# Patient Record
Sex: Female | Born: 1975
Health system: Southern US, Community
[De-identification: ages and names within clinical notes are randomized; demographics above are authoritative.]

## PROBLEM LIST (undated history)

## (undated) ENCOUNTER — Emergency Department (HOSPITAL_COMMUNITY): Admission: EM | Payer: BC Managed Care – PPO

## (undated) DIAGNOSIS — I2699 Other pulmonary embolism without acute cor pulmonale: Secondary | ICD-10-CM

## (undated) DIAGNOSIS — J45909 Unspecified asthma, uncomplicated: Secondary | ICD-10-CM

## (undated) DIAGNOSIS — R011 Cardiac murmur, unspecified: Secondary | ICD-10-CM

## (undated) DIAGNOSIS — I499 Cardiac arrhythmia, unspecified: Secondary | ICD-10-CM

## (undated) DIAGNOSIS — N051 Unspecified nephritic syndrome with focal and segmental glomerular lesions: Secondary | ICD-10-CM

## (undated) DIAGNOSIS — I10 Essential (primary) hypertension: Secondary | ICD-10-CM

## (undated) DIAGNOSIS — D649 Anemia, unspecified: Secondary | ICD-10-CM

## (undated) DIAGNOSIS — J40 Bronchitis, not specified as acute or chronic: Secondary | ICD-10-CM

## (undated) DIAGNOSIS — K219 Gastro-esophageal reflux disease without esophagitis: Secondary | ICD-10-CM

## (undated) HISTORY — DX: Cardiac murmur, unspecified: R01.1

## (undated) HISTORY — DX: Bronchitis, not specified as acute or chronic: J40

## (undated) HISTORY — PX: TUBAL LIGATION: SHX77

## (undated) HISTORY — PX: ECTOPIC PREGNANCY SURGERY: SHX613

## (undated) HISTORY — DX: Anemia, unspecified: D64.9

## (undated) HISTORY — DX: Unspecified asthma, uncomplicated: J45.909

## (undated) HISTORY — DX: Essential (primary) hypertension: I10

## (undated) HISTORY — DX: Gastro-esophageal reflux disease without esophagitis: K21.9

## (undated) HISTORY — DX: Cardiac arrhythmia, unspecified: I49.9

## (undated) HISTORY — DX: Unspecified nephritic syndrome with focal and segmental glomerular lesions: N05.1

---

## 2004-03-08 ENCOUNTER — Other Ambulatory Visit: Admission: RE | Admit: 2004-03-08 | Discharge: 2004-03-08 | Payer: Self-pay | Admitting: Obstetrics and Gynecology

## 2005-04-10 ENCOUNTER — Other Ambulatory Visit: Admission: RE | Admit: 2005-04-10 | Discharge: 2005-04-10 | Payer: Self-pay | Admitting: Obstetrics and Gynecology

## 2006-02-08 ENCOUNTER — Emergency Department (HOSPITAL_COMMUNITY): Admission: EM | Admit: 2006-02-08 | Discharge: 2006-02-08 | Payer: Self-pay | Admitting: Emergency Medicine

## 2006-02-08 IMAGING — CR DG CERVICAL SPINE COMPLETE 4+V
6 series · 6 of 6 positions shown · non-contrast
Comparison: none

CLINICAL DATA: Right arm and neck pain.  No trauma. 
 DIAGNOSTIC CERVICAL SPINE ? 6 VIEW:
 There is no evidence of cervical spine fracture or prevertebral soft tissue swelling.  Alignment is normal.  No other significant bone abnormalities are identified.

[w c-spine lat *]
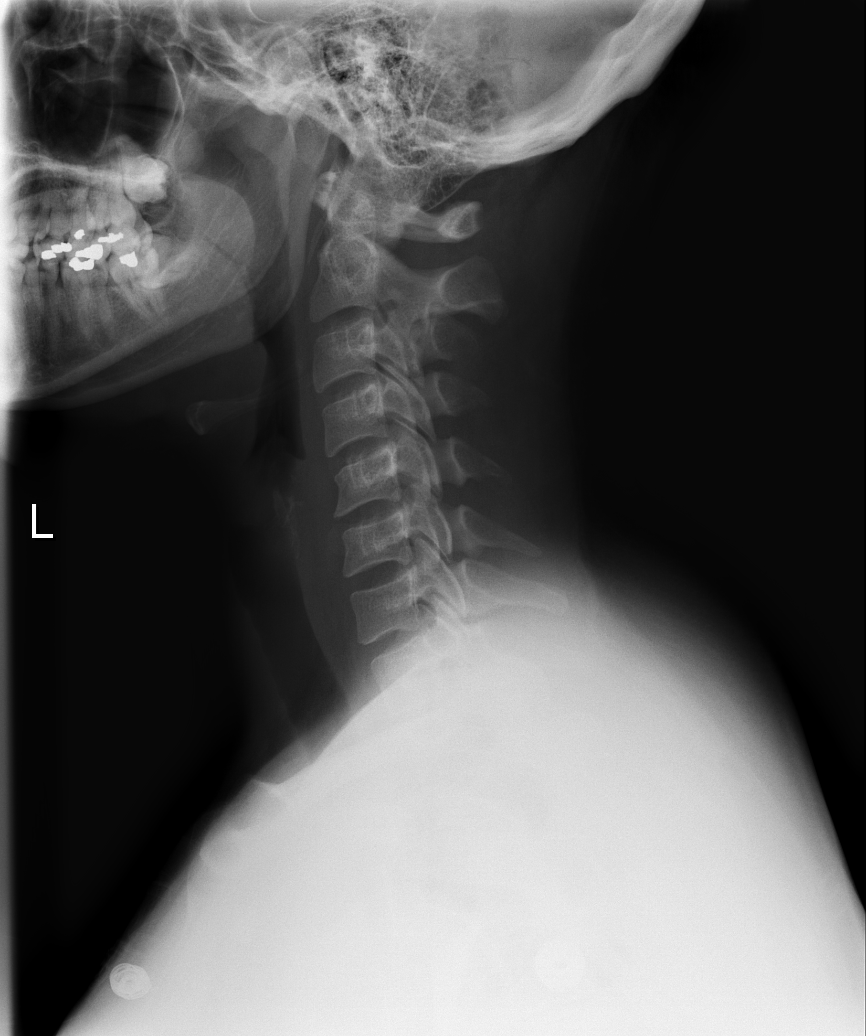

[w c-spine oblique (1 of 2)]
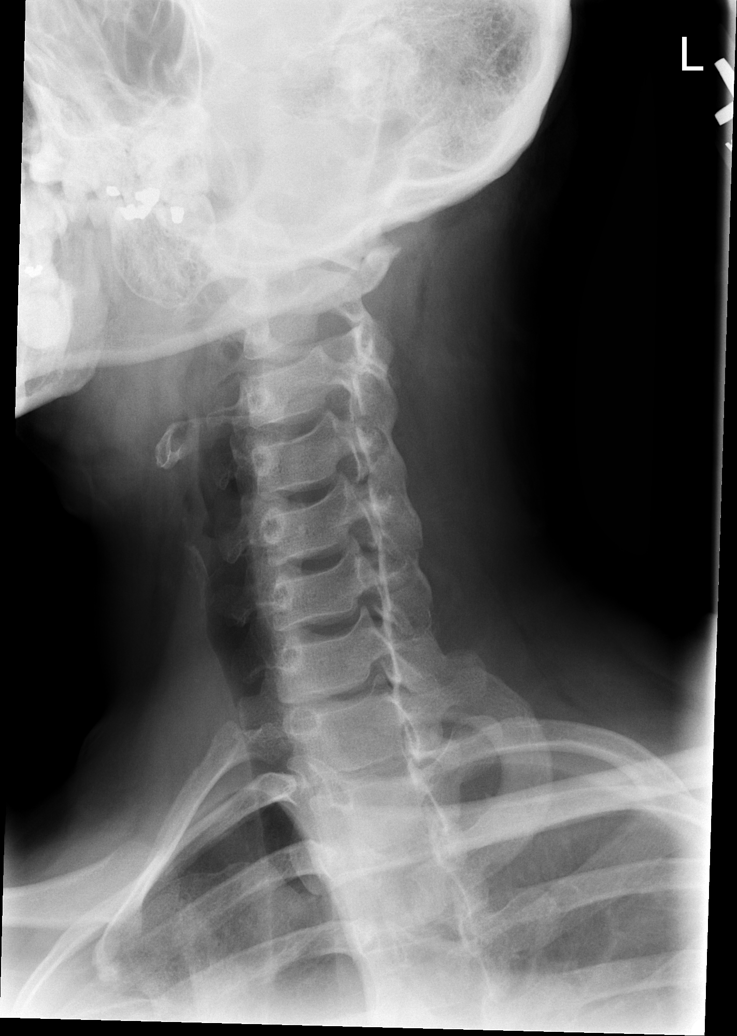

[w c-spine a.p.]
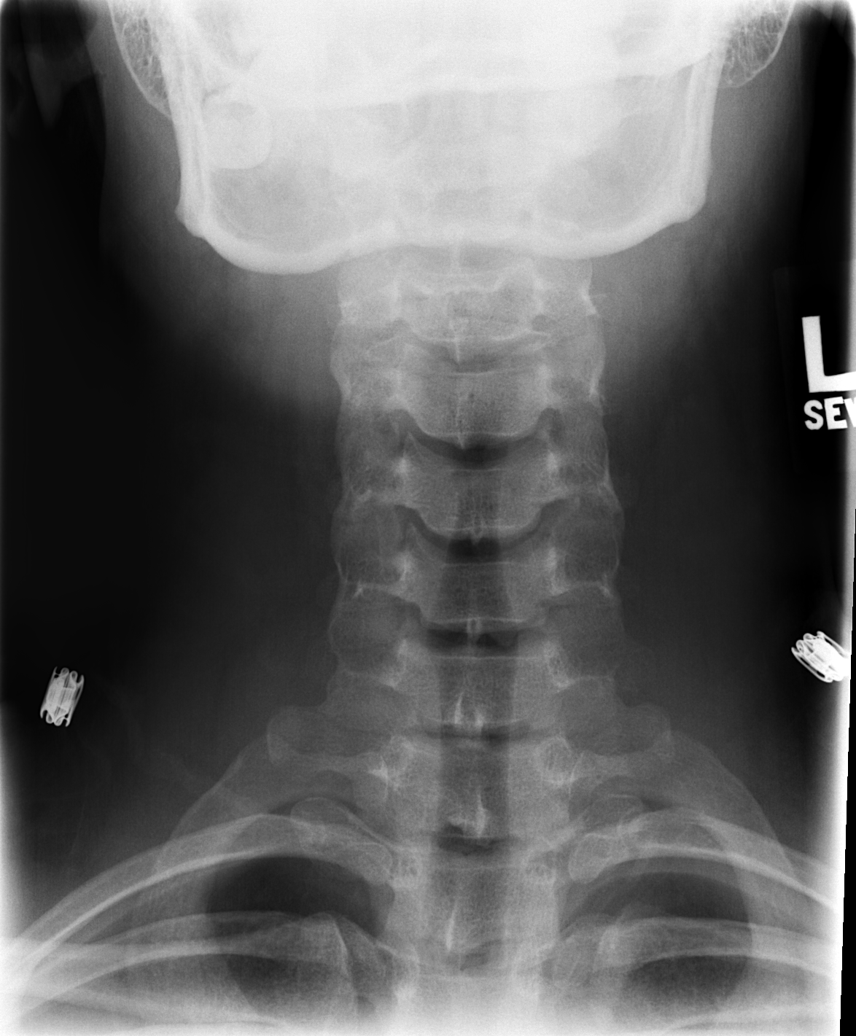

[w c-spine odontoid]
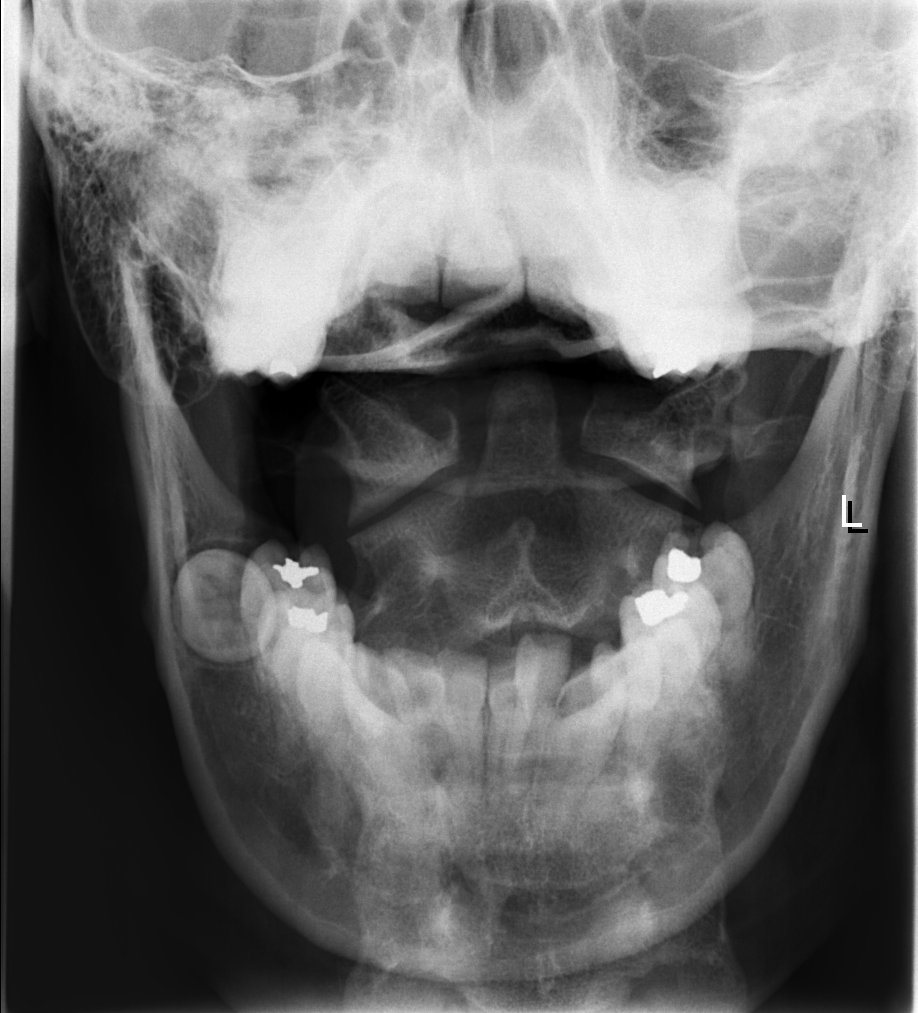

[w swimmers view]
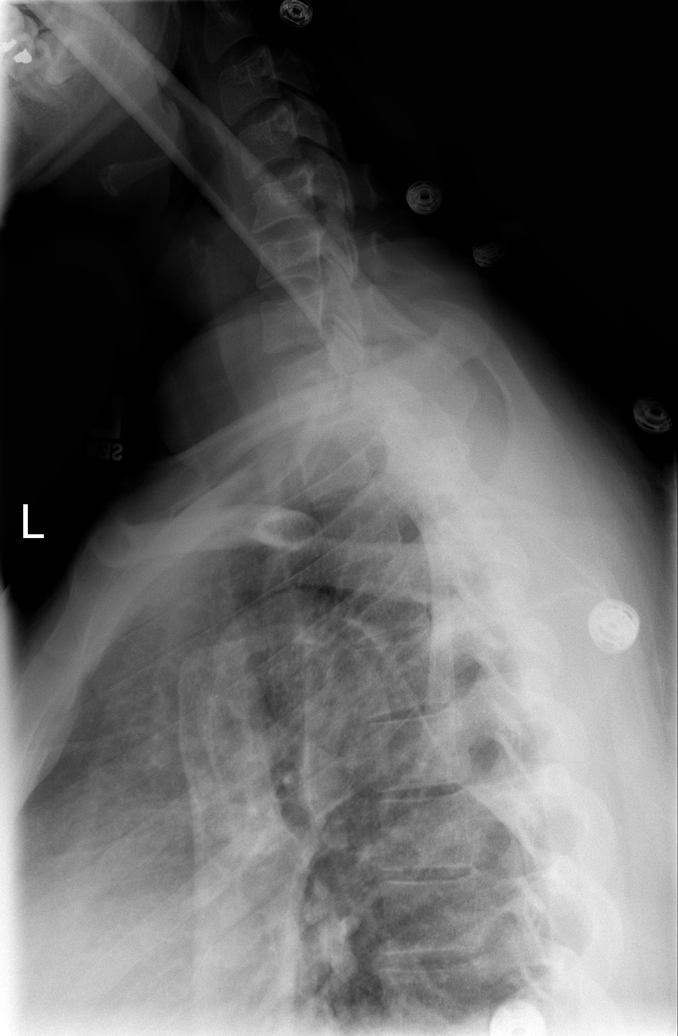

[w c-spine oblique (2 of 2)]
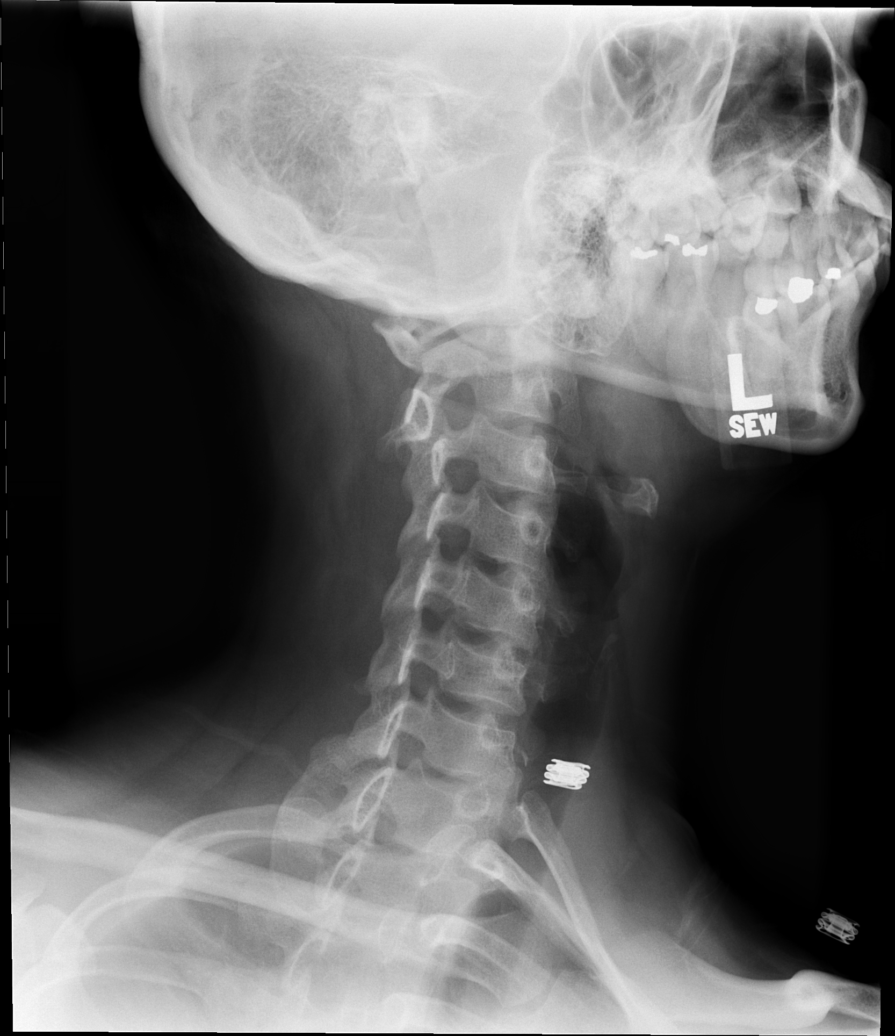

[6 of 6 positions shown; findings below may reference images not displayed]

IMPRESSION: Negative cervical spine radiographs.

## 2007-01-05 ENCOUNTER — Ambulatory Visit (HOSPITAL_COMMUNITY): Admission: RE | Admit: 2007-01-05 | Discharge: 2007-01-05 | Payer: Self-pay | Admitting: Specialist

## 2007-01-05 IMAGING — RF DG HYSTEROGRAM
4 series · 4 of 4 positions shown · IV contrast (omnipaque)
Comparison: none

CLINICAL DATA: Infertility, history of 2 ectopic pregnancies in the past. 
 HYSTEROGRAM:
 Following cleansing of the cervix and vagina with Betadine solution, a hysterosalpingogram was performed using a 5-French hysterosalpingogram catheter and Omnipaque 300 contrast.  The patient tolerated the examination without difficulty.

[Series 1: run · 1 of 1 slices shown (1 of 4)]
[im 1/1]
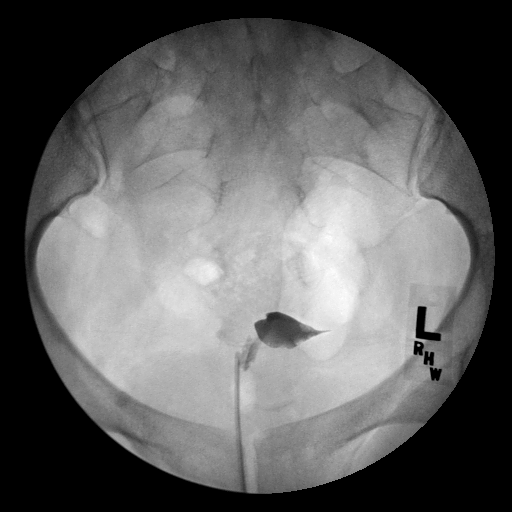

[Series 2: run · 1 of 1 slices shown (2 of 4)]
[im 1/1]
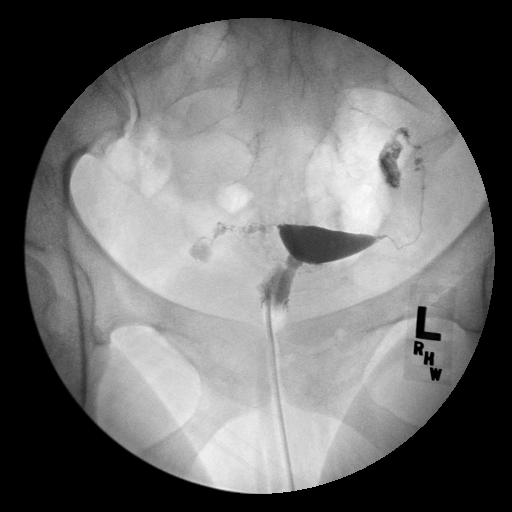

[Series 3: run · 1 of 1 slices shown (3 of 4)]
[im 1/1]
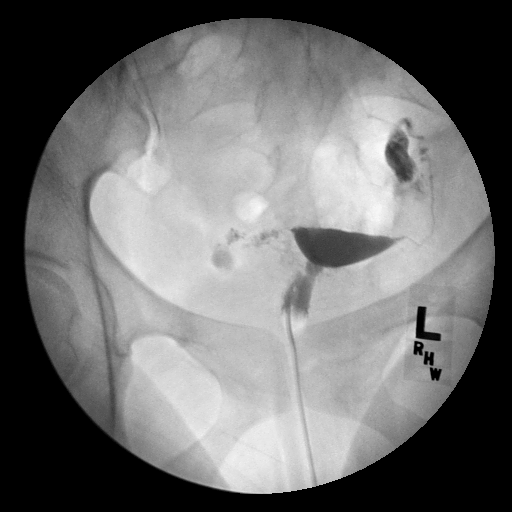

[Series 4: run · 1 of 1 slices shown (4 of 4)]
[im 1/1]
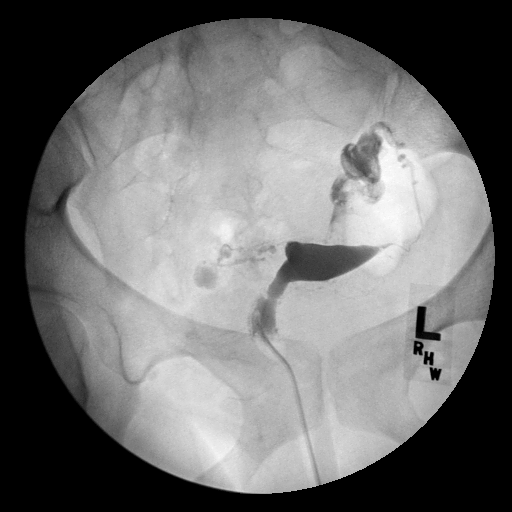

[4 of 4 positions shown; findings below may reference images not displayed]

FINDINGS: The uterus has a normal appearance with no fixed filling defects.  Both fallopian tubes exhibit small diverticula, consistent with salpingitis isthmica nodosa.  There is some spill of contrast from the left fallopian tube, but this does not flow freely and appears to be loculated around the fimbrial portion of the tube.  There is no free spill of contrast from the right tube.
IMPRESSION: 1.  Normal uterus.  
 2.  Salpingitis isthmica nodosa involving both fallopian tubes.  
 3.  The right tube appears to be blocked in its mid segment.  
 4.  There is spill of contrast from the left tube but it is loculated around the fimbrial portion, possibly from scarring vs endometriosis.

## 2007-01-08 ENCOUNTER — Other Ambulatory Visit: Admission: RE | Admit: 2007-01-08 | Discharge: 2007-01-08 | Payer: Self-pay | Admitting: Family Medicine

## 2008-04-04 ENCOUNTER — Other Ambulatory Visit: Admission: RE | Admit: 2008-04-04 | Discharge: 2008-04-04 | Payer: Self-pay | Admitting: Gynecology

## 2008-10-10 ENCOUNTER — Inpatient Hospital Stay (HOSPITAL_COMMUNITY): Admission: AD | Admit: 2008-10-10 | Discharge: 2008-10-10 | Payer: Self-pay | Admitting: Gynecology

## 2008-10-25 ENCOUNTER — Inpatient Hospital Stay (HOSPITAL_COMMUNITY): Admission: AD | Admit: 2008-10-25 | Discharge: 2008-10-25 | Payer: Self-pay | Admitting: Gynecology

## 2008-11-01 ENCOUNTER — Encounter (HOSPITAL_COMMUNITY): Payer: Self-pay | Admitting: Obstetrics and Gynecology

## 2008-11-01 ENCOUNTER — Ambulatory Visit (HOSPITAL_COMMUNITY): Admission: AD | Admit: 2008-11-01 | Discharge: 2008-11-01 | Payer: Self-pay | Admitting: Gynecology

## 2011-01-20 LAB — TYPE AND SCREEN: ABO/RH(D): O POS

## 2011-01-20 LAB — DIFFERENTIAL
Basophils Relative: 1 % (ref 0–1)
Eosinophils Relative: 1 % (ref 0–5)
Lymphocytes Relative: 31 % (ref 12–46)
Lymphs Abs: 2 10*3/uL (ref 0.7–4.0)
Monocytes Relative: 8 % (ref 3–12)
Neutrophils Relative %: 59 % (ref 43–77)

## 2011-01-20 LAB — CBC
HCT: 39.5 % (ref 36.0–46.0)
Hemoglobin: 12.7 g/dL (ref 12.0–15.0)
Hemoglobin: 12.9 g/dL (ref 12.0–15.0)
MCHC: 32.8 g/dL (ref 30.0–36.0)
MCV: 87.8 fL (ref 78.0–100.0)
MCV: 89.2 fL (ref 78.0–100.0)
Platelets: 392 10*3/uL (ref 150–400)
Platelets: 417 10*3/uL — ABNORMAL HIGH (ref 150–400)
WBC: 5.6 10*3/uL (ref 4.0–10.5)
WBC: 6.3 10*3/uL (ref 4.0–10.5)

## 2011-01-20 LAB — PROTIME-INR
INR: 1 (ref 0.00–1.49)
Prothrombin Time: 13.1 seconds (ref 11.6–15.2)

## 2011-01-20 LAB — CREATININE, SERUM
Creatinine, Ser: 0.71 mg/dL (ref 0.4–1.2)
GFR calc non Af Amer: >60 mL/min (ref >60)

## 2011-01-20 LAB — BUN: BUN: 8 mg/dL (ref 6–23)

## 2011-02-18 NOTE — Op Note (Signed)
NAME:  Tiffany Hubbard, Tiffany Hubbard NO.:  [id number]   MEDICAL RECORD NO.:  [id number]          PATIENT TYPE:  AMB   LOCATION:  SDC                           FACILITY:  [facility]   PHYSICIAN:  Zelphia Cairo, MD    DATE OF BIRTH:  10-07-1975   DATE OF PROCEDURE:  DATE OF DISCHARGE:                               OPERATIVE REPORT   DATE OF PROCEDURE:  November 01, 2008   PREOPERATIVE DIAGNOSIS:  Ectopic pregnancy.   POSTOPERATIVE DIAGNOSIS:  1. Hemoperitoneum.  2. Left ectopic pregnancy.  3. Fibroid uterus.   PROCEDURE:  Diagnostic laparoscopy with partial left salpingectomy.  Bilateral tubal ligation with Filshie clips.   SURGEON:  Zelphia Cairo, MD   ANESTHESIA:  General.   URINE OUTPUT:  200 mL.   BLOOD LOSS:  Minimal.   SPECIMEN:  Segment of left fallopian tube to pathology.   COMPLICATIONS:  None.   CONDITION:  To recovery room.   FINDINGS:  1. Left-sided ectopic pregnancy in the mid portion of the fallopian      tube.  2. Pedunculated fibroids, thin filmy adhesions noted surrounding the      liver and the right upper quadrant.   PROCEDURE:  Tiffany Hubbard was taken to the operating room where general  anesthesia was found to be adequate.  She was prepped and draped in  sterile fashion.  She was placed in dorsal lithotomy position using  Allen stirrups and prepped and draped in sterile fashion.  A Foley  catheter was inserted sterilely.  A bivalve speculum was then placed in  the vagina and a single-tooth tenaculum was used to grasp the anterior  lip of the cervix.  A Hulka clamp was placed on the anterior lip of the  cervix for uterine manipulation.  Single-tooth tenaculum and speculum  were removed and our attention was turned to the abdomen.  A small  infraumbilical skin incision was made with a scalpel and this was  extended bluntly to the level of the fascia using a Kelly clamp.  An  optical trocar was then inserted under direct visualization.  Once  intraperitoneal placement was confirmed, CO2 was turned on and the  abdomen and pelvis were insufflated.  A survey of the abdomen and pelvis  were performed, approximately 500 mL of blood was noted in the pelvis.  She was noted to have 2 pedunculated fibroids, one in the posterior  fundal region, the other located on the left anterior uterus.  Both  approximately 2 cm in size.  The patient was then placed in  Trendelenburg position.  A suprapubic incision was made with a scalpel  and a 5-mm trocar was inserted under direct visualization.  A blunt  probe was inserted.  The right ovary and fallopian tube were visualized  and appeared normal.  She had a small amount of adhesions of the omentum  to the right pelvic sidewall.  Left fallopian tube was covered with  blood and clot, approximately 2 cm area of dilation was noted in the mid  portion of the fallopian tube.  The fimbriated end of the left fallopian  tube was encasing the left ovary and a small piece of omentum was  adhered to the left ovarian fossa and pelvic sidewall.   The EnSeal device was placed through the operative scope and a blunt  grasper was placed in the 5 mm port.  The left fallopian tube was  grasped and tented towards the midline.  Using the EnSeal, a partial  left salpingectomy was performed excising the dilated portion of  fallopian tube containing the ectopic pregnancy.  Hemostasis was  assured.  Specimen was brought through the trocar and passed off to be  sent to pathology.  The distal end of the fallopian tube was cauterized  using the EnSeal device.  The proximal portion of the fallopian tube was  occluded using a Filshie clip.  Hemostasis was again assured and the  pelvis was copiously irrigated with saline.  The right fallopian tube  was then grasped and tented outwards.  A Filshie clip was applied to the  mid isthmic area with good blanching noted at the site of application.  All instruments and trocars were  then removed from the patient's abdomen  and pelvis.  The fascia of the infraumbilical incision was closed with a  figure-of-eight suture.  The skin of both incisions were closed using 3-  0 Vicryl.  Hemostasis was assured.  Hulka clamp was removed and the  patient was taken to the recovery room in stable condition.      Zelphia Cairo, MD  Electronically Signed     GA/MEDQ  D:  11/01/2008  T:  11/02/2008  Job:  [id number]

## 2011-11-28 ENCOUNTER — Encounter (HOSPITAL_COMMUNITY): Payer: Self-pay | Admitting: *Deleted

## 2011-11-28 ENCOUNTER — Emergency Department (HOSPITAL_COMMUNITY)
Admission: EM | Admit: 2011-11-28 | Discharge: 2011-11-28 | Disposition: A | Discharge status: Home / Self Care | Payer: BC Managed Care – PPO | Attending: Emergency Medicine | Admitting: Emergency Medicine

## 2011-11-28 DIAGNOSIS — R51 Headache: Secondary | ICD-10-CM | POA: Insufficient documentation

## 2011-11-28 DIAGNOSIS — H538 Other visual disturbances: Secondary | ICD-10-CM | POA: Insufficient documentation

## 2011-11-28 DIAGNOSIS — R110 Nausea: Secondary | ICD-10-CM | POA: Insufficient documentation

## 2011-11-28 MED ORDER — KETOROLAC TROMETHAMINE 60 MG/2ML IM SOLN
60.0000 mg | Freq: Once | INTRAMUSCULAR | Status: AC
  Administered 2011-11-28: 60 mg via INTRAMUSCULAR
  Filled 2011-11-28: qty 2

## 2011-11-28 MED ORDER — METOCLOPRAMIDE HCL 5 MG/ML IJ SOLN
10.0000 mg | Freq: Once | INTRAMUSCULAR | Status: AC
  Administered 2011-11-28: 10 mg via INTRAMUSCULAR
  Filled 2011-11-28: qty 2

## 2011-11-28 NOTE — Progress Notes (Signed)
22 F [employer] [place] no pcp listed Pt states pcp is Tiffany Hubbard Entered in [facility]

## 2011-11-28 NOTE — ED Notes (Signed)
Pt. Discharge to home with written and verbal instructions. Pt. Verbalized understanding. Pt.  Ambulatory with steady gait. No acute distress noted.

## 2011-11-28 NOTE — Discharge Instructions (Signed)
Continue to take ibuprofen or tylenol as needed for pain.  Follow up with your primary care doctor.  You may also wish to f/u with the neurologist if your headaches are persistent.  Please return to the ER if you have worsening pain or develop an associated fever.  Headache, General, Unknown Cause The specific cause of your headache may not have been found today. There are many causes and types of headache. A few common ones are:  Tension headache.   Migraine.   Infections (examples: dental and sinus infections).   Bone and/or joint problems in the neck or jaw.   Depression.   Eye problems.  These headaches are not life threatening.  Headaches can sometimes be diagnosed by a patient history and a physical exam. Sometimes, lab and imaging studies (such as x-ray and/or CT scan) are used to rule out more serious problems. In some cases, a spinal tap (lumbar puncture) may be requested. There are many times when your exam and tests may be normal on the first visit even when there is a serious problem causing your headaches. Because of that, it is very important to follow up with your doctor or local clinic for further evaluation. FINDING OUT THE RESULTS OF TESTS  If a radiology test was performed, a radiologist will review your results.   You will be contacted by the emergency department or your physician if any test results require a change in your treatment plan.   Not all test results may be available during your visit. If your test results are not back during the visit, make an appointment with your caregiver to find out the results. Do not assume everything is normal if you have not heard from your caregiver or the medical facility. It is important for you to follow up on all of your test results.  HOME CARE INSTRUCTIONS   Keep follow-up appointments with your caregiver, or any specialist referral.   Only take over-the-counter or prescription medicines for pain, discomfort, or fever as  directed by your caregiver.   Biofeedback, massage, or other relaxation techniques may be helpful.   Ice packs or heat applied to the head and neck can be used. Do this three to four times per day, or as needed.   Call your doctor if you have any questions or concerns.   If you smoke, you should quit.  SEEK MEDICAL CARE IF:   You develop problems with medications prescribed.   You do not respond to or obtain relief from medications.   You have a change from the usual headache.   You develop nausea or vomiting.  SEEK IMMEDIATE MEDICAL CARE IF:   If your headache becomes severe.   You have an unexplained oral temperature above 102 F (38.9 C), or as your caregiver suggests.   You have a stiff neck.   You have loss of vision.   You have muscular weakness.   You have loss of muscular control.   You develop severe symptoms different from your first symptoms.   You start losing your balance or have trouble walking.   You feel faint or pass out.  MAKE SURE YOU:   Understand these instructions.   Will watch your condition.   Will get help right away if you are not doing well or get worse.  Document Released: 09/22/2005 Document Revised: 06/04/2011 Document Reviewed: 05/11/2008 [facility] Patient Information 2012 [place], [place].

## 2011-11-28 NOTE — ED Provider Notes (Signed)
History     CSN: [id number]  Arrival date & time 11/28/11  1352   First MD Initiated Contact with Patient 11/28/11 1441      Chief Complaint  Patient presents with  . Headache  . Blurred Vision  . Nausea    (Consider location/radiation/quality/duration/timing/severity/associated sxs/prior treatment) HPI History provided by pt.   Pt has had a constant, throbbing, frontal headache since 9:30am today.  Associated w/ blurred vision and nausea; both of which have improved.  No pain relief w/ tylenol.  Denies fever, nasal congestion, rhinorrhea, ear pressure.  No recent head trauma.  Has had a headache almost every day this week.  Has never been diagnosed w/ migraines.   History reviewed. No pertinent past medical history.  Past Surgical History  Procedure Date  . Ectopic pregnancy surgery     No family history on file.  History  Substance Use Topics  . Smoking status: Never Smoker   . Smokeless tobacco: Not on file  . Alcohol Use: No    OB History    Grav Para Term Preterm Abortions TAB SAB Ect Mult Living                  Review of Systems  All other systems reviewed and are negative.    Allergies  Review of patient's allergies indicates no known allergies.  Home Medications   Current Outpatient Rx  Name Route Sig Dispense Refill  . ACETAMINOPHEN ER 650 MG PO TBCR Oral Take 650 mg by mouth every 8 (eight) hours as needed. pain      BP 148/100  Pulse 84  Temp(Src) 98.2 F (36.8 C) (Oral)  Resp 16  Ht 5\' 5"  (1.651 m)  Wt 225 lb (102.059 kg)  BMI 37.44 kg/m2  SpO2 100%  LMP 11/09/2011  Physical Exam  Nursing note and vitals reviewed. Constitutional: She is oriented to person, place, and time. She appears well-developed and well-nourished.       Well-appearing.  NAD.   HENT:  Head: Normocephalic and atraumatic.  Eyes:       Normal appearance  Neck: Normal range of motion. Neck supple. No rigidity. No Brudzinski's sign and no Kernig's sign noted.    Cardiovascular: Normal rate and regular rhythm.   Pulmonary/Chest: Effort normal and breath sounds normal.  Neurological: She is alert and oriented to person, place, and time. No cranial nerve deficit or sensory deficit. Coordination normal.       5/5 and equal upper and lower extremity strength.  No past pointing.    Skin: Skin is warm and dry. No rash noted.  Psychiatric: She has a normal mood and affect. Her behavior is normal.    ED Course  Procedures (including critical care time)  Labs Reviewed - No data to display No results found.   1. Headache       MDM  Healthy 36yo F presents w/ non-traumatic headache w/ associated blurred vision and nausea; both of which have improved.  Afebrile, well-appearing, no focal neuro deficits or meningeal signs on exam.  Pt receiving IM reglan for pain.  Will reassess shortly.  Pt reports that headache has improved but still a 5-6/10 on pain scale.  She does not appear uncomfortable.  Will give her 60mg  IM toradol and d/c home w/ referral to neuro for persistent sx.  She has a PCP to f/u with as well.  Return precautions including worsening pain and associated fever discussed.        Santina Evans  E Eli Adami, PA 11/28/11 [phone]

## 2011-11-28 NOTE — ED Notes (Signed)
Pt reports onset of headache since 0900 this am. Reports nausea, blurred vision. Pain located across forehead. Pain 8/10. +Photosensitivity.

## 2011-11-29 NOTE — ED Provider Notes (Signed)
Medical screening examination/treatment/procedure(s) were performed by non-physician practitioner and as supervising physician I was immediately available for consultation/collaboration.  Flint Melter, MD 11/29/11 [phone]

## 2012-06-10 ENCOUNTER — Ambulatory Visit (INDEPENDENT_AMBULATORY_CARE_PROVIDER_SITE_OTHER): Payer: BC Managed Care – PPO | Admitting: Family Medicine

## 2012-06-10 ENCOUNTER — Encounter: Payer: Self-pay | Admitting: *Deleted

## 2012-06-10 ENCOUNTER — Encounter: Payer: Self-pay | Admitting: Family Medicine

## 2012-06-10 VITALS — BP 122/76 | HR 82 | Temp 98.0°F | Ht 66.25 in | Wt 224.4 lb

## 2012-06-10 DIAGNOSIS — E010 Iodine-deficiency related diffuse (endemic) goiter: Secondary | ICD-10-CM

## 2012-06-10 DIAGNOSIS — E049 Nontoxic goiter, unspecified: Secondary | ICD-10-CM

## 2012-06-10 DIAGNOSIS — R590 Localized enlarged lymph nodes: Secondary | ICD-10-CM

## 2012-06-10 DIAGNOSIS — R599 Enlarged lymph nodes, unspecified: Secondary | ICD-10-CM

## 2012-06-10 DIAGNOSIS — Z0000 Encounter for general adult medical examination without abnormal findings: Secondary | ICD-10-CM | POA: Insufficient documentation

## 2012-06-10 LAB — TSH: TSH: 0.93 u[IU]/mL (ref 0.35–5.50)

## 2012-06-10 LAB — CBC WITH DIFFERENTIAL/PLATELET
Basophils Absolute: 0.1 10*3/uL (ref 0.0–0.1)
Eosinophils Absolute: 0.1 10*3/uL (ref 0.0–0.7)
HCT: 38.3 % (ref 36.0–46.0)
Hemoglobin: 12.1 g/dL (ref 12.0–15.0)
Lymphs Abs: 1.5 10*3/uL (ref 0.7–4.0)
MCHC: 31.6 g/dL (ref 30.0–36.0)
Monocytes Relative: 8.1 % (ref 3.0–12.0)
Platelets: 451 10*3/uL — ABNORMAL HIGH (ref 150.0–400.0)

## 2012-06-10 LAB — HEPATIC FUNCTION PANEL
AST: 27 U/L (ref 0–37)
Total Bilirubin: 0.3 mg/dL (ref 0.3–1.2)
Total Protein: 7.8 g/dL (ref 6.0–8.3)

## 2012-06-10 LAB — BASIC METABOLIC PANEL
CO2: 24 mEq/L (ref 19–32)
Chloride: 109 mEq/L (ref 96–112)
Creatinine, Ser: 0.7 mg/dL (ref 0.4–1.2)
GFR: 124.93 mL/min (ref >60.00)

## 2012-06-10 NOTE — Progress Notes (Signed)
  Subjective:    Patient ID: Tiffany Hubbard, female    DOB: 1975-10-27, 36 y.o.   MRN: [id number]  HPI New to establish.  Previous MD- [facility])  No current medical problems, no medications.  No concerns today.  Desires CPE.   Review of Systems Patient reports no vision/ hearing changes, adenopathy,fever, weight change,  persistant/recurrent hoarseness , swallowing issues, chest pain, palpitations, edema, persistant/recurrent cough, hemoptysis, dyspnea (rest/exertional/paroxysmal nocturnal), gastrointestinal bleeding (melena, rectal bleeding), abdominal pain, significant heartburn, bowel changes, GU symptoms (dysuria, hematuria, incontinence), Gyn symptoms (abnormal  bleeding, pain),  syncope, focal weakness, memory loss, numbness & tingling, skin/hair/nail changes, abnormal bruising or bleeding, anxiety, or depression.     Objective:   Physical Exam General Appearance:    Alert, cooperative, no distress, appears stated age  Head:    Normocephalic, without obvious abnormality, atraumatic  Eyes:    PERRL, conjunctiva/corneas clear, EOM's intact, fundi    benign, both eyes  Ears:    Normal TM's and external ear canals, both ears  Nose:   Nares normal, septum midline, mucosa normal, no drainage    or sinus tenderness  Throat:   Lips, mucosa, and tongue normal; teeth and gums normal  Neck:   Supple, symmetrical, trachea midline, R submandibular LN, nontender at this time;    Thyroid: generalized fullness w/out nodularity  Back:     Symmetric, no curvature, ROM normal, no CVA tenderness  Lungs:     Clear to auscultation bilaterally, respirations unlabored  Chest Wall:    No tenderness or deformity   Heart:    Regular rate and rhythm, S1 and S2 normal, no murmur, rub   or gallop  Breast Exam:    Deferred until GYN exam  Abdomen:     Soft, non-tender, bowel sounds active all four quadrants,    no masses, no organomegaly  Genitalia:    Deferred due to menses  Rectal:    Extremities:    Extremities normal, atraumatic, no cyanosis or edema  Pulses:   2+ and symmetric all extremities  Skin:   Skin color, texture, turgor normal, no rashes or lesions  Lymph nodes:   R submandibular nodal enlargement, supraclavicular, and axillary nodes normal  Neurologic:   CNII-XII intact, normal strength, sensation and reflexes    throughout          Assessment & Plan:

## 2012-06-10 NOTE — Patient Instructions (Addendum)
Schedule your pap and breast exam at your convenience We'll notify you of your lab results and make any changes if needed Someone will call you with your ultrasound appt Call with any questions or concerns Think of [place] as your home base Welcome!  We're glad to have you!!!

## 2012-06-13 LAB — VITAMIN D 1,25 DIHYDROXY
Vitamin D 1, 25 (OH)2 Total: 80 pg/mL — ABNORMAL HIGH (ref 18–72)
Vitamin D2 1, 25 (OH)2: <8 pg/mL
Vitamin D3 1, 25 (OH)2: 80 pg/mL

## 2012-06-14 ENCOUNTER — Ambulatory Visit (HOSPITAL_BASED_OUTPATIENT_CLINIC_OR_DEPARTMENT_OTHER)
Admission: RE | Admit: 2012-06-14 | Discharge: 2012-06-14 | Disposition: A | Discharge status: Home / Self Care | Payer: BC Managed Care – PPO | Source: Ambulatory Visit | Attending: Family Medicine | Admitting: Family Medicine

## 2012-06-14 DIAGNOSIS — R599 Enlarged lymph nodes, unspecified: Secondary | ICD-10-CM | POA: Insufficient documentation

## 2012-06-14 DIAGNOSIS — E049 Nontoxic goiter, unspecified: Secondary | ICD-10-CM | POA: Insufficient documentation

## 2012-06-14 DIAGNOSIS — E010 Iodine-deficiency related diffuse (endemic) goiter: Secondary | ICD-10-CM

## 2012-06-14 IMAGING — US US SOFT TISSUE HEAD/NECK
1 series · 14 of 25 positions shown · non-contrast
Comparison: None.

CLINICAL DATA: Thyromegaly

THYROID ULTRASOUND
TECHNIQUE: Ultrasound examination of the thyroid gland and adjacent
soft tissues was performed.

[Series 1: us soft tissue head/neck · 0.08mm/px · 14 of 46 slices shown]
[im 1/46]
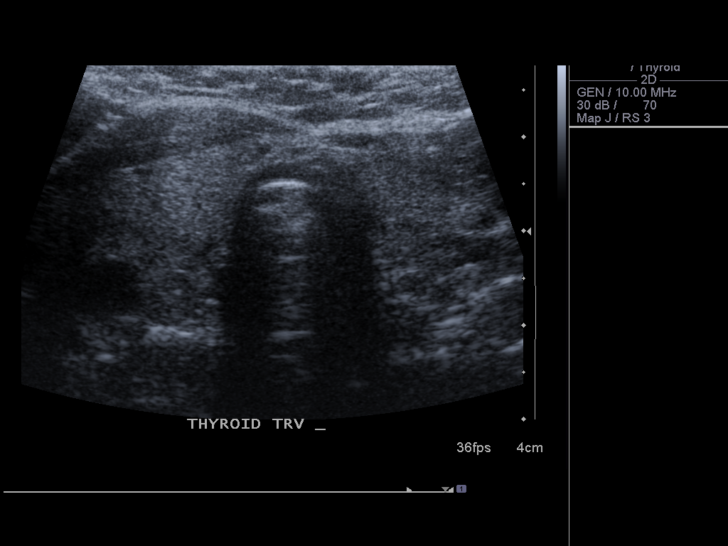
[im 4/46]
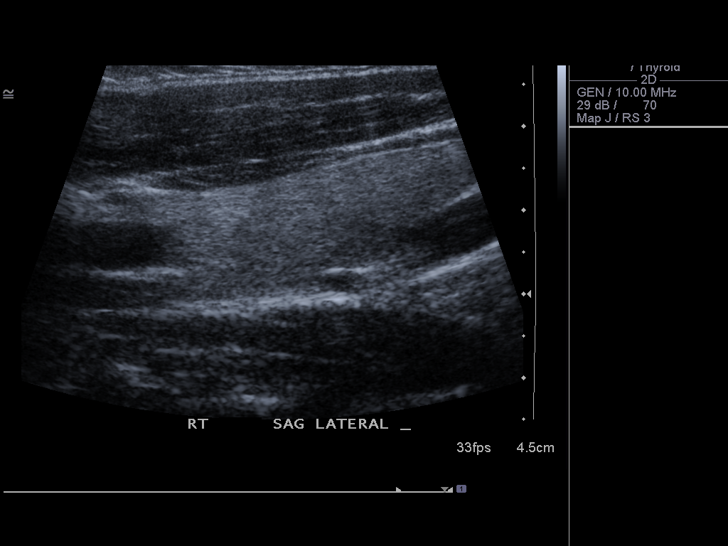
[im 8/46]
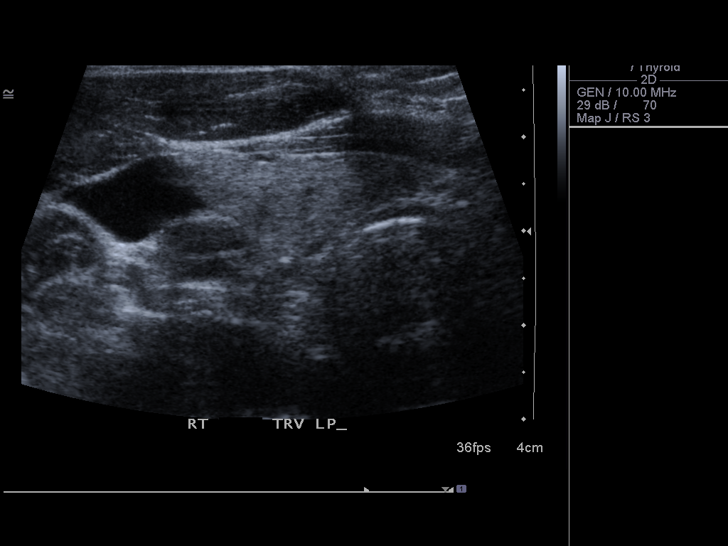
[im 12/46]
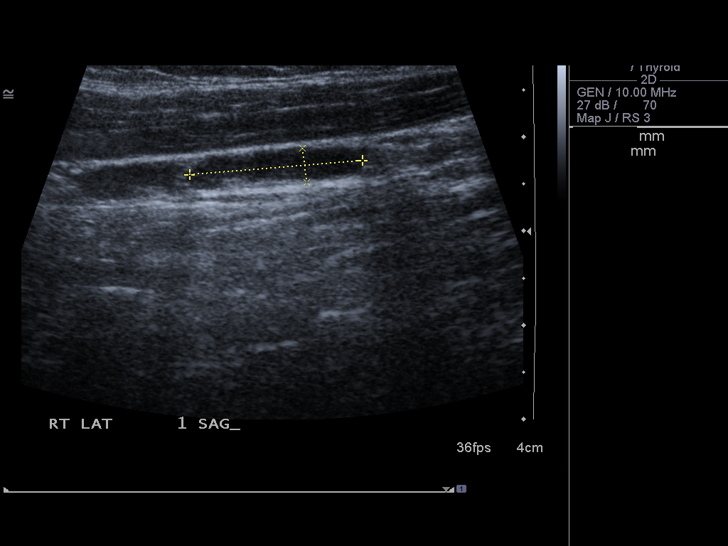
[im 16/46]
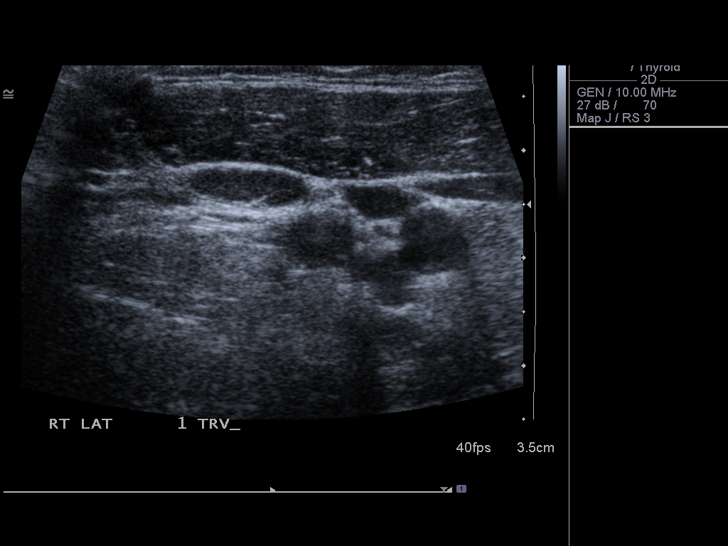
[im 17/46]
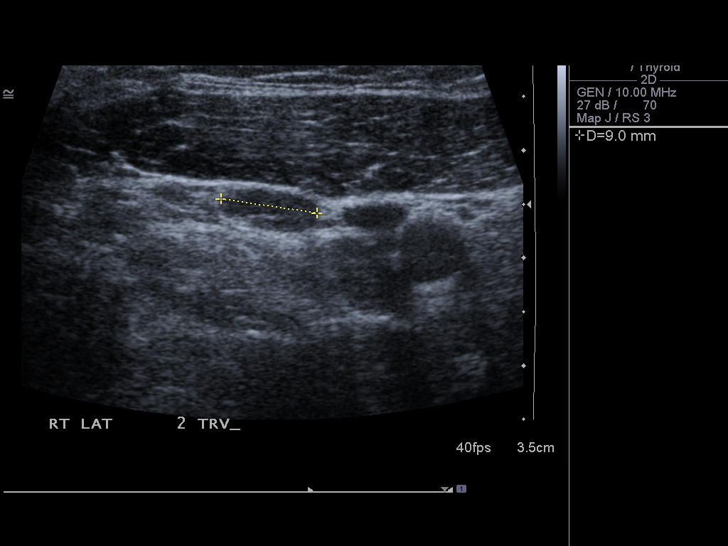
[im 21/46]
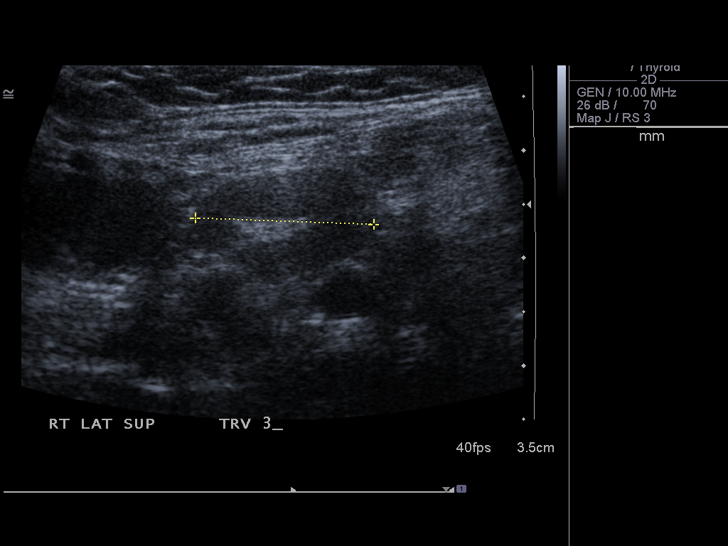
[im 25/46]
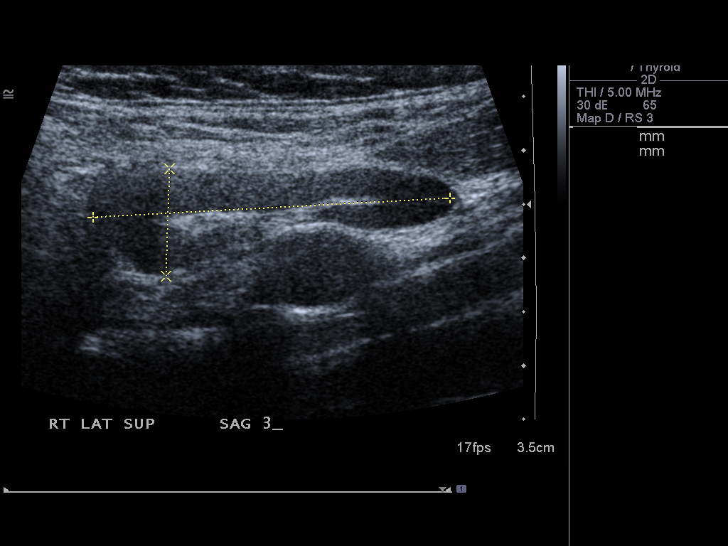
[im 29/46]
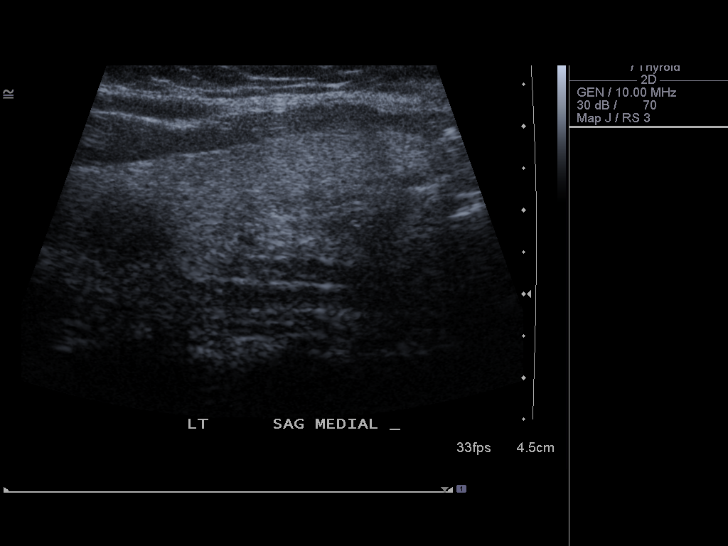
[im 31/46]
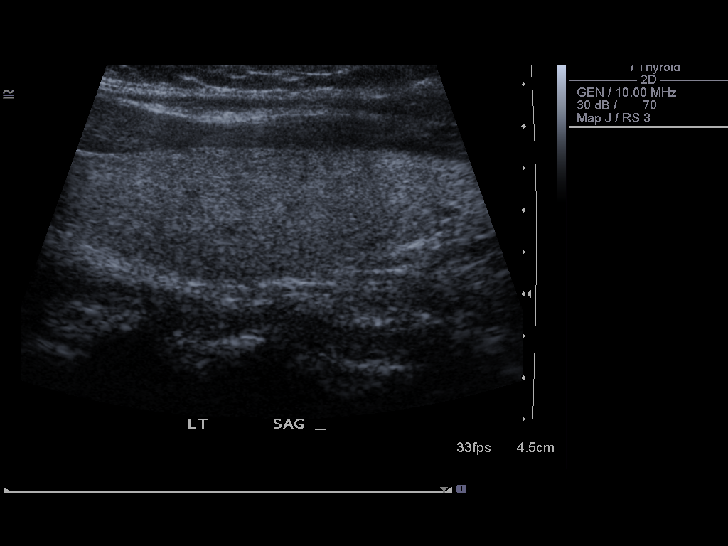
[im 34/46]
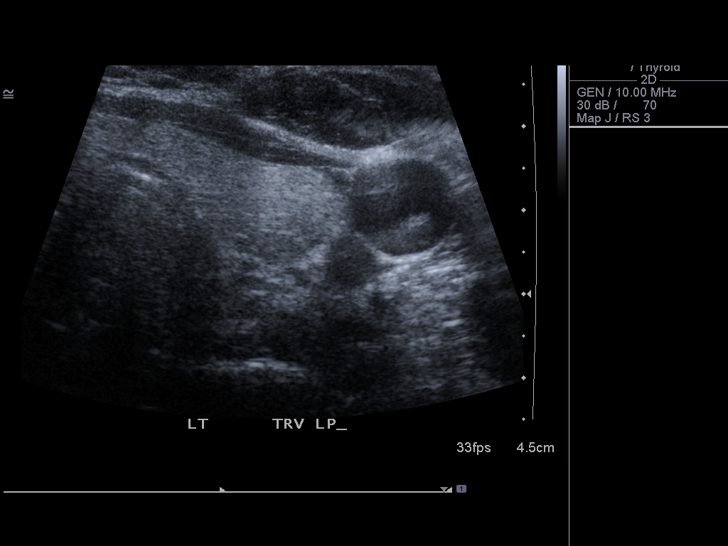
[im 38/46]
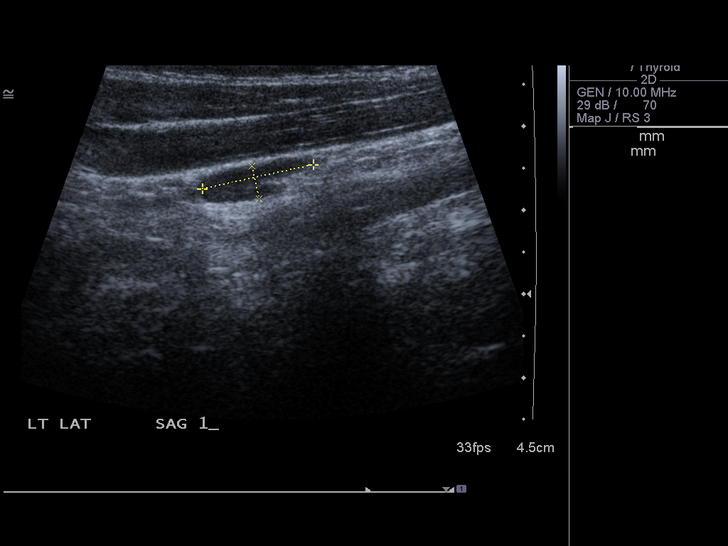
[im 42/46]
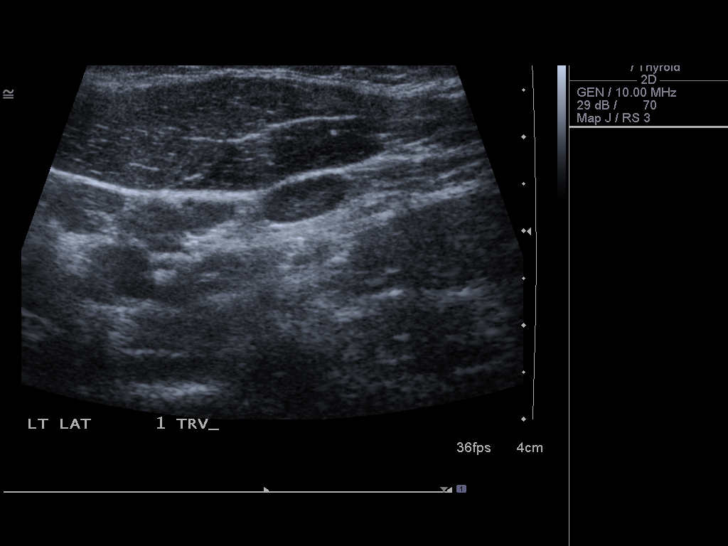
[im 46/46]
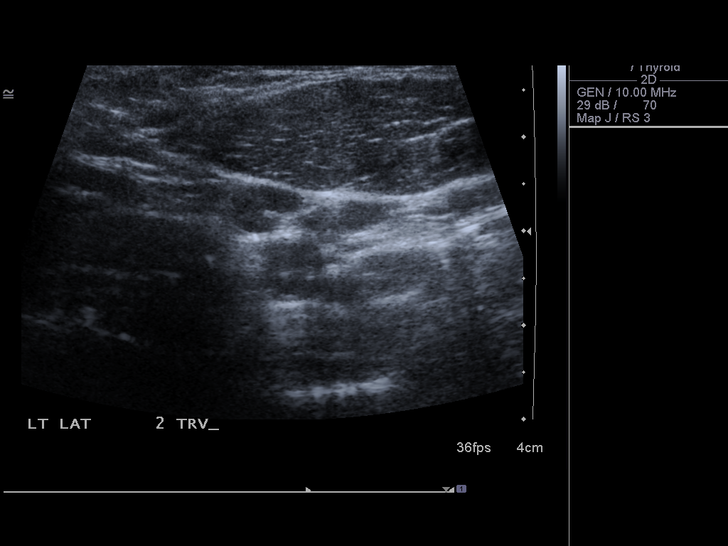

[14 of 25 positions shown; findings below may reference images not displayed]

FINDINGS: Right thyroid lobe:  16 x 17 x 51 mm, homogeneous echotexture
Left thyroid lobe:  17 x 18 x 15 mm, unremarkable
Isthmus:  5 mm thickness

Focal nodules:  None

Lymphadenopathy:  There is a single enlarged right cervical node
measuring 1 cm short axis diameter.  Sub centimeter additional
nodes are noted bilaterally.
IMPRESSION: 1.  Unremarkable thyroid.
2.  Single borderline enlarged right cervical lymph node,
nonspecific.

## 2012-06-22 NOTE — Assessment & Plan Note (Signed)
New to provider.  Pt reports chronic enlargement.  Has never been imaged.  Will get [place] and CBC to assess.  Pt expressed understanding and is in agreement w/ plan.

## 2012-06-22 NOTE — Assessment & Plan Note (Signed)
New.  Get [place] to assess and determine next steps.

## 2012-06-22 NOTE — Assessment & Plan Note (Signed)
New.  Pt's PE WNL w/ exception of thyromegaly and R submandibular LAD.  Will defer GYN exam due to menses.  Check labs.  Anticipatory guidance provided.

## 2012-06-24 ENCOUNTER — Telehealth: Payer: Self-pay | Admitting: Family Medicine

## 2012-06-24 NOTE — Telephone Encounter (Signed)
.  left message to have patient return my call per MD Beverely Low advised that if pt has noted an abnormal pap in her hx we will need the records, however if no abnormal pap was noted there will be no need for the records

## 2012-06-24 NOTE — Telephone Encounter (Signed)
Aggie Cosier from [facility] called to let us to know that this patient has not been seen since 2006. They received a records release and  would like to know if we still need her records. Per Tresa Moore, I let Aggie Cosier know to hold the release until we speak with the patient to find out if she has had any abnormal pap smear results. Aggie Cosier can be reached at [phone]

## 2012-06-29 NOTE — Telephone Encounter (Signed)
.  left message to have patient return my call.  

## 2012-06-30 NOTE — Telephone Encounter (Signed)
Pt noted that she made a mistake and should have signed the release form for [facility], not [place], advised pt she will need to come back in and sign another record release to have the records released from [place], pt understood and will come back in tomorrow to sign another release to be sent in, MD Tabori made aware verbally

## 2012-08-04 ENCOUNTER — Ambulatory Visit: Payer: BC Managed Care – PPO | Admitting: Family Medicine

## 2012-08-12 ENCOUNTER — Ambulatory Visit: Payer: BC Managed Care – PPO | Admitting: Family Medicine

## 2012-10-20 ENCOUNTER — Ambulatory Visit: Payer: BC Managed Care – PPO | Admitting: Family Medicine

## 2012-10-22 ENCOUNTER — Other Ambulatory Visit (HOSPITAL_COMMUNITY)
Admission: RE | Admit: 2012-10-22 | Discharge: 2012-10-22 | Disposition: A | Discharge status: Home / Self Care | Payer: BC Managed Care – PPO | Source: Ambulatory Visit | Attending: Family Medicine | Admitting: Family Medicine

## 2012-10-22 ENCOUNTER — Ambulatory Visit (INDEPENDENT_AMBULATORY_CARE_PROVIDER_SITE_OTHER): Payer: BC Managed Care – PPO | Admitting: Family Medicine

## 2012-10-22 ENCOUNTER — Encounter: Payer: Self-pay | Admitting: Family Medicine

## 2012-10-22 VITALS — BP 110/70 | HR 76 | Temp 98.3°F | Ht 65.75 in | Wt 241.4 lb

## 2012-10-22 DIAGNOSIS — Z01419 Encounter for gynecological examination (general) (routine) without abnormal findings: Secondary | ICD-10-CM | POA: Insufficient documentation

## 2012-10-22 DIAGNOSIS — Z124 Encounter for screening for malignant neoplasm of cervix: Secondary | ICD-10-CM | POA: Insufficient documentation

## 2012-10-22 DIAGNOSIS — Z800 Family history of malignant neoplasm of digestive organs: Secondary | ICD-10-CM | POA: Insufficient documentation

## 2012-10-22 DIAGNOSIS — Z803 Family history of malignant neoplasm of breast: Secondary | ICD-10-CM | POA: Insufficient documentation

## 2012-10-22 DIAGNOSIS — Z1231 Encounter for screening mammogram for malignant neoplasm of breast: Secondary | ICD-10-CM

## 2012-10-22 NOTE — Assessment & Plan Note (Signed)
New.  Refer for mammo as mom was dx'd in her mid 43s.

## 2012-10-22 NOTE — Assessment & Plan Note (Signed)
New.  Refer to GI for complete evaluation due to family hx.

## 2012-10-22 NOTE — Progress Notes (Signed)
  Subjective:    Patient ID: Tiffany Hubbard, female    DOB: 01-08-1976, 37 y.o.   MRN: [id number]  HPI Pap- pt had CPE done in September but was menstruating at the time so pap was deferred.  No concerns today.  No vaginal d/c, pain, concern for STDs, abnormal bleeding.  Family hx colon cancer- 2 maternal aunts  Family hx of breast cancer- mom dx'd at age 71.  Pt has never had mammo.   Review of Systems For ROS see HPI     Objective:   Physical Exam  Vitals reviewed. Constitutional: She appears well-developed and well-nourished. No distress.  Pulmonary/Chest: Right breast exhibits no inverted nipple, no mass, no nipple discharge, no skin change and no tenderness. Left breast exhibits no inverted nipple, no mass, no nipple discharge, no skin change and no tenderness. Breasts are symmetrical.  Genitourinary: Uterus normal. There is no rash, tenderness, lesion or injury on the right labia. There is no rash, tenderness, lesion or injury on the left labia. Uterus is not deviated, not enlarged, not fixed and not tender. Cervix exhibits no motion tenderness, no discharge and no friability. Right adnexum displays no mass, no tenderness and no fullness. Left adnexum displays no mass, no tenderness and no fullness. No erythema, tenderness or bleeding around the vagina. No foreign body around the vagina. No signs of injury around the vagina. No vaginal discharge found.  Lymphadenopathy:    She has no axillary adenopathy.       Right: No inguinal adenopathy present.       Left: No inguinal adenopathy present.          Assessment & Plan:

## 2012-10-22 NOTE — Assessment & Plan Note (Signed)
GYN and breast exam WNL.  Pap collected.

## 2012-10-22 NOTE — Patient Instructions (Addendum)
Follow up in 1 year or as needed We'll notify you of your pap results We'll call you with your mammo and GI appts Keep up the good work on healthy diet and regular exercise Call with any questions or concerns Happy New Year!

## 2012-10-25 ENCOUNTER — Encounter: Payer: Self-pay | Admitting: Gastroenterology

## 2012-10-27 ENCOUNTER — Encounter: Payer: Self-pay | Admitting: *Deleted

## 2012-11-15 ENCOUNTER — Ambulatory Visit: Payer: BC Managed Care – PPO | Admitting: Gastroenterology

## 2012-11-22 ENCOUNTER — Ambulatory Visit
Admission: RE | Admit: 2012-11-22 | Discharge: 2012-11-22 | Disposition: A | Discharge status: Home / Self Care | Payer: BC Managed Care – PPO | Source: Ambulatory Visit | Attending: Family Medicine | Admitting: Family Medicine

## 2012-11-22 DIAGNOSIS — Z1231 Encounter for screening mammogram for malignant neoplasm of breast: Secondary | ICD-10-CM

## 2012-11-29 ENCOUNTER — Ambulatory Visit (INDEPENDENT_AMBULATORY_CARE_PROVIDER_SITE_OTHER): Payer: BC Managed Care – PPO | Admitting: Gastroenterology

## 2012-11-29 ENCOUNTER — Encounter: Payer: Self-pay | Admitting: Gastroenterology

## 2012-11-29 VITALS — BP 110/72 | HR 80 | Ht 66.75 in | Wt 235.0 lb

## 2012-11-29 DIAGNOSIS — Z800 Family history of malignant neoplasm of digestive organs: Secondary | ICD-10-CM

## 2012-11-29 MED ORDER — PEG-KCL-NACL-NASULF-NA ASC-C 100 G PO SOLR: 1.0000 | Freq: Once | ORAL | Status: DC

## 2012-11-29 NOTE — Progress Notes (Signed)
History of Present Illness: This is a 37 year old female referred for evaluation due to family history of colon cancer. She states maternal aunt in her late 103s and a maternal uncle in his early 82s developed colon cancer. She states she has occasional diarrhea related to MSG containing foods. She has no other gastrointestinal complaints. Denies weight loss, abdominal pain, constipation, change in stool caliber, melena, hematochezia, nausea, vomiting, dysphagia, reflux symptoms, chest pain.  Review of Systems: Pertinent positive and negative review of systems were noted in the above HPI section. All other review of systems were otherwise negative.  Current Medications, Allergies, Past Medical History, Past Surgical History, Family History and Social History were reviewed in [employer] record.  Physical Exam: General: Well developed , well nourished, no acute distress Head: Normocephalic and atraumatic Eyes:  sclerae anicteric, EOMI Ears: Normal auditory acuity Mouth: No deformity or lesions Neck: Supple, no masses or thyromegaly Lungs: Clear throughout to auscultation Heart: Regular rate and rhythm; no murmurs, rubs or bruits Abdomen: Soft, non tender and non distended. No masses, hepatosplenomegaly or hernias noted. Normal Bowel sounds Rectal: Deferred to colonoscopy Musculoskeletal: Symmetrical with no gross deformities  Skin: No lesions on visible extremities Pulses:  Normal pulses noted Extremities: No clubbing, cyanosis, edema or deformities noted Neurological: Alert oriented x 4, grossly nonfocal Cervical Nodes:  No significant cervical adenopathy Inguinal Nodes: No significant inguinal adenopathy Psychological:  Alert and cooperative. Normal mood and affect  Assessment and Recommendations:  1. Family history of colon cancer, 2 distant relatives in their 58s. The risks, benefits, and alternatives to colonoscopy with possible biopsy and possible polypectomy  were discussed with the patient and they consent to proceed.

## 2012-11-29 NOTE — Patient Instructions (Addendum)
You have been scheduled for a colonoscopy with propofol. Please follow written instructions given to you at your visit today.  Please pick up your prep kit at the pharmacy within the next 1-3 days. If you use inhalers (even only as needed) or a CPAP machine, please bring them with you on the day of your procedure.  Thank you for choosing me and [facility].  Malcolm T. Stark, Jr., MD., FACG   

## 2012-12-28 ENCOUNTER — Encounter: Payer: BC Managed Care – PPO | Admitting: Gastroenterology

## 2013-01-24 ENCOUNTER — Encounter: Payer: Self-pay | Admitting: Gastroenterology

## 2013-01-24 ENCOUNTER — Telehealth: Payer: Self-pay | Admitting: Gastroenterology

## 2013-01-24 NOTE — Telephone Encounter (Signed)
Please charge for no show in [facility]

## 2013-02-01 ENCOUNTER — Ambulatory Visit: Payer: Self-pay | Admitting: Family Medicine

## 2013-10-25 ENCOUNTER — Telehealth: Payer: Self-pay

## 2013-10-25 NOTE — Telephone Encounter (Signed)
Left message for call back Non identifiable  Pap--10/2012--nml MMG--11/2012--neg

## 2013-10-27 ENCOUNTER — Encounter: Payer: BC Managed Care – PPO | Admitting: Family Medicine

## 2013-10-31 ENCOUNTER — Other Ambulatory Visit (HOSPITAL_COMMUNITY)
Admission: RE | Admit: 2013-10-31 | Discharge: 2013-10-31 | Disposition: A | Discharge status: Home / Self Care | Payer: BC Managed Care – PPO | Source: Ambulatory Visit | Attending: Family Medicine | Admitting: Family Medicine

## 2013-10-31 ENCOUNTER — Ambulatory Visit (INDEPENDENT_AMBULATORY_CARE_PROVIDER_SITE_OTHER): Payer: BC Managed Care – PPO | Admitting: Family Medicine

## 2013-10-31 ENCOUNTER — Encounter: Payer: Self-pay | Admitting: Family Medicine

## 2013-10-31 VITALS — BP 124/72 | HR 73 | Temp 98.4°F | Resp 16 | Ht 67.0 in | Wt 219.5 lb

## 2013-10-31 DIAGNOSIS — Z01419 Encounter for gynecological examination (general) (routine) without abnormal findings: Secondary | ICD-10-CM | POA: Insufficient documentation

## 2013-10-31 DIAGNOSIS — L309 Dermatitis, unspecified: Secondary | ICD-10-CM

## 2013-10-31 DIAGNOSIS — Z1151 Encounter for screening for human papillomavirus (HPV): Secondary | ICD-10-CM | POA: Insufficient documentation

## 2013-10-31 DIAGNOSIS — Z124 Encounter for screening for malignant neoplasm of cervix: Secondary | ICD-10-CM

## 2013-10-31 DIAGNOSIS — L259 Unspecified contact dermatitis, unspecified cause: Secondary | ICD-10-CM

## 2013-10-31 DIAGNOSIS — Z0000 Encounter for general adult medical examination without abnormal findings: Secondary | ICD-10-CM

## 2013-10-31 MED ORDER — TRIAMCINOLONE ACETONIDE 0.1 % EX OINT: 1.0000 "application " | TOPICAL_OINTMENT | Freq: Two times a day (BID) | CUTANEOUS | Status: DC

## 2013-10-31 NOTE — Progress Notes (Signed)
   Subjective:    Patient ID: Tiffany Hubbard, female    DOB: 11/22/1975, 38 y.o.   MRN: [id number]  HPI CPE- no concerns today.   Review of Systems Patient reports no vision/hearing changes, adenopathy,fever, weight change,  persistant/recurrent hoarseness , swallowing issues, chest pain, palpitations, edema, persistant/recurrent cough, hemoptysis, dyspnea (rest/exertional/paroxysmal nocturnal), gastrointestinal bleeding (melena, rectal bleeding), abdominal pain, significant heartburn, bowel changes, GU symptoms (dysuria, hematuria, incontinence), Gyn symptoms (abnormal  bleeding, pain),  syncope, focal weakness, memory loss, numbness & tingling, hair/nail changes, abnormal bruising or bleeding, anxiety, or depression.  Worsening eczema     Objective:   Physical Exam  General Appearance:    Alert, cooperative, no distress, appears stated age  Head:    Normocephalic, without obvious abnormality, atraumatic  Eyes:    PERRL, conjunctiva/corneas clear, EOM's intact, fundi    benign, both eyes  Ears:    Normal TM's and external ear canals, both ears  Nose:   Nares normal, septum midline, mucosa normal, no drainage    or sinus tenderness  Throat:   Lips, mucosa, and tongue normal; teeth and gums normal  Neck:   Supple, symmetrical, trachea midline, no adenopathy;    Thyroid: mild enlargement, no tenderness/nodules  Back:     Symmetric, no curvature, ROM normal, no CVA tenderness  Lungs:     Clear to auscultation bilaterally, respirations unlabored  Chest Wall:    No tenderness or deformity   Heart:    Regular rate and rhythm, S1 and S2 normal, no murmur, rub   or gallop  Breast Exam:    No tenderness, masses, or nipple abnormality  Abdomen:     Soft, non-tender, bowel sounds active all four quadrants,    no masses, no organomegaly  Genitalia:    External genitalia normal, cervix normal in appearance, no CMT, uterus in normal size and position, adnexa w/out mass or tenderness, mucosa pink  and moist, no lesions or discharge present  Rectal:    Normal external appearance  Extremities:   Extremities normal, atraumatic, no cyanosis or edema  Pulses:   2+ and symmetric all extremities  Skin:   Dry w/ diffuse hyperpigmented eczema patches  Lymph nodes:   Cervical, supraclavicular, and axillary nodes normal  Neurologic:   CNII-XII intact, normal strength, sensation and reflexes    throughout          Assessment & Plan:

## 2013-10-31 NOTE — Telephone Encounter (Signed)
Unable to reach prior to visit  

## 2013-10-31 NOTE — Progress Notes (Signed)
Pre visit review using our clinic review tool, if applicable. No additional management support is needed unless otherwise documented below in the visit note. 

## 2013-10-31 NOTE — Assessment & Plan Note (Signed)
Pt's PE WNL w/ exception of eczema and known thyromegaly.  Check labs.  Anticipatory guidance provided.

## 2013-10-31 NOTE — Assessment & Plan Note (Signed)
New- moderate to severe.  Pt has recently switched detergents and will restart dye and fragrance free option.  Start Triamcinolone prn.  Reviewed supportive care and red flags that should prompt return.  Pt expressed understanding and is in agreement w/ plan.

## 2013-10-31 NOTE — Assessment & Plan Note (Signed)
Pap collected. 

## 2013-10-31 NOTE — Patient Instructions (Signed)
Follow up in 1 year or as needed We'll notify you of your lab results and make any changes if needed Keep up the good work!  You look great! Vasoline at night Triamcinolone ointment twice daily except on face SWITCH YOUR DETERGENT!!! Call with any questions or concerns Hang in there!!!

## 2013-11-01 LAB — LIPID PANEL
Cholesterol: 172 mg/dL (ref 0–200)
HDL: 37.3 mg/dL — AB (ref >39.00)
LDL Cholesterol: 113 mg/dL — ABNORMAL HIGH (ref 0–99)
TRIGLYCERIDES: 111 mg/dL (ref 0.0–149.0)
Total CHOL/HDL Ratio: 5
VLDL: 22.2 mg/dL (ref 0.0–40.0)

## 2013-11-01 LAB — CBC WITH DIFFERENTIAL/PLATELET
Basophils Absolute: 0.1 10*3/uL (ref 0.0–0.1)
Basophils Relative: 2.2 % (ref 0.0–3.0)
EOS ABS: 0.2 10*3/uL (ref 0.0–0.7)
Eosinophils Relative: 3.2 % (ref 0.0–5.0)
HEMATOCRIT: 33.5 % — AB (ref 36.0–46.0)
Hemoglobin: 10.6 g/dL — ABNORMAL LOW (ref 12.0–15.0)
LYMPHS ABS: 2 10*3/uL (ref 0.7–4.0)
Lymphocytes Relative: 40.2 % (ref 12.0–46.0)
MCHC: 31.7 g/dL (ref 30.0–36.0)
MCV: 75.5 fl — AB (ref 78.0–100.0)
MONO ABS: 0.3 10*3/uL (ref 0.1–1.0)
Monocytes Relative: 6.3 % (ref 3.0–12.0)
Neutro Abs: 2.4 10*3/uL (ref 1.4–7.7)
Neutrophils Relative %: 48.1 % (ref 43.0–77.0)
PLATELETS: 521 10*3/uL — AB (ref 150.0–400.0)
RBC: 4.43 Mil/uL (ref 3.87–5.11)
RDW: 20.6 % — ABNORMAL HIGH (ref 11.5–14.6)
WBC: 4.9 10*3/uL (ref 4.5–10.5)

## 2013-11-01 LAB — BASIC METABOLIC PANEL
BUN: 11 mg/dL (ref 6–23)
CALCIUM: 8.9 mg/dL (ref 8.4–10.5)
CO2: 24 mEq/L (ref 19–32)
Chloride: 108 mEq/L (ref 96–112)
Creatinine, Ser: 0.7 mg/dL (ref 0.4–1.2)
GFR: 114.26 mL/min (ref >60.00)
Glucose, Bld: 75 mg/dL (ref 70–99)
POTASSIUM: 3.6 meq/L (ref 3.5–5.1)
Sodium: 138 mEq/L (ref 135–145)

## 2013-11-01 LAB — TSH: TSH: 1.73 u[IU]/mL (ref 0.35–5.50)

## 2013-11-01 LAB — HEPATIC FUNCTION PANEL
ALBUMIN: 3.8 g/dL (ref 3.5–5.2)
ALT: 22 U/L (ref 0–35)
AST: 20 U/L (ref 0–37)
Alkaline Phosphatase: 74 U/L (ref 39–117)
BILIRUBIN TOTAL: 0.4 mg/dL (ref 0.3–1.2)
Bilirubin, Direct: 0 mg/dL (ref 0.0–0.3)
Total Protein: 7.8 g/dL (ref 6.0–8.3)

## 2013-11-02 ENCOUNTER — Encounter: Payer: Self-pay | Admitting: General Practice

## 2013-11-04 ENCOUNTER — Encounter: Payer: Self-pay | Admitting: General Practice

## 2013-11-04 ENCOUNTER — Other Ambulatory Visit: Payer: Self-pay | Admitting: Family Medicine

## 2013-11-04 DIAGNOSIS — D691 Qualitative platelet defects: Secondary | ICD-10-CM

## 2013-11-04 LAB — VITAMIN D 1,25 DIHYDROXY
VITAMIN D 1, 25 (OH) TOTAL: 79 pg/mL — AB (ref 18–72)
Vitamin D2 1, 25 (OH)2: <8 pg/mL
Vitamin D3 1, 25 (OH)2: 79 pg/mL

## 2013-11-07 ENCOUNTER — Encounter: Payer: Self-pay | Admitting: General Practice

## 2013-12-05 ENCOUNTER — Other Ambulatory Visit: Payer: BC Managed Care – PPO | Admitting: Family Medicine

## 2013-12-05 ENCOUNTER — Other Ambulatory Visit: Payer: BC Managed Care – PPO

## 2014-05-06 ENCOUNTER — Encounter (HOSPITAL_COMMUNITY): Payer: Self-pay | Admitting: Emergency Medicine

## 2014-05-06 ENCOUNTER — Emergency Department (HOSPITAL_COMMUNITY)
Admission: EM | Admit: 2014-05-06 | Discharge: 2014-05-06 | Disposition: A | Discharge status: Home / Self Care | Payer: BC Managed Care – PPO | Attending: Emergency Medicine | Admitting: Emergency Medicine

## 2014-05-06 DIAGNOSIS — Z79899 Other long term (current) drug therapy: Secondary | ICD-10-CM | POA: Insufficient documentation

## 2014-05-06 DIAGNOSIS — J45909 Unspecified asthma, uncomplicated: Secondary | ICD-10-CM | POA: Insufficient documentation

## 2014-05-06 DIAGNOSIS — IMO0002 Reserved for concepts with insufficient information to code with codable children: Secondary | ICD-10-CM | POA: Insufficient documentation

## 2014-05-06 DIAGNOSIS — M542 Cervicalgia: Secondary | ICD-10-CM | POA: Insufficient documentation

## 2014-05-06 MED ORDER — IBUPROFEN 600 MG PO TABS: 600.0000 mg | ORAL_TABLET | Freq: Three times a day (TID) | ORAL | Status: DC | PRN

## 2014-05-06 MED ORDER — METHOCARBAMOL 500 MG PO TABS: 1000.0000 mg | ORAL_TABLET | Freq: Three times a day (TID) | ORAL | Status: DC | PRN

## 2014-05-06 NOTE — Discharge Instructions (Signed)
Take motrin as need for pain.  You may also try robaxin (muscle relaxer) as need for symptom relief. Try heat to sore area.  Follow up with primary care doctor in 1 week if symptoms fail to improve/resolve. Return to ER if worse, new symptoms, fevers, arm numbness/weakness, other concern.    Cervical Sprain A cervical sprain is when the tissues (ligaments) that hold the neck bones in place stretch or tear. HOME CARE   Put ice on the injured area.  Put ice in a plastic bag.  Place a towel between your skin and the bag.  Leave the ice on for 15-20 minutes, 3-4 times a day.  You may have been given a collar to wear. This collar keeps your neck from moving while you heal.  Do not take the collar off unless told by your doctor.  If you have long hair, keep it outside of the collar.  Ask your doctor before changing the position of your collar. You may need to change its position over time to make it more comfortable.  If you are allowed to take off the collar for cleaning or bathing, follow your doctor's instructions on how to do it safely.  Keep your collar clean by wiping it with mild soap and water. Dry it completely. If the collar has removable pads, remove them every 1-2 days to hand wash them with soap and water. Allow them to air dry. They should be dry before you wear them in the collar.  Do not drive while wearing the collar.  Only take medicine as told by your doctor.  Keep all doctor visits as told.  Keep all physical therapy visits as told.  Adjust your work station so that you have good posture while you work.  Avoid positions and activities that make your problems worse.  Warm up and stretch before being active. GET HELP IF:  Your pain is not controlled with medicine.  You cannot take less pain medicine over time as planned.  Your activity level does not improve as expected. GET HELP RIGHT AWAY IF:   You are bleeding.  Your stomach is upset.  You have  an allergic reaction to your medicine.  You develop new problems that you cannot explain.  You lose feeling (become numb) or you cannot move any part of your body (paralysis).  You have tingling or weakness in any part of your body.  Your symptoms get worse. Symptoms include:  Pain, soreness, stiffness, puffiness (swelling), or a burning feeling in your neck.  Pain when your neck is touched.  Shoulder or upper back pain.  Limited ability to move your neck.  Headache.  Dizziness.  Your hands or arms feel week, lose feeling, or tingle.  Muscle spasms.  Difficulty swallowing or chewing. MAKE SURE YOU:   Understand these instructions.  Will watch your condition.  Will get help right away if you are not doing well or get worse. Document Released: 03/10/2008 Document Revised: 05/25/2013 Document Reviewed: 03/30/2013 [facility] Patient Information 2015 [place], [place]. This information is not intended to replace advice given to you by your health care provider. Make sure you discuss any questions you have with your health care provider.    Heat Therapy Heat therapy can help ease sore, stiff, injured, and tight muscles and joints. Heat relaxes your muscles, which may help ease your pain.  RISKS AND COMPLICATIONS If you have any of the following conditions, do not use heat therapy unless your health care provider has  approved:  Poor circulation.  Healing wounds or scarred skin in the area being treated.  Diabetes, heart disease, or high blood pressure.  Not being able to feel (numbness) the area being treated.  Unusual swelling of the area being treated.  Active infections.  Blood clots.  Cancer.  Inability to communicate pain. This may include young children and people who have problems with their brain function (dementia).  Pregnancy. Heat therapy should only be used on old, pre-existing, or long-lasting (chronic) injuries. Do not use heat therapy on new injuries  unless directed by your health care provider. HOW TO USE HEAT THERAPY There are several different kinds of heat therapy, including:  Moist heat pack.  Warm water bath.  Hot water bottle.  Electric heating pad.  Heated gel pack.  Heated wrap.  Electric heating pad. Use the heat therapy method suggested by your health care provider. Follow your health care provider's instructions on when and how to use heat therapy. GENERAL HEAT THERAPY RECOMMENDATIONS  Do not sleep while using heat therapy. Only use heat therapy while you are awake.  Your skin may turn pink while using heat therapy. Do not use heat therapy if your skin turns red.  Do not use heat therapy if you have new pain.  High heat or long exposure to heat can cause burns. Be careful when using heat therapy to avoid burning your skin.  Do not use heat therapy on areas of your skin that are already irritated, such as with a rash or sunburn. SEEK MEDICAL CARE IF:  You have blisters, redness, swelling, or numbness.  You have new pain.  Your pain is worse. MAKE SURE YOU:  Understand these instructions.  Will watch your condition.  Will get help right away if you are not doing well or get worse. Document Released: 12/15/2011 Document Revised: 02/06/2014 Document Reviewed: 11/15/2013 [facility] Patient Information 2015 [place], [place]. This information is not intended to replace advice given to you by your health care provider. Make sure you discuss any questions you have with your health care provider.

## 2014-05-06 NOTE — ED Notes (Signed)
Pt c/o right ear and right posterior neck pain x 4 days, sts it's intermittent, denies any discharge from ear, sts thinks it's ear infection

## 2014-05-06 NOTE — ED Provider Notes (Signed)
CSN: [id number]     Arrival date & time 05/06/14  1027 History   First MD Initiated Contact with Patient 05/06/14 0840     Chief Complaint  Patient presents with  . Otalgia  . Neck Pain     (Consider location/radiation/quality/duration/timing/severity/associated sxs/prior Treatment) Patient is a 38 y.o. female presenting with ear pain and neck pain. The history is provided by the patient.  Otalgia Associated symptoms: neck pain   Associated symptoms: no congestion, no ear discharge, no fever, no headaches, no rash, no sore throat and no tinnitus   Neck Pain Associated symptoms: no chest pain, no fever, no headaches, no numbness and no weakness   pt c/o right sided neck pain posterior, from base of right scalp towards right trapezius/shoulder, for the past 3-4 days. Pt states may have slept wrong on it one night. Denies specific injury. Pain constant. Dull. Moderate, non radiating. Worse w certain movements and palpation. Pt questions whether pain could be coming from ear, but denies ear pain. No hearing loss.  Denies sore throat, nasal congestion, or other uri c/o. No fever or chills. No headaches. No radicular pain or arm numbness/weakness.     Past Medical History  Diagnosis Date  . Asthma   . Bronchitis    Past Surgical History  Procedure Laterality Date  . Ectopic pregnancy surgery     Family History  Problem Relation Age of Onset  . Lung cancer Mother   . Breast cancer Mother   . Diabetes Mother   . Heart disease Father   . Hypertension Father   . Diabetes Maternal Grandmother   . Arthritis Maternal Grandfather   . Colon cancer Maternal Aunt     2 aunts  . Colon cancer Maternal Uncle    History  Substance Use Topics  . Smoking status: Never Smoker   . Smokeless tobacco: Never Used  . Alcohol Use: Yes     Comment: occasionally   OB History   Grav Para Term Preterm Abortions TAB SAB Ect Mult Living                 Review of Systems  Constitutional: Negative  for fever and chills.  HENT: Negative for congestion, ear discharge, ear pain, sore throat and tinnitus.   Eyes: Negative for pain.  Cardiovascular: Negative for chest pain.  Musculoskeletal: Positive for neck pain.  Skin: Negative for rash.  Neurological: Negative for weakness, numbness and headaches.      Allergies  Review of patient's allergies indicates no known allergies.  Home Medications   Prior to Admission medications   Medication Sig Start Date End Date Taking? Authorizing Provider  acetaminophen (TYLENOL) 650 MG CR tablet Take 650 mg by mouth every 8 (eight) hours as needed. pain    Historical Provider, MD  Aspirin-Acetaminophen-Caffeine (GOODYS EXTRA STRENGTH PO) Take by mouth as directed.    Historical Provider, MD  fexofenadine-pseudoephedrine (ALLEGRA-D 24) 180-240 MG per 24 hr tablet Take 1 tablet by mouth daily.    Historical Provider, MD  triamcinolone ointment (KENALOG) 0.1 % Apply 1 application topically 2 (two) times daily. 10/31/13   Midge Minium, MD   BP 138/79  Pulse 70  Temp(Src) 97.9 F (36.6 C)  Resp 16  SpO2 100% Physical Exam  Nursing note and vitals reviewed. Constitutional: She is oriented to person, place, and time. She appears well-developed and well-nourished. No distress.  HENT:  Right Ear: External ear normal.  Mouth/Throat: Oropharynx is clear and moist.  Right  ear w small amount cerumen, eac non tender. No pain w movement ear, no mastoid tenderness, tm normal.   Eyes: Conjunctivae are normal. Pupils are equal, round, and reactive to light. No scleral icterus.  Neck: Normal range of motion. Neck supple. No tracheal deviation present.  Right posterior neck and trapezius muscular tenderness. No sts. No skin changes, rash or erythema.  No stiffness/rigidity.   Cardiovascular: Normal rate and intact distal pulses.   Pulmonary/Chest: Effort normal and breath sounds normal. No respiratory distress.  Abdominal: Normal appearance.   Musculoskeletal: She exhibits no edema and no tenderness.  Right post neck and trap m tenderness. C/T spine non tender, aligned, no step off. Good rom right shoulder without pain. Radial pulse 2+.  Neurological: She is alert and oriented to person, place, and time.  RUE motor intact, stre 5/5, sens intact.   Skin: Skin is warm and dry. No rash noted. She is not diaphoretic.  Psychiatric: She has a normal mood and affect.    ED Course  Procedures (including critical care time) Labs Review   MDM  Motrin po.  Exam appears most c/w musculoskeletal pain/strain. No midline bony tenderness. No sts or erythema. No fevers.   Pt appears stable for d/c.    Mirna Mires, MD 05/06/14 [phone]

## 2014-12-08 ENCOUNTER — Encounter: Payer: Self-pay | Admitting: Family Medicine

## 2014-12-13 ENCOUNTER — Encounter: Payer: Self-pay | Admitting: Family Medicine

## 2014-12-13 ENCOUNTER — Telehealth: Payer: Self-pay | Admitting: Family Medicine

## 2014-12-13 NOTE — Telephone Encounter (Signed)
PT was no show for CPE on 3/4- letter sent. Charge no show?

## 2014-12-13 NOTE — Telephone Encounter (Signed)
Yes- needs no show fee 

## 2014-12-13 NOTE — Telephone Encounter (Signed)
Charge request forwarded

## 2015-02-14 ENCOUNTER — Other Ambulatory Visit: Payer: Self-pay | Admitting: Pain Medicine

## 2015-04-30 ENCOUNTER — Other Ambulatory Visit: Payer: Self-pay | Admitting: Pain Medicine

## 2015-11-19 ENCOUNTER — Other Ambulatory Visit (HOSPITAL_COMMUNITY)
Admission: RE | Admit: 2015-11-19 | Discharge: 2015-11-19 | Disposition: A | Discharge status: Home / Self Care | Payer: BLUE CROSS/BLUE SHIELD | Source: Ambulatory Visit | Attending: Family Medicine | Admitting: Family Medicine

## 2015-11-19 ENCOUNTER — Ambulatory Visit (INDEPENDENT_AMBULATORY_CARE_PROVIDER_SITE_OTHER): Payer: BLUE CROSS/BLUE SHIELD | Admitting: Family Medicine

## 2015-11-19 ENCOUNTER — Encounter: Payer: Self-pay | Admitting: Family Medicine

## 2015-11-19 VITALS — BP 115/82 | HR 77 | Temp 97.9°F | Ht 67.0 in | Wt 233.6 lb

## 2015-11-19 DIAGNOSIS — Z0000 Encounter for general adult medical examination without abnormal findings: Secondary | ICD-10-CM

## 2015-11-19 DIAGNOSIS — E049 Nontoxic goiter, unspecified: Secondary | ICD-10-CM

## 2015-11-19 DIAGNOSIS — Z1231 Encounter for screening mammogram for malignant neoplasm of breast: Secondary | ICD-10-CM | POA: Diagnosis not present

## 2015-11-19 DIAGNOSIS — Z1151 Encounter for screening for human papillomavirus (HPV): Secondary | ICD-10-CM | POA: Diagnosis present

## 2015-11-19 DIAGNOSIS — E010 Iodine-deficiency related diffuse (endemic) goiter: Secondary | ICD-10-CM

## 2015-11-19 DIAGNOSIS — Z01419 Encounter for gynecological examination (general) (routine) without abnormal findings: Secondary | ICD-10-CM | POA: Diagnosis present

## 2015-11-19 DIAGNOSIS — Z124 Encounter for screening for malignant neoplasm of cervix: Secondary | ICD-10-CM

## 2015-11-19 MED ORDER — TRIAMCINOLONE ACETONIDE 0.1 % EX OINT: 1.0000 "application " | TOPICAL_OINTMENT | Freq: Two times a day (BID) | CUTANEOUS | Status: DC

## 2015-11-19 NOTE — Progress Notes (Signed)
   Subjective:    Patient ID: Tiffany Hubbard, female    DOB: 03/09/1976, 40 y.o.   MRN: [id number]  HPI CPE- UTD on pap (due next year) but pt prefers to do today.  Due for mammo (Breast Cancer).   Review of Systems Patient reports no vision/ hearing changes, adenopathy,fever, weight change,  persistant/recurrent hoarseness , swallowing issues, chest pain, palpitations, edema, persistant/recurrent cough, hemoptysis, dyspnea (rest/exertional/paroxysmal nocturnal), gastrointestinal bleeding (melena, rectal bleeding), abdominal pain, significant heartburn, bowel changes, GU symptoms (dysuria, hematuria, incontinence), Gyn symptoms (abnormal  bleeding, pain),  syncope, focal weakness, memory loss, numbness & tingling, skin/hair/nail changes, abnormal bruising or bleeding, anxiety, or depression.     Objective:   Physical Exam  General Appearance:    Alert, cooperative, no distress, appears stated age  Head:    Normocephalic, without obvious abnormality, atraumatic  Eyes:    PERRL, conjunctiva/corneas clear, EOM's intact, fundi    benign, both eyes  Ears:    Normal TM's and external ear canals, both ears  Nose:   Nares normal, septum midline, mucosa normal, no drainage    or sinus tenderness  Throat:   Lips, mucosa, and tongue normal; teeth and gums normal  Neck:   Supple, symmetrical, trachea midline, no adenopathy;    Thyroid: diffuse thyromegaly w/o nodularity  Back:     Symmetric, no curvature, ROM normal, no CVA tenderness  Lungs:     Clear to auscultation bilaterally, respirations unlabored  Chest Wall:    No tenderness or deformity   Heart:    Regular rate and rhythm, S1 and S2 normal, no murmur, rub   or gallop  Breast Exam:    No tenderness, masses, or nipple abnormality  Abdomen:     Soft, non-tender, bowel sounds active all four quadrants,    no masses, no organomegaly  Genitalia:    External genitalia normal, cervix normal in appearance, no CMT, uterus in normal size and  position, adnexa w/out mass or tenderness, mucosa pink and moist, no lesions or discharge present  Rectal:    Normal external appearance  Extremities:   Extremities normal, atraumatic, no cyanosis or edema  Pulses:   2+ and symmetric all extremities  Skin:   Skin color, texture, turgor normal, no rashes or lesions  Lymph nodes:   Cervical, supraclavicular, and axillary nodes normal  Neurologic:   CNII-XII intact, normal strength, sensation and reflexes    throughout          Assessment & Plan:

## 2015-11-19 NOTE — Assessment & Plan Note (Signed)
Pt's PE unchanged from previous.  Known thyromegaly.  Due for mammo- order entered.  Pap done today.  Check labs.  Stressed need for healthy diet and regular exercise.

## 2015-11-19 NOTE — Patient Instructions (Signed)
Follow up in 1 year or as needed We'll notify you of your lab results and make any changes if needed Continue to work on healthy diet and regular exercise- you can do it! We'll contact you with your mammogram and thyroid ultrasound appts Use the Triamcinolone twice daily for eczema Call with any questions or concerns If you want to join [place] at the new [place] office, any scheduled appointments will automatically transfer and we will see you at [address] [place] [address] [place], [place] [address] [facility]) Happy Valentine's Day!!!

## 2015-11-19 NOTE — Assessment & Plan Note (Signed)
Unchanged.  Pt has not had recent imaging done.  Will repeat to assess for changes.  Pt expressed understanding and is in agreement w/ plan.

## 2015-11-19 NOTE — Assessment & Plan Note (Signed)
Pap collected. 

## 2015-11-19 NOTE — Progress Notes (Signed)
Pre visit review using our clinic review tool, if applicable. No additional management support is needed unless otherwise documented below in the visit note. 

## 2015-11-20 LAB — CYTOLOGY - PAP

## 2015-11-21 ENCOUNTER — Encounter: Payer: Self-pay | Admitting: General Practice

## 2015-11-23 ENCOUNTER — Other Ambulatory Visit: Payer: Self-pay

## 2015-12-05 ENCOUNTER — Encounter: Payer: Self-pay | Admitting: Family

## 2015-12-05 ENCOUNTER — Other Ambulatory Visit (INDEPENDENT_AMBULATORY_CARE_PROVIDER_SITE_OTHER): Payer: BLUE CROSS/BLUE SHIELD

## 2015-12-05 DIAGNOSIS — D649 Anemia, unspecified: Secondary | ICD-10-CM | POA: Diagnosis not present

## 2015-12-05 LAB — CBC WITH DIFFERENTIAL/PLATELET
BASOS PCT: 1 % (ref 0.0–3.0)
Basophils Absolute: 0.1 10*3/uL (ref 0.0–0.1)
EOS ABS: 0.1 10*3/uL (ref 0.0–0.7)
EOS PCT: 1.2 % (ref 0.0–5.0)
HEMATOCRIT: 38.8 % (ref 36.0–46.0)
HEMOGLOBIN: 12.6 g/dL (ref 12.0–15.0)
Lymphocytes Relative: 33.4 % (ref 12.0–46.0)
Lymphs Abs: 1.7 10*3/uL (ref 0.7–4.0)
MCHC: 32.5 g/dL (ref 30.0–36.0)
MCV: 81.6 fl (ref 78.0–100.0)
MONO ABS: 0.4 10*3/uL (ref 0.1–1.0)
Monocytes Relative: 7.9 % (ref 3.0–12.0)
NEUTROS ABS: 2.9 10*3/uL (ref 1.4–7.7)
Neutrophils Relative %: 56.5 % (ref 43.0–77.0)
PLATELETS: 413 10*3/uL — AB (ref 150.0–400.0)
RBC: 4.76 Mil/uL (ref 3.87–5.11)
RDW: 18.2 % — AB (ref 11.5–15.5)
WBC: 5.1 10*3/uL (ref 4.0–10.5)

## 2015-12-12 ENCOUNTER — Other Ambulatory Visit: Payer: Self-pay | Admitting: General Practice

## 2015-12-12 ENCOUNTER — Telehealth: Payer: Self-pay | Admitting: Family Medicine

## 2015-12-12 MED ORDER — FEXOFENADINE-PSEUDOEPHED ER 180-240 MG PO TB24: 1.0000 | ORAL_TABLET | Freq: Every day | ORAL | Status: DC

## 2015-12-12 MED ORDER — FEXOFENADINE-PSEUDOEPHED ER 60-120 MG PO TB12: 1.0000 | ORAL_TABLET | Freq: Two times a day (BID) | ORAL | Status: DC

## 2015-12-12 NOTE — Telephone Encounter (Signed)
Relation to [id number] Call back number:[phone] Pharmacy: [facility] [place], [place]. [phone] (Phone) [phone] (Fax)         Reason for call:  Patient requesting a refill allegra D

## 2015-12-12 NOTE — Telephone Encounter (Signed)
Medication filled to pharmacy as requested.  Pt notified. 

## 2015-12-13 ENCOUNTER — Ambulatory Visit (HOSPITAL_BASED_OUTPATIENT_CLINIC_OR_DEPARTMENT_OTHER)
Admission: RE | Admit: 2015-12-13 | Discharge: 2015-12-13 | Disposition: A | Discharge status: Home / Self Care | Payer: BLUE CROSS/BLUE SHIELD | Source: Ambulatory Visit | Attending: Family Medicine | Admitting: Family Medicine

## 2015-12-13 ENCOUNTER — Ambulatory Visit (HOSPITAL_BASED_OUTPATIENT_CLINIC_OR_DEPARTMENT_OTHER): Payer: BLUE CROSS/BLUE SHIELD

## 2015-12-13 DIAGNOSIS — Z1231 Encounter for screening mammogram for malignant neoplasm of breast: Secondary | ICD-10-CM | POA: Insufficient documentation

## 2015-12-13 IMAGING — MG MM DIGITAL SCREENING BILAT W/ CAD
4 series · 4 of 4 positions shown · non-contrast
Comparison: Previous exam(s).

CLINICAL DATA: Screening.

EXAM:
DIGITAL SCREENING BILATERAL MAMMOGRAM WITH CAD

[R MLO]
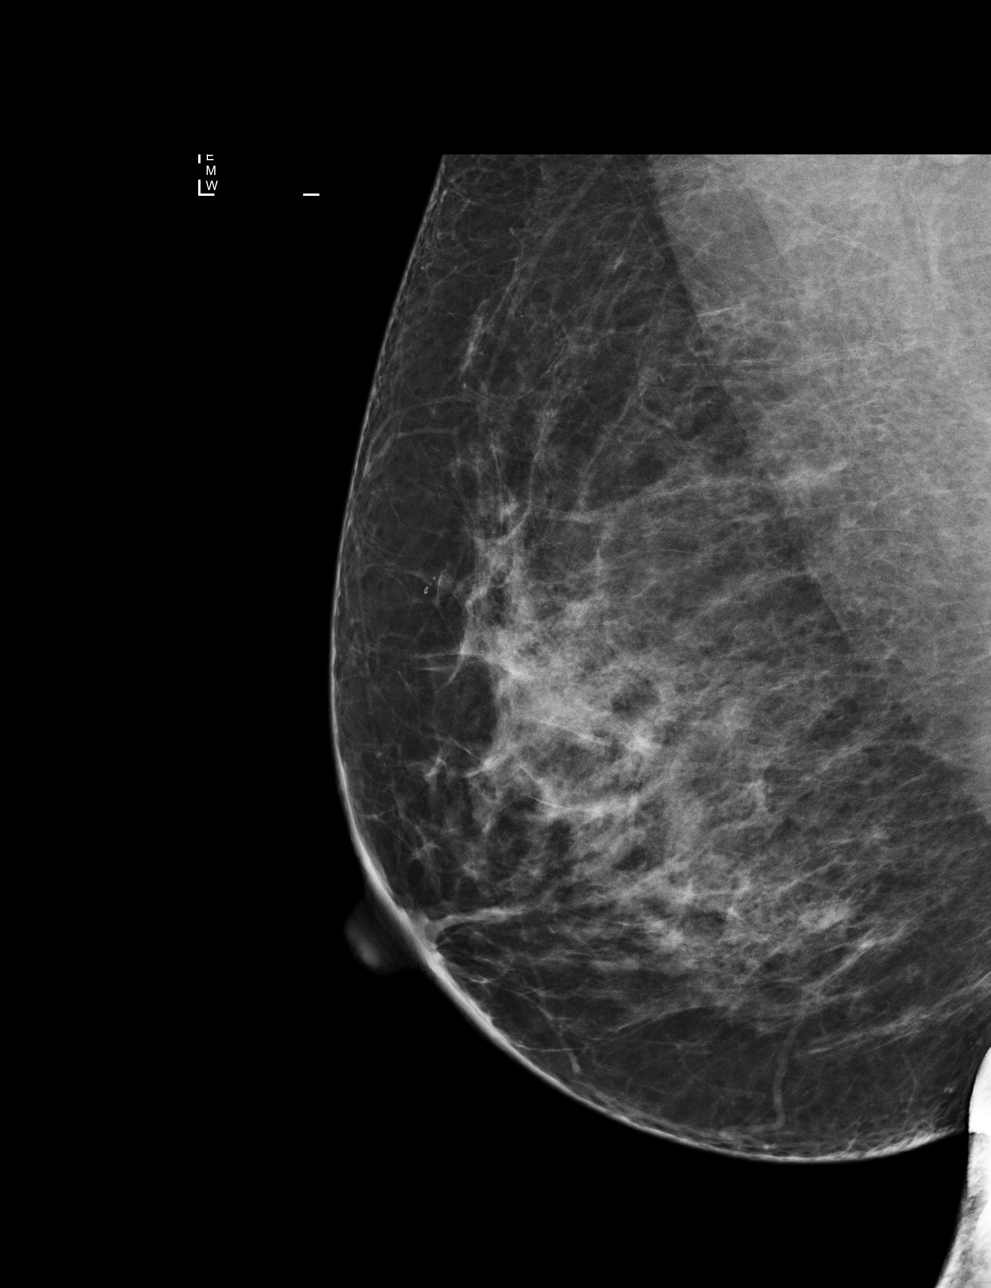

[L MLO]
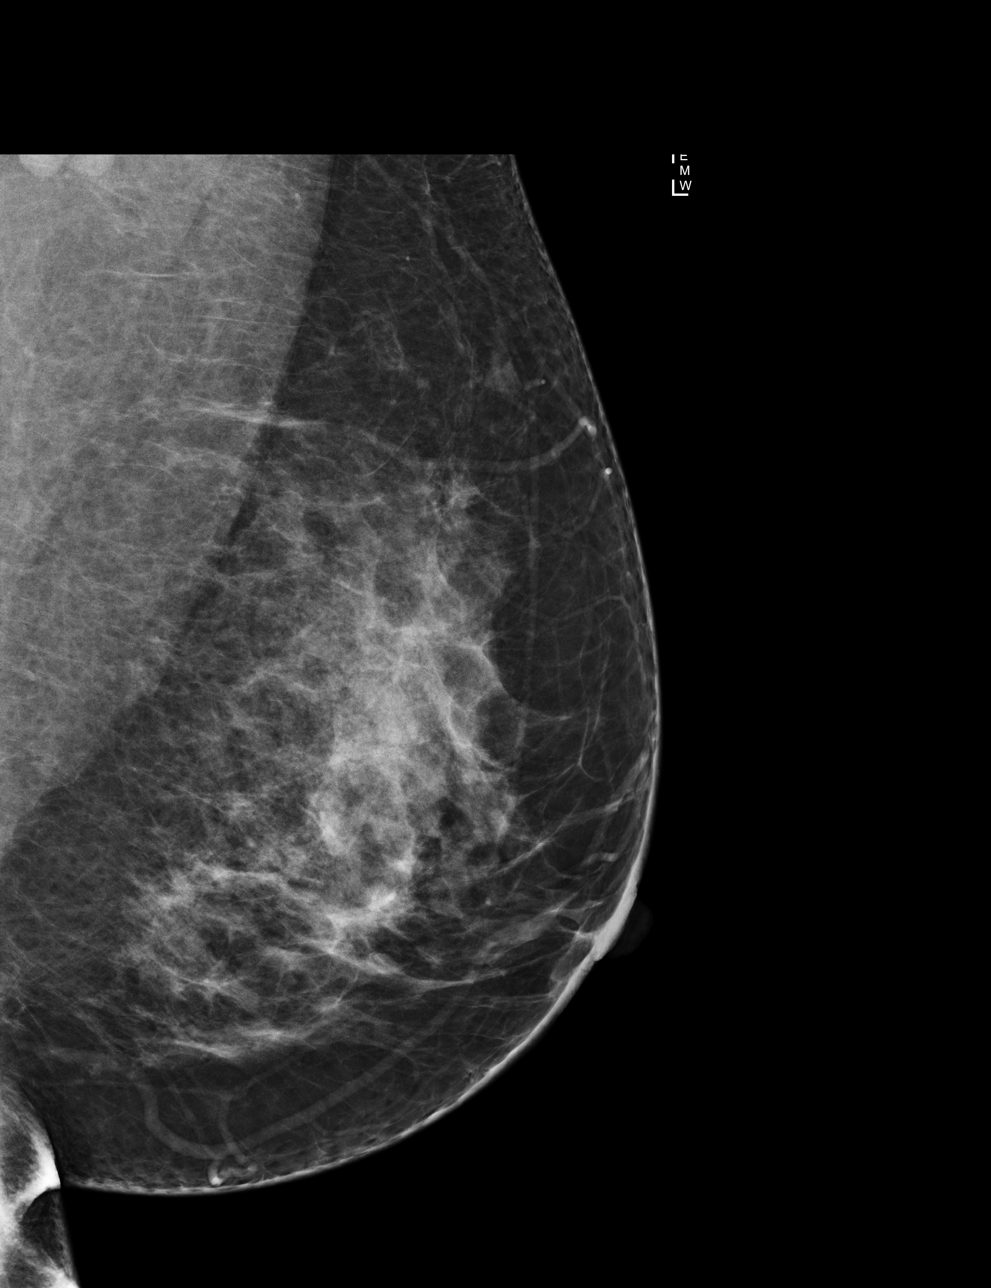

[R CC]
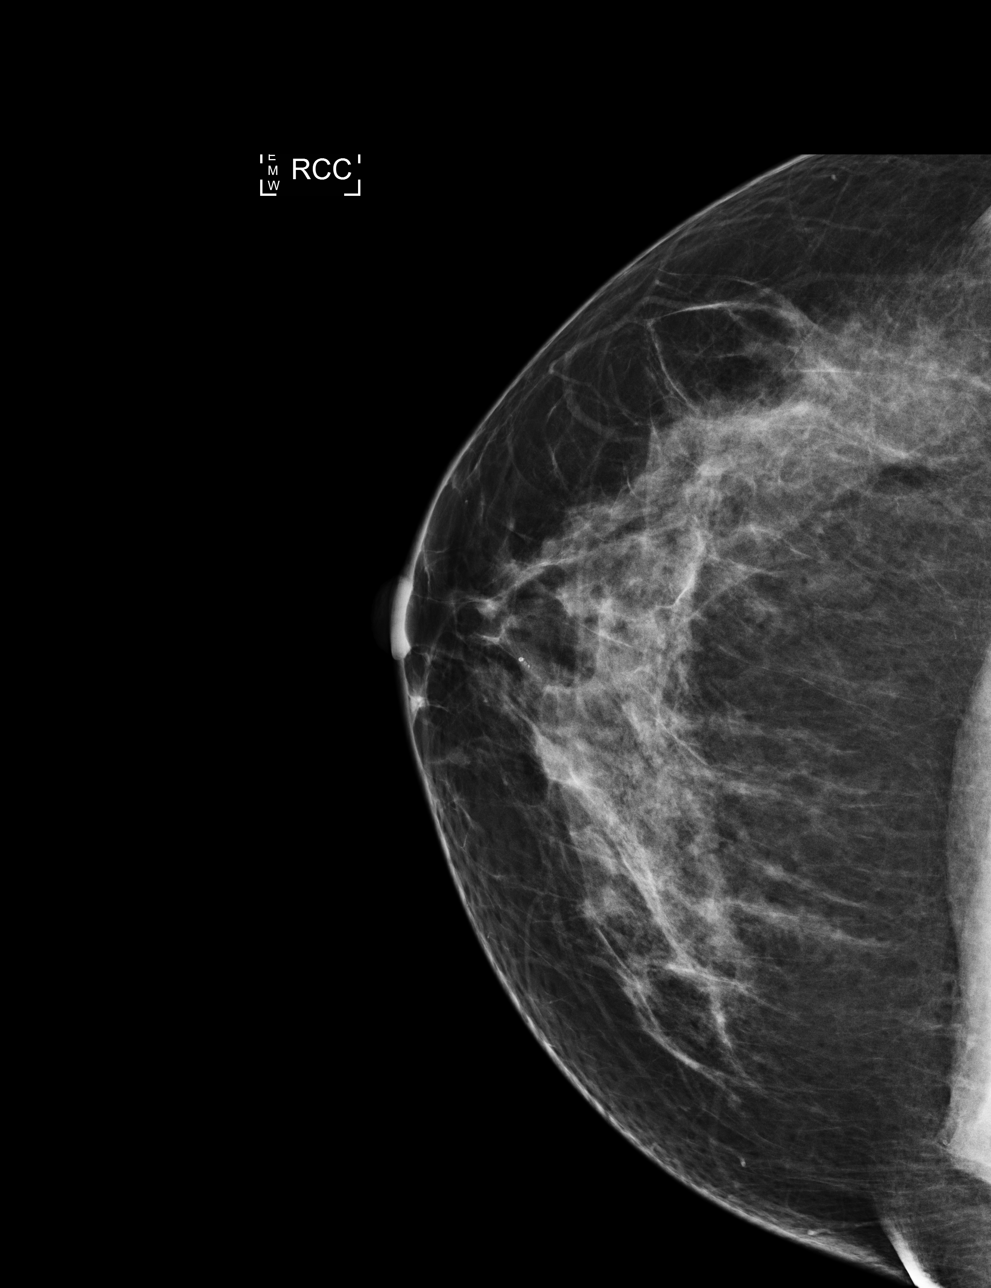

[L CC]
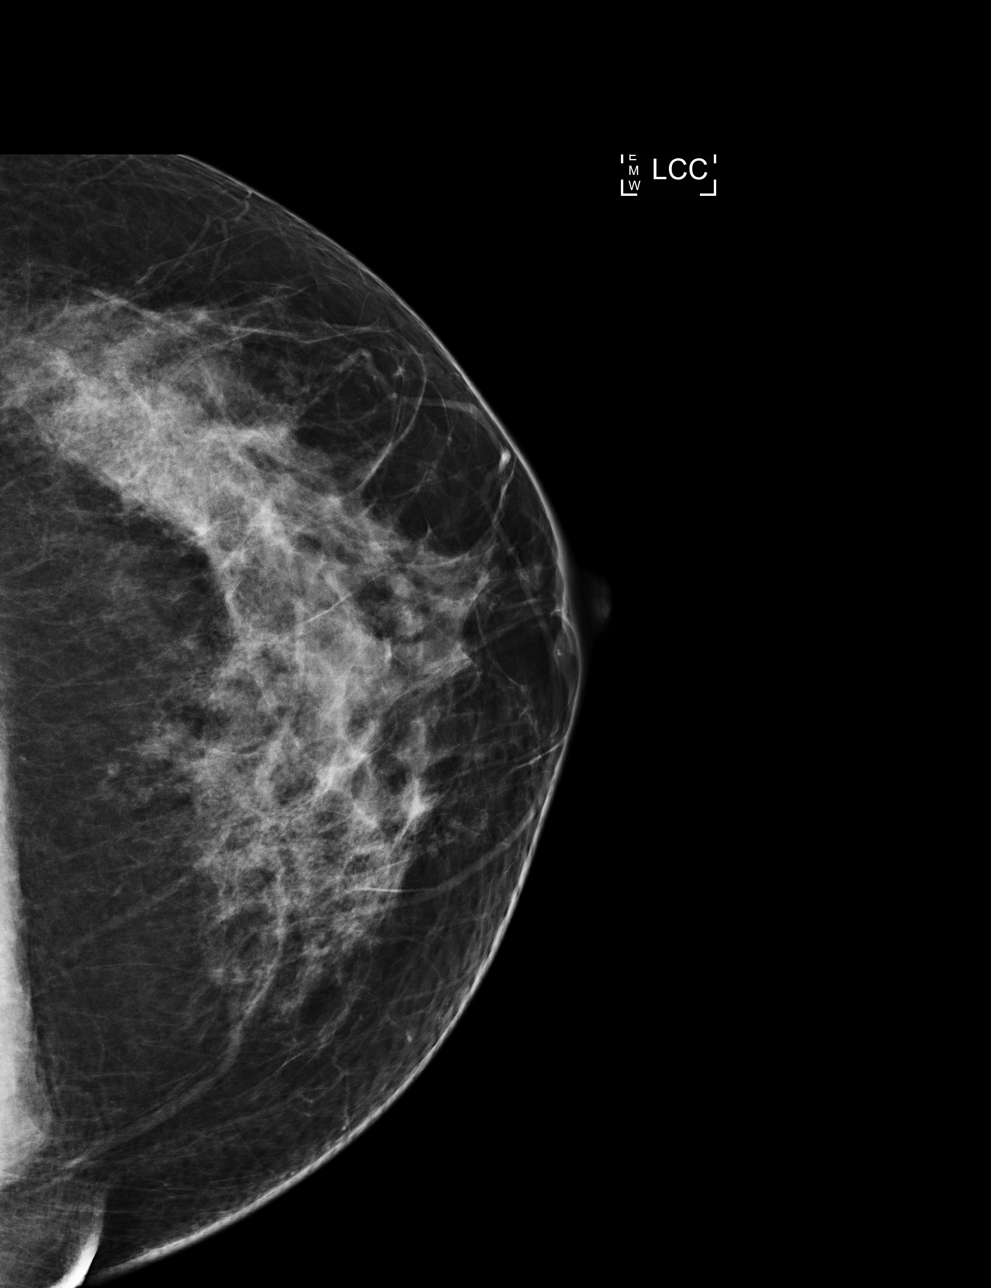

[4 of 4 positions shown; findings below may reference images not displayed]

ACR Breast Density Category b: There are scattered areas of
fibroglandular density.
FINDINGS: There are no findings suspicious for malignancy. Images were
processed with CAD.
IMPRESSION: No mammographic evidence of malignancy. A result letter of this
screening mammogram will be mailed directly to the patient.

RECOMMENDATION:
Screening mammogram in one year. (Code:[US])

BI-RADS CATEGORY  1: Negative.

## 2015-12-24 ENCOUNTER — Encounter: Payer: Self-pay | Admitting: Family Medicine

## 2015-12-24 ENCOUNTER — Ambulatory Visit (INDEPENDENT_AMBULATORY_CARE_PROVIDER_SITE_OTHER): Payer: BLUE CROSS/BLUE SHIELD | Admitting: Family Medicine

## 2015-12-24 VITALS — BP 124/70 | HR 66 | Temp 98.2°F | Ht 67.0 in

## 2015-12-24 DIAGNOSIS — R21 Rash and other nonspecific skin eruption: Secondary | ICD-10-CM | POA: Diagnosis not present

## 2015-12-24 LAB — COMPREHENSIVE METABOLIC PANEL
ALBUMIN: 4.2 g/dL (ref 3.5–5.2)
ALK PHOS: 69 U/L (ref 39–117)
ALT: 25 U/L (ref 0–35)
AST: 20 U/L (ref 0–37)
BILIRUBIN TOTAL: 0.2 mg/dL (ref 0.2–1.2)
BUN: 8 mg/dL (ref 6–23)
CO2: 26 mEq/L (ref 19–32)
CREATININE: 0.7 mg/dL (ref 0.40–1.20)
Calcium: 9.7 mg/dL (ref 8.4–10.5)
Chloride: 103 mEq/L (ref 96–112)
GFR: 118.62 mL/min (ref >60.00)
GLUCOSE: 99 mg/dL (ref 70–99)
Potassium: 3.9 mEq/L (ref 3.5–5.1)
SODIUM: 137 meq/L (ref 135–145)
TOTAL PROTEIN: 8.3 g/dL (ref 6.0–8.3)

## 2015-12-24 LAB — SEDIMENTATION RATE: Sed Rate: 22 mm/hr (ref 0–22)

## 2015-12-24 LAB — CBC
HCT: 40 % (ref 36.0–46.0)
Hemoglobin: 12.8 g/dL (ref 12.0–15.0)
MCHC: 32 g/dL (ref 30.0–36.0)
MCV: 82.8 fl (ref 78.0–100.0)
Platelets: 477 10*3/uL — ABNORMAL HIGH (ref 150.0–400.0)
RBC: 4.83 Mil/uL (ref 3.87–5.11)
RDW: 17.9 % — AB (ref 11.5–15.5)
WBC: 6.5 10*3/uL (ref 4.0–10.5)

## 2015-12-24 LAB — HIV ANTIBODY (ROUTINE TESTING W REFLEX): HIV: NONREACTIVE

## 2015-12-24 NOTE — Patient Instructions (Signed)
I think that you have pityriasis rosea- this is a benign rash that will go away on it's own Use your eczema ointment as needed, and you can certainly use benadryl at night for itching (this will make you sleepy!)  Let me know if you are not improving over the next few weeks and I will be in touch with your labs asap- we will make sure there is no sign of any other cause of your rash

## 2015-12-24 NOTE — Progress Notes (Signed)
Pre visit review using our clinic review tool, if applicable. No additional management support is needed unless otherwise documented below in the visit note. 

## 2015-12-24 NOTE — Progress Notes (Addendum)
[facility] [address], [place], [place] [address] [phone]  Date:  12/24/2015   Name:  Tiffany Hubbard   DOB:  1976-05-23   MRN:  [id number]  PCP:  Annye Asa, MD    Chief Complaint: Rash   History of Present Illness:  Tiffany Hubbard is a 40 y.o. very pleasant female patient who presents with the following:  Generally healthy young lady here today with a rash that she noted last week.  It started with a small spot on her left lower belly, and then a couple of days later she noted it spreading over her abdomen . It has continued to spread and is now all over her belly, chest, upper arms and is starting on her upper thighs She is not aware of any potential cause of this rash- her husband does not have any similar sx.  She is otherwise feeling well, no recent illness or ST.  No fevers.   The rash can be a bit itchy and irritated especially if she is active/ exercises or after exposure to hot water. She does have eczema and has triamcinolone at home  LMP 2 weeks ago  Patient Active Problem List   Diagnosis Date Noted  . Eczema 10/31/2013  . Family history of breast cancer in mother 10/22/2012  . Family history of colon cancer 10/22/2012  . Screening for malignant neoplasm of cervix 10/22/2012  . Thyromegaly 06/10/2012  . Lymphadenopathy, submandibular 06/10/2012  . Physical exam, annual 06/10/2012    Past Medical History  Diagnosis Date  . Asthma   . Bronchitis     Past Surgical History  Procedure Laterality Date  . Ectopic pregnancy surgery      Social History  Substance Use Topics  . Smoking status: Never Smoker   . Smokeless tobacco: Never Used  . Alcohol Use: Yes     Comment: occasionally    Family History  Problem Relation Age of Onset  . Lung cancer Mother   . Breast cancer Mother   . Diabetes Mother   . Heart disease Father   . Hypertension Father   . Diabetes Maternal Grandmother    . Arthritis Maternal Grandfather   . Colon cancer Maternal Aunt     2 aunts  . Colon cancer Maternal Uncle     No Known Allergies  Medication list has been reviewed and updated.  Current Outpatient Prescriptions on File Prior to Visit  Medication Sig Dispense Refill  . Aspirin-Acetaminophen-Caffeine (GOODYS EXTRA STRENGTH PO) Take 2 packets by mouth every 4 (four) hours as needed (for pain).     . fexofenadine-pseudoephedrine (ALLEGRA-D) 60-120 MG 12 hr tablet Take 1 tablet by mouth 2 (two) times daily. 30 tablet 6  . ibuprofen (ADVIL,MOTRIN) 600 MG tablet Take 1 tablet (600 mg total) by mouth every 8 (eight) hours as needed. Take with food. 20 tablet 0  . methocarbamol (ROBAXIN) 500 MG tablet Take 2 tablets (1,000 mg total) by mouth 3 (three) times daily as needed for muscle spasms. 20 tablet 0  . triamcinolone ointment (KENALOG) 0.1 % Apply 1 application topically 2 (two) times daily. 90 g 1   No current facility-administered medications on file prior to visit.    Review of Systems:  As per HPI- otherwise negative.    Physical Examination: Filed Vitals:   12/24/15 1036  BP: 124/70  Pulse: 66  Temp: 98.2 F (36.8 C)   Filed Vitals:  12/24/15 1036  Height: 5\' 7"  (1.702 m)   There is no weight on file to calculate BMI. Ideal Body Weight: Weight in (lb) to have BMI = 25: 159.3  GEN: WDWN, NAD, Non-toxic, A & O x 3, obese, looks well HEENT: Atraumatic, Normocephalic. Neck supple. No masses, No LAD.  Bilateral TM wnl, oropharynx normal.  PEERL,EOMI.   Ears and Nose: No external deformity. CV: RRR, No M/G/R. No JVD. No thrill. No extra heart sounds. PULM: CTA B, no wheezes, crackles, rhonchi. No retractions. No resp. distress. No accessory muscle use. ABD: S, NT, ND, +BS. No rebound. No HSM. EXTR: No c/c/e NEURO Normal gait.  PSYCH: Normally interactive. Conversant. Not depressed or anxious appearing.  Calm demeanor.  Densely scattered, discrete palpable papules and  macules on her trunk, shoulders, and upper arms/ upper thighs typical of pityriasis rosea  Assessment and Plan: Rash and nonspecific skin eruption - Plan: CBC, Comprehensive metabolic panel, HIV antibody, RPR, Antistreptolysin O titer, Sedimentation rate  Suspect pityriasis rosea. Discussed with pt- avoid hot water or overheating.  Will check labs to ensure no other apparent cause of her sx.  Hand- out given from [facility] clinic.  I will be in touch with her labs asap- supportive treatment in the meantime   Signed Lamar Blinks, MD  [facility] to [facility] with lab results   Called on 3/23- rash is better, allegra is helping   Results for orders placed or performed in visit on 12/24/15  CBC  Result Value Ref Range   WBC 6.5 4.0 - 10.5 K/uL   RBC 4.83 3.87 - 5.11 Mil/uL   Platelets 477.0 (H) 150.0 - 400.0 K/uL   Hemoglobin 12.8 12.0 - 15.0 g/dL   HCT 40.0 36.0 - 46.0 %   MCV 82.8 78.0 - 100.0 fl   MCHC 32.0 30.0 - 36.0 g/dL   RDW 17.9 (H) 11.5 - 15.5 %  Comprehensive metabolic panel  Result Value Ref Range   Sodium 137 135 - 145 mEq/L   Potassium 3.9 3.5 - 5.1 mEq/L   Chloride 103 96 - 112 mEq/L   CO2 26 19 - 32 mEq/L   Glucose, Bld 99 70 - 99 mg/dL   BUN 8 6 - 23 mg/dL   Creatinine, Ser 0.70 0.40 - 1.20 mg/dL   Total Bilirubin 0.2 0.2 - 1.2 mg/dL   Alkaline Phosphatase 69 39 - 117 U/L   AST 20 0 - 37 U/L   ALT 25 0 - 35 U/L   Total Protein 8.3 6.0 - 8.3 g/dL   Albumin 4.2 3.5 - 5.2 g/dL   Calcium 9.7 8.4 - 10.5 mg/dL   GFR 118.62 >60.00 mL/min  HIV antibody  Result Value Ref Range   HIV 1&2 Ab, 4th Generation NONREACTIVE NONREACTIVE  RPR  Result Value Ref Range   RPR Ser Ql NON REAC NON REAC  Antistreptolysin O titer  Result Value Ref Range   ASO 118 <=200 IU/mL  Sedimentation rate  Result Value Ref Range   Sed Rate 22 0 - 22 mm/hr

## 2015-12-25 LAB — RPR

## 2015-12-27 ENCOUNTER — Encounter: Payer: Self-pay | Admitting: Family Medicine

## 2015-12-27 LAB — ANTISTREPTOLYSIN O TITER: ASO: 118 IU/mL (ref <=200)

## 2016-08-06 ENCOUNTER — Other Ambulatory Visit: Payer: Self-pay | Admitting: Pain Medicine

## 2016-08-18 ENCOUNTER — Ambulatory Visit (INDEPENDENT_AMBULATORY_CARE_PROVIDER_SITE_OTHER): Payer: BLUE CROSS/BLUE SHIELD | Admitting: Family

## 2016-08-18 VITALS — BP 140/89 | HR 89 | Temp 98.3°F | Resp 16 | Ht 67.0 in | Wt 241.0 lb

## 2016-08-18 DIAGNOSIS — M791 Myalgia: Secondary | ICD-10-CM | POA: Diagnosis not present

## 2016-08-18 DIAGNOSIS — M7918 Myalgia, other site: Secondary | ICD-10-CM

## 2016-08-18 MED ORDER — MELOXICAM 7.5 MG PO TABS: 7.5000 mg | ORAL_TABLET | Freq: Every day | ORAL | 0 refills | Status: DC

## 2016-08-18 NOTE — Patient Instructions (Signed)
Please begin meloxicam (anti-inflammatory) once daily for the muscle pain in your right ribs. Call if new/worsening symptoms or if not improved in 1 week.

## 2016-08-18 NOTE — Progress Notes (Signed)
Subjective:    Patient ID: Tiffany Hubbard, female    DOB: 06-14-1976, 40 y.o.   MRN: [id number]  HPI  Tiffany Hubbard is a 40 yr old female who presents today with chief complaint of pain located beneath the right breast overlying the right rib cage. Has been present x 4-5 days.  Denies associated rash. When she presses on the area it feels sore.  No known injury. Denies N/V/abdominal pain or fever.    Review of Systems    see HPI  Past Medical History:  Diagnosis Date  . Asthma   . Bronchitis      Social History   Social History  . Marital status: Married    Spouse name: N/A  . Number of children: N/A  . 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 Smoking status: Never Smoker  . Smokeless tobacco: Never Used  . Alcohol use Yes     Comment: occasionally  . Drug use: No  . Sexual activity: Not on file   Other Topics Concern  . Not on file   Social History Narrative  . No narrative on file    Past Surgical History:  Procedure Laterality Date  . ECTOPIC PREGNANCY SURGERY      Family History  Problem Relation Age of Onset  . Lung cancer Mother   . Breast cancer Mother   . Diabetes Mother   . Heart disease Father   . Hypertension Father   . Diabetes Maternal Grandmother   . Arthritis Maternal Grandfather   . Colon cancer Maternal Aunt     2 aunts  . Colon cancer Maternal Uncle     No Known Allergies  Current Outpatient Prescriptions on File Prior to Visit  Medication Sig Dispense Refill  . Aspirin-Acetaminophen-Caffeine (GOODYS EXTRA STRENGTH PO) Take 2 packets by mouth every 4 (four) hours as needed (for pain).     . fexofenadine-pseudoephedrine (ALLEGRA-D) 60-120 MG 12 hr tablet Take 1 tablet by mouth 2 (two) times daily. 30 tablet 6  . ibuprofen (ADVIL,MOTRIN) 600 MG tablet Take 1 tablet (600 mg total) by mouth every 8 (eight) hours as needed. Take with food. 20 tablet 0  .  methocarbamol (ROBAXIN) 500 MG tablet Take 2 tablets (1,000 mg total) by mouth 3 (three) times daily as needed for muscle spasms. 20 tablet 0  . triamcinolone ointment (KENALOG) 0.1 % Apply 1 application topically 2 (two) times daily. 90 g 1   No current facility-administered medications on file prior to visit.     BP 140/89 (BP Location: Right Arm, Patient Position: Sitting, Cuff Size: Large)   Pulse 89   Temp 98.3 F (36.8 C) (Oral)   Resp 16   Ht 5\' 7"  (1.702 m)   Wt 241 lb (109.3 kg)   LMP 08/11/2016 (Exact Date)   SpO2 100% Comment: RA  BMI 37.75 kg/m    Objective:   Physical Exam  Constitutional: She appears well-developed and well-nourished.  Cardiovascular: Normal rate, regular rhythm and normal heart sounds.   No murmur heard. Pulmonary/Chest: Effort normal and breath sounds normal. No respiratory distress. She has no wheezes.  Abdominal: Soft. There is no tenderness.  Obese abdomen  Musculoskeletal:  Some mild tenderness to palpation overlying the right lower anterior ribs.  Skin:  No rashes.   Psychiatric: She has a normal mood and affect. Her behavior is normal. Judgment and thought content  normal.          Assessment & Plan:  Musculoskeletal pain- pain most consistent with MS pain. Advised pt as follows:  Please begin meloxicam (anti-inflammatory) once daily for the muscle pain in your right ribs. Call if new/worsening symptoms or if not improved in 1 week.

## 2016-08-18 NOTE — Progress Notes (Signed)
Pre visit review using our clinic review tool, if applicable. No additional management support is needed unless otherwise documented below in the visit note. 

## 2016-12-02 ENCOUNTER — Telehealth: Payer: Self-pay | Admitting: Family Medicine

## 2016-12-02 NOTE — Telephone Encounter (Signed)
OK 

## 2016-12-02 NOTE — Telephone Encounter (Signed)
Patient request to transfer her care from Dr. Birdie Riddle to Debbrah Alar because of distance.

## 2016-12-03 ENCOUNTER — Encounter: Payer: Self-pay | Admitting: Medical

## 2016-12-03 ENCOUNTER — Ambulatory Visit (INDEPENDENT_AMBULATORY_CARE_PROVIDER_SITE_OTHER): Payer: BLUE CROSS/BLUE SHIELD | Admitting: Medical

## 2016-12-03 VITALS — BP 145/78 | HR 77 | Temp 98.2°F | Resp 16 | Ht 67.0 in | Wt 247.5 lb

## 2016-12-03 DIAGNOSIS — R399 Unspecified symptoms and signs involving the genitourinary system: Secondary | ICD-10-CM

## 2016-12-03 DIAGNOSIS — N926 Irregular menstruation, unspecified: Secondary | ICD-10-CM | POA: Diagnosis not present

## 2016-12-03 LAB — POC URINALSYSI DIPSTICK (AUTOMATED)
BILIRUBIN UA: NEGATIVE
Glucose, UA: NEGATIVE
KETONES UA: NEGATIVE
Nitrite, UA: NEGATIVE
PH UA: 6
Protein, UA: NEGATIVE
Spec Grav, UA: >=1.03
Urobilinogen, UA: 0.2

## 2016-12-03 LAB — POCT URINE PREGNANCY: Preg Test, Ur: NEGATIVE

## 2016-12-03 MED ORDER — NITROFURANTOIN MONOHYD MACRO 100 MG PO CAPS: 100.0000 mg | ORAL_CAPSULE | Freq: Two times a day (BID) | ORAL | 0 refills | Status: DC

## 2016-12-03 NOTE — Patient Instructions (Addendum)
Your appear to have a urinary tract infection. I am prescribing macrobid antibiotic for the probable infection. Hydrate well. I am sending out a urine culture. During the interim if your signs and symptoms worsen(particularly back pain) rather than improving please notify us. We will notify your when the culture results are back.  Follow up in 7 days or as needed.

## 2016-12-03 NOTE — Telephone Encounter (Signed)
Patient informed. 

## 2016-12-03 NOTE — Progress Notes (Signed)
Subjective:    Patient ID: Tiffany Hubbard, female    DOB: 01/09/1976, 41 y.o.   MRN: [id number]  HPI   Pt in today reporting urinary symptoms x 2 days.  Dysuria- no Frequent urination- yes(urinated 7 times last night) Hesitancy- in am slight hesitant flow Suprapubic pressure- yes Fever-no chills-no Nausea-one time when vomited Vomiting-one time CVA pain-no History of UTI- pt states one uti in her life. Gross hematuria-none.  LMP- one week, came when expected.normal duration. Tubes are tied.   Review of Systems  Constitutional: Negative for chills, fatigue and fever.  Respiratory: Negative for cough, chest tightness, shortness of breath and wheezing.   Cardiovascular: Negative for chest pain and palpitations.  Gastrointestinal: Negative for abdominal pain.  Genitourinary: Positive for frequency and urgency. Negative for decreased urine volume, difficulty urinating, dysuria, flank pain, hematuria, pelvic pain and vaginal pain.  Musculoskeletal: Negative for back pain.    Past Medical History:  Diagnosis Date  . Asthma   . Bronchitis      Social History   Social History  . Marital status: Married    Spouse name: N/A  . Number of children: N/A  . 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 Smoking status: Never Smoker  . Smokeless tobacco: Never Used  . Alcohol use Yes     Comment: occasionally  . Drug use: No  . Sexual activity: Not on file   Other Topics Concern  . Not on file   Social History Narrative  . No narrative on file    Past Surgical History:  Procedure Laterality Date  . ECTOPIC PREGNANCY SURGERY      Family History  Problem Relation Age of Onset  . Lung cancer Mother   . Breast cancer Mother   . Diabetes Mother   . Heart disease Father   . Hypertension Father   . Diabetes Maternal Grandmother   . Arthritis Maternal Grandfather   . Colon cancer Maternal Aunt       2 aunts  . Colon cancer Maternal Uncle     No Known Allergies  Current Outpatient Prescriptions on File Prior to Visit  Medication Sig Dispense Refill  . Aspirin-Acetaminophen-Caffeine (GOODYS EXTRA STRENGTH PO) Take 2 packets by mouth every 4 (four) hours as needed (for pain).     . fexofenadine-pseudoephedrine (ALLEGRA-D) 60-120 MG 12 hr tablet Take 1 tablet by mouth 2 (two) times daily. 30 tablet 6  . ibuprofen (ADVIL,MOTRIN) 600 MG tablet Take 1 tablet (600 mg total) by mouth every 8 (eight) hours as needed. Take with food. 20 tablet 0  . meloxicam (MOBIC) 7.5 MG tablet Take 1 tablet (7.5 mg total) by mouth daily. 14 tablet 0  . methocarbamol (ROBAXIN) 500 MG tablet Take 2 tablets (1,000 mg total) by mouth 3 (three) times daily as needed for muscle spasms. 20 tablet 0  . triamcinolone ointment (KENALOG) 0.1 % Apply 1 application topically 2 (two) times daily. 90 g 1   No current facility-administered medications on file prior to visit.     BP (!) 145/78 (BP Location: Left Arm, Patient Position: Sitting, Cuff Size: Large)   Pulse 77   Temp 98.2 F (36.8 C) (Oral)   Resp 16   Ht 5\' 7"  (1.702 m)   Wt 247 lb 8 oz (112.3 kg)   LMP 11/26/2016   SpO2 100%   BMI 38.76 kg/m  Objective:   Physical Exam  General Appearance- Not in acute distress.  HEENT Eyes- Scleraeral/Conjuntiva-bilat- Not Yellow. Mouth & Throat- Normal.  Chest and Lung Exam Auscultation: Breath sounds:-Normal. Adventitious sounds:- No Adventitious sounds.  Cardiovascular Auscultation:Rythm - Regular. Heart Sounds -Normal heart sounds.  Abdomen Inspection:-Inspection Normal.  Palpation/Perucssion: Palpation and Percussion of the abdomen reveal- faint mid suprapubic Tender and some to rt but not andexal, No Rebound tenderness, No rigidity(Guarding) and No Palpable abdominal masses.  Liver:-Normal.  Spleen:- Normal.   Back- faint cva tenderness. Rt side.     Assessment & Plan:  Your appear  to have a urinary tract infection. I am prescribing macrobid antibiotic for the probable infection. Hydrate well. I am sending out a urine culture. During the interim if your signs and symptoms worsen(particularly back pain) rather than improving please notify us. We will notify your when the culture results are back.  Follow up in 7 days or as needed.

## 2016-12-03 NOTE — Progress Notes (Signed)
Pre visit review using our clinic review tool, if applicable. No additional management support is needed unless otherwise documented below in the visit note/SLS  

## 2016-12-03 NOTE — Telephone Encounter (Signed)
Ok to switch 

## 2016-12-05 LAB — CULTURE, URINE COMPREHENSIVE: Organism ID, Bacteria: NO GROWTH

## 2016-12-22 ENCOUNTER — Encounter: Payer: Self-pay | Admitting: Family

## 2016-12-22 ENCOUNTER — Telehealth: Payer: Self-pay | Admitting: Family Medicine

## 2016-12-22 ENCOUNTER — Encounter: Payer: BLUE CROSS/BLUE SHIELD | Admitting: Family

## 2016-12-22 NOTE — Telephone Encounter (Signed)
Noted, do not charge.

## 2016-12-22 NOTE — Telephone Encounter (Signed)
Pt lvm at 6:44a to cancel her appt. Pt says that she need to cancel her appt.

## 2017-01-09 ENCOUNTER — Encounter: Payer: Self-pay | Admitting: Family

## 2017-01-21 ENCOUNTER — Ambulatory Visit (INDEPENDENT_AMBULATORY_CARE_PROVIDER_SITE_OTHER): Payer: BLUE CROSS/BLUE SHIELD | Admitting: Family

## 2017-01-21 ENCOUNTER — Encounter: Payer: Self-pay | Admitting: Family

## 2017-01-21 VITALS — HR 67 | Temp 97.8°F | Resp 18 | Ht 66.0 in | Wt 240.6 lb

## 2017-01-21 DIAGNOSIS — Z0000 Encounter for general adult medical examination without abnormal findings: Secondary | ICD-10-CM

## 2017-01-21 DIAGNOSIS — H9190 Unspecified hearing loss, unspecified ear: Secondary | ICD-10-CM | POA: Diagnosis not present

## 2017-01-21 DIAGNOSIS — H60543 Acute eczematoid otitis externa, bilateral: Secondary | ICD-10-CM

## 2017-01-21 DIAGNOSIS — Z23 Encounter for immunization: Secondary | ICD-10-CM

## 2017-01-21 LAB — BASIC METABOLIC PANEL
BUN: 10 mg/dL (ref 6–23)
CALCIUM: 9.4 mg/dL (ref 8.4–10.5)
CHLORIDE: 106 meq/L (ref 96–112)
CO2: 26 meq/L (ref 19–32)
Creatinine, Ser: 0.73 mg/dL (ref 0.40–1.20)
GFR: 112.42 mL/min (ref >60.00)
Glucose, Bld: 76 mg/dL (ref 70–99)
Potassium: 4.4 mEq/L (ref 3.5–5.1)
SODIUM: 137 meq/L (ref 135–145)

## 2017-01-21 LAB — CBC WITH DIFFERENTIAL/PLATELET
BASOS ABS: 0 10*3/uL (ref 0.0–0.1)
Basophils Relative: 0.4 % (ref 0.0–3.0)
Eosinophils Absolute: 0.3 10*3/uL (ref 0.0–0.7)
Eosinophils Relative: 6.7 % — ABNORMAL HIGH (ref 0.0–5.0)
HEMATOCRIT: 39.8 % (ref 36.0–46.0)
HEMOGLOBIN: 12.8 g/dL (ref 12.0–15.0)
LYMPHS PCT: 37 % (ref 12.0–46.0)
Lymphs Abs: 1.8 10*3/uL (ref 0.7–4.0)
MCHC: 32.3 g/dL (ref 30.0–36.0)
MCV: 85 fl (ref 78.0–100.0)
MONOS PCT: 8.3 % (ref 3.0–12.0)
Monocytes Absolute: 0.4 10*3/uL (ref 0.1–1.0)
NEUTROS ABS: 2.3 10*3/uL (ref 1.4–7.7)
Neutrophils Relative %: 47.6 % (ref 43.0–77.0)
PLATELETS: 461 10*3/uL — AB (ref 150.0–400.0)
RBC: 4.69 Mil/uL (ref 3.87–5.11)
RDW: 15.8 % — ABNORMAL HIGH (ref 11.5–15.5)
WBC: 4.9 10*3/uL (ref 4.0–10.5)

## 2017-01-21 LAB — HEPATIC FUNCTION PANEL
ALBUMIN: 4.2 g/dL (ref 3.5–5.2)
ALK PHOS: 61 U/L (ref 39–117)
ALT: 20 U/L (ref 0–35)
AST: 17 U/L (ref 0–37)
BILIRUBIN DIRECT: 0.1 mg/dL (ref 0.0–0.3)
TOTAL PROTEIN: 7.8 g/dL (ref 6.0–8.3)
Total Bilirubin: 0.4 mg/dL (ref 0.2–1.2)

## 2017-01-21 LAB — URINALYSIS, ROUTINE W REFLEX MICROSCOPIC
BILIRUBIN URINE: NEGATIVE
Nitrite: POSITIVE — AB
Specific Gravity, Urine: 1.025 (ref 1.000–1.030)
Total Protein, Urine: >=300 — AB
Urine Glucose: 100 — AB
Urobilinogen, UA: 1 (ref 0.0–1.0)
pH: 6.5 (ref 5.0–8.0)

## 2017-01-21 LAB — LIPID PANEL
Cholesterol: 171 mg/dL (ref 0–200)
HDL: 34.2 mg/dL — ABNORMAL LOW (ref >39.00)
LDL Cholesterol: 113 mg/dL — ABNORMAL HIGH (ref 0–99)
NonHDL: 137.25
TRIGLYCERIDES: 121 mg/dL (ref 0.0–149.0)
Total CHOL/HDL Ratio: 5
VLDL: 24.2 mg/dL (ref 0.0–40.0)

## 2017-01-21 LAB — TSH: TSH: 1.86 u[IU]/mL (ref 0.35–4.50)

## 2017-01-21 MED ORDER — FEXOFENADINE-PSEUDOEPHED ER 60-120 MG PO TB12: 1.0000 | ORAL_TABLET | Freq: Two times a day (BID) | ORAL | 5 refills | Status: DC

## 2017-01-21 MED ORDER — FLUOCINOLONE ACETONIDE 0.01 % OT OIL: 2.0000 [drp] | TOPICAL_OIL | Freq: Every day | OTIC | 1 refills | Status: DC | PRN

## 2017-01-21 NOTE — Patient Instructions (Addendum)
Please complete lab work prior to leaving Work on eating 3 well balanced meals and healthier snack choices.   Increase frequency of exercise with a goal of 5 days a week.  You will be contacted about your referral for hearing testing.  You can apply fluocinolone oil 2 drops each ear once daily as needed

## 2017-01-21 NOTE — Addendum Note (Signed)
Addended by: Kelle Darting A on: 01/21/2017 01:32 PM   Modules accepted: Orders

## 2017-01-21 NOTE — Progress Notes (Signed)
Pre visit review using our clinic review tool, if applicable. No additional management support is needed unless otherwise documented below in the visit note. 

## 2017-01-21 NOTE — Progress Notes (Signed)
Subjective:    Patient ID: Tiffany Hubbard, female    DOB: July 16, 1976, 41 y.o.   MRN: [id number]  HPI  Patient presents today for complete physical.  Immunizations: >10 years Diet: frustrated with weight Wt Readings from Last 3 Encounters:  01/21/17 240 lb 9.6 oz (109.1 kg)  12/03/16 247 lb 8 oz (112.3 kg)  08/18/16 241 lb (109.3 kg)  Breakfast- coffee 2PM lunch- 1/2 sandwich, soup 7PM meat, starch veg HS- fruit snacks, cakes (cupcakes).   Exercise: once a weekPap Smear:2/17 Mammogram:3/17  Family is concerned about her hearing  Review of Systems  Constitutional: Negative for unexpected weight change.  HENT: Negative for rhinorrhea.   Eyes: Negative for visual disturbance.  Respiratory: Negative for cough.        Gets winded at times during exertion- attributes to her weight  Cardiovascular: Negative for chest pain.  Gastrointestinal: Negative for constipation.       Reports that she has chronic loose stools- at baseline  Genitourinary: Negative for dysuria, frequency and menstrual problem.  Musculoskeletal: Negative for arthralgias and myalgias.  Skin: Negative for rash.  Neurological: Negative for headaches.  Hematological: Negative for adenopathy.  Psychiatric/Behavioral:       Denies depression/anxiety       Past Medical History:  Diagnosis Date  . Asthma   . Bronchitis      Social History   Social History  . Marital status: Married    Spouse name: N/A  . Number of children: N/A  . 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 Smoking status: Never Smoker  . Smokeless tobacco: Never Used  . Alcohol use Yes     Comment: occasionally  . Drug use: No  . Sexual activity: Yes    Partners: Male    Birth control/ protection: Surgical   Other Topics Concern  . Not on file   Social History Narrative  . No narrative on file    Past Surgical History:  Procedure Laterality Date  .  ECTOPIC PREGNANCY SURGERY      Family History  Problem Relation Age of Onset  . Lung cancer Mother   . Breast cancer Mother 30  . Diabetes Mother   . Heart disease Father 22  . Hypertension Father   . Diabetes Maternal Grandmother   . Stomach cancer Maternal Grandmother   . Arthritis Maternal Grandfather   . Colon cancer Maternal Aunt     2 aunts  . Colon cancer Maternal Uncle     No Known Allergies  Current Outpatient Prescriptions on File Prior to Visit  Medication Sig Dispense Refill  . Aspirin-Acetaminophen-Caffeine (GOODYS EXTRA STRENGTH PO) Take 2 packets by mouth every 4 (four) hours as needed (for pain).     . fexofenadine-pseudoephedrine (ALLEGRA-D) 60-120 MG 12 hr tablet Take 1 tablet by mouth 2 (two) times daily. 30 tablet 6  . ibuprofen (ADVIL,MOTRIN) 600 MG tablet Take 1 tablet (600 mg total) by mouth every 8 (eight) hours as needed. Take with food. 20 tablet 0  . meloxicam (MOBIC) 7.5 MG tablet Take 1 tablet (7.5 mg total) by mouth daily. 14 tablet 0  . methocarbamol (ROBAXIN) 500 MG tablet Take 2 tablets (1,000 mg total) by mouth 3 (three) times daily as needed for muscle spasms. 20 tablet 0  . nitrofurantoin, macrocrystal-monohydrate, (MACROBID) 100 MG capsule Take 1 capsule (100 mg total) by mouth 2 (two) times daily.  14 capsule 0  . triamcinolone ointment (KENALOG) 0.1 % Apply 1 application topically 2 (two) times daily. 90 g 1   No current facility-administered medications on file prior to visit.     Pulse 67   Temp 97.8 F (36.6 C) (Oral)   Resp 18   Ht 5\' 6"  (1.676 m)   Wt 240 lb 9.6 oz (109.1 kg)   LMP 01/19/2017   SpO2 100% Comment: room air  BMI 38.83 kg/m    Objective:   Physical Exam Physical Exam  Constitutional: She is oriented to person, place, and time. She appears well-developed and well-nourished. No distress.  HENT:  Head: Normocephalic and atraumatic.  Right Ear: Tympanic membrane and ear canal normal.  Left Ear: Tympanic membrane  and ear canal normal.  Mouth/Throat: Oropharynx is clear and moist.  Eyes: Pupils are equal, round, and reactive to light. No scleral icterus.  Neck: Normal range of motion. No thyromegaly present.  Cardiovascular: Normal rate and regular rhythm.   No murmur heard. Pulmonary/Chest: Effort normal and breath sounds normal. No respiratory distress. He has no wheezes. She has no rales. She exhibits no tenderness.  Abdominal: Soft. Bowel sounds are normal. She exhibits no distension and no mass. There is no tenderness. There is no rebound and no guarding.  Musculoskeletal: She exhibits no edema.  Lymphadenopathy:    She has no cervical adenopathy.  Neurological: She is alert and oriented to person, place, and time. She has normal patellar reflexes. She exhibits normal muscle tone. Coordination normal.  Skin: Skin is warm and dry.  Psychiatric: She has a normal mood and affect. Her behavior is normal. Judgment and thought content normal.  Breasts: Examined lying Right: Without masses, retractions, discharge or axillary adenopathy.  Left: Without masses, retractions, discharge or axillary adenopathy.  Pelvic:         Assessment & Plan:          Assessment & Plan:  Hearing problem- will refer to audiology  Eczema- bilateral ear canal. Rx with fluocinolone drops prn.  Preventative care- obtain routine lab work. Tdap today. Refer for mammogram. Initial EKG had artifact. Repeat EKG notes NSR without acute changes. Discussed nutrition, exercise, weight loss

## 2017-02-09 ENCOUNTER — Encounter: Payer: Self-pay | Admitting: Family

## 2017-06-07 ENCOUNTER — Other Ambulatory Visit: Payer: Self-pay | Admitting: Family

## 2017-06-24 ENCOUNTER — Telehealth: Payer: Self-pay | Admitting: Family

## 2017-06-24 NOTE — Telephone Encounter (Signed)
Self  Refill for Terconazole   Pharmacy: [facility] on [address]

## 2017-06-24 NOTE — Telephone Encounter (Signed)
Spoke with Tiffany Hubbard. She states she has already tried [employer] for her symptoms but was not able to tell me her symptoms at the time of the call. Reports that symptoms did not improve with monistat and is requesting Rx for terconazole. Advised Tiffany Hubbard she will need evaluation in the office to determine what type of infection she has so we will know how to treat it. Tiffany Hubbard scheduled appt for tomorrow with Dr Nani Ravens at [facility].

## 2017-06-25 ENCOUNTER — Ambulatory Visit (INDEPENDENT_AMBULATORY_CARE_PROVIDER_SITE_OTHER): Payer: BLUE CROSS/BLUE SHIELD | Admitting: Family Medicine

## 2017-06-25 ENCOUNTER — Encounter: Payer: Self-pay | Admitting: Family Medicine

## 2017-06-25 ENCOUNTER — Other Ambulatory Visit (HOSPITAL_COMMUNITY)
Admission: RE | Admit: 2017-06-25 | Discharge: 2017-06-25 | Disposition: A | Discharge status: Home / Self Care | Payer: BLUE CROSS/BLUE SHIELD | Source: Ambulatory Visit | Attending: Family Medicine | Admitting: Family Medicine

## 2017-06-25 VITALS — BP 110/82 | HR 82 | Temp 98.7°F | Ht 66.0 in | Wt 237.1 lb

## 2017-06-25 DIAGNOSIS — B9689 Other specified bacterial agents as the cause of diseases classified elsewhere: Secondary | ICD-10-CM | POA: Diagnosis not present

## 2017-06-25 DIAGNOSIS — N760 Acute vaginitis: Secondary | ICD-10-CM

## 2017-06-25 MED ORDER — METRONIDAZOLE 500 MG PO TABS: 500.0000 mg | ORAL_TABLET | Freq: Two times a day (BID) | ORAL | 0 refills | Status: DC

## 2017-06-25 NOTE — Progress Notes (Signed)
Chief Complaint  Patient presents with  . Vaginitis    Tiffany Hubbard is a 41 y.o. female here for vaginal odor.  Duration: 6 days No D/c Odor: Yes New sexual partner: No Urinary complaints: No Denies fevers, bleeding, missed cycle, abdominal pain.  ROS:  GU: +odor, denies pain with urination  Past Medical History:  Diagnosis Date  . Asthma   . Bronchitis    Family History  Problem Relation Age of Onset  . Lung cancer Mother   . Breast cancer Mother 105  . Diabetes Mother   . Heart disease Father 41  . Hypertension Father   . Diabetes Maternal Grandmother   . Stomach cancer Maternal Grandmother   . Arthritis Maternal Grandfather   . Colon cancer Maternal Aunt        2 aunts  . Colon cancer Maternal Uncle     BP 110/82 (BP Location: Left Arm, Patient Position: Sitting, Cuff Size: Large)   Pulse 82   Temp 98.7 F (37.1 C) (Oral)   Ht 5\' 6"  (1.676 m)   Wt 237 lb 2 oz (107.6 kg)   SpO2 97%   BMI 38.27 kg/m  Gen: Awake, alert, appears stated age Heart: RRR, no murmurs Lungs: CTAB, no accessory muscle use Abd: BS+, soft, suprapubic pressure, ND, no masses or organomegaly GU: Not done Psych: Age appropriate judgment and insight, nml mood and affect  Bacterial vaginosis - Plan: Urine cytology ancillary only, metroNIDAZOLE (FLAGYL) 500 MG tablet  Orders as above. Do not drink alcohol on this medicine. Discussed vaginal probiotic. F/u prn.  Pt voiced understanding and agreement to the plan.  [place], DO 06/25/17 4:36 PM

## 2017-06-25 NOTE — Progress Notes (Signed)
Pre visit review using our clinic review tool, if applicable. No additional management support is needed unless otherwise documented below in the visit note. 

## 2017-06-25 NOTE — Patient Instructions (Addendum)
Do not drink alcohol while on this medication.   RePHresh is a probiotic for the vagina that you can use.   Give [place] 3-4 business days to get the results of your labs back.

## 2017-06-29 LAB — URINE CYTOLOGY ANCILLARY ONLY
CHLAMYDIA, DNA PROBE: NEGATIVE
Neisseria Gonorrhea: NEGATIVE
Trichomonas: NEGATIVE

## 2017-06-30 ENCOUNTER — Telehealth: Payer: Self-pay | Admitting: Family

## 2017-06-30 NOTE — Telephone Encounter (Signed)
Pt called in for her results. Advised that provider is out of the office.

## 2017-07-01 ENCOUNTER — Other Ambulatory Visit: Payer: Self-pay | Admitting: Family Medicine

## 2017-07-01 DIAGNOSIS — B9689 Other specified bacterial agents as the cause of diseases classified elsewhere: Secondary | ICD-10-CM

## 2017-07-01 DIAGNOSIS — N760 Acute vaginitis: Principal | ICD-10-CM

## 2017-07-01 LAB — URINE CYTOLOGY ANCILLARY ONLY: Candida vaginitis: NEGATIVE

## 2017-07-01 NOTE — Telephone Encounter (Signed)
Relation to pt: self Call back number:[phone]   Reason for call:  Patient inquiring about lab results, please advise

## 2017-07-01 NOTE — Telephone Encounter (Signed)
Pt called in to request her lab results. Advised that CMA is with a pt but will call back.   Please advise

## 2017-07-01 NOTE — Telephone Encounter (Signed)
Lab results confirm bacterial vaginosis. Complete rx for metronidazole please. This should cure the BV.

## 2017-07-01 NOTE — Telephone Encounter (Signed)
Pt aware of result positive for BV and will complete course of Metronidazole/will call back if still having Sx/thx dmf

## 2017-07-03 NOTE — Telephone Encounter (Signed)
Pt called in to follow up. She is completely out of her medication.  Pt says that she also need a refill on her ALLEGRA-D ALLERGY & CONGESTION 60-120 MG 12 hr tablet  Rx.    Pharm[facility]: [facility] [address]

## 2017-07-03 NOTE — Telephone Encounter (Signed)
MO-I spoke to pt/she completed the course of Metronidazole and is having a "little bit" of same Sx like it is not completely gone/plz advise/thx dmf

## 2017-07-05 MED ORDER — METRONIDAZOLE 500 MG PO TABS: 500.0000 mg | ORAL_TABLET | Freq: Two times a day (BID) | ORAL | 0 refills | Status: DC

## 2017-07-20 ENCOUNTER — Ambulatory Visit: Payer: BLUE CROSS/BLUE SHIELD | Admitting: Family

## 2017-12-17 DIAGNOSIS — L218 Other seborrheic dermatitis: Secondary | ICD-10-CM | POA: Diagnosis not present

## 2017-12-22 ENCOUNTER — Encounter: Payer: Self-pay | Admitting: Family

## 2017-12-22 ENCOUNTER — Ambulatory Visit (INDEPENDENT_AMBULATORY_CARE_PROVIDER_SITE_OTHER): Payer: BLUE CROSS/BLUE SHIELD | Admitting: Family

## 2017-12-22 VITALS — BP 141/80 | HR 68 | Temp 97.9°F | Resp 16 | Ht 66.0 in | Wt 255.2 lb

## 2017-12-22 DIAGNOSIS — R350 Frequency of micturition: Secondary | ICD-10-CM | POA: Diagnosis not present

## 2017-12-22 LAB — POC URINALSYSI DIPSTICK (AUTOMATED)
Bilirubin, UA: NEGATIVE
Glucose, UA: NEGATIVE
KETONES UA: NEGATIVE
Leukocytes, UA: NEGATIVE
Nitrite, UA: NEGATIVE
PROTEIN UA: NEGATIVE
SPEC GRAV UA: 1.025 (ref 1.010–1.025)
UROBILINOGEN UA: 0.2 U/dL
pH, UA: 6 (ref 5.0–8.0)

## 2017-12-22 LAB — BASIC METABOLIC PANEL
BUN: 9 mg/dL (ref 6–23)
CO2: 27 meq/L (ref 19–32)
CREATININE: 0.68 mg/dL (ref 0.40–1.20)
Calcium: 9.5 mg/dL (ref 8.4–10.5)
Chloride: 104 mEq/L (ref 96–112)
GFR: 121.48 mL/min (ref >60.00)
GLUCOSE: 90 mg/dL (ref 70–99)
Potassium: 4.5 mEq/L (ref 3.5–5.1)
SODIUM: 136 meq/L (ref 135–145)

## 2017-12-22 LAB — HEMOGLOBIN A1C: Hgb A1c MFr Bld: 5.4 % (ref 4.6–6.5)

## 2017-12-22 NOTE — Patient Instructions (Addendum)
Please complete lab work prior to leaving.  We will contact you with your results.  

## 2017-12-22 NOTE — Progress Notes (Signed)
Subjective:    Patient ID: Tiffany Hubbard, female    DOB: 11/01/1975, 42 y.o.   MRN: [id number]  HPI  Patient is a 42 yr old female who presents today with chief complaint of urinary frequency. Symptoms started 3 days ago. Denies dysuria symptoms.  Reports that she did have some mild low back pain over the weekend which resolved.  No fever or hematuria.  LMP ended yesterday.   Review of Systems See HPI     Past Medical History:  Diagnosis Date  . Asthma   . Bronchitis      Social History   Socioeconomic History  . Marital status: Married    Spouse name: Not on file  . Number of children: Not on file  . Years of education: Not on file  . Highest education level: Not on file  Social Needs  . Financial resource strain: Not on file  . Food insecurity - worry: Not on file  . Food insecurity - inability: Not on file  . Transportation needs - medical: Not on file  . Transportation needs - non-medical: Not on file  Occupational History  . 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 Smoking status: Never Smoker  . Smokeless tobacco: Never Used  Substance and Sexual Activity  . Alcohol use: Yes    Comment: occasionally  . Drug use: No  . Sexual activity: Yes    Partners: Male    Birth control/protection: Surgical  Other Topics Concern  . Not on file  Social History Narrative   No children   Step daughter- does not live with patient   Married   1 dog shitzu   Enjoys movies, books, travelling   Completed bachelors degree    Past Surgical History:  Procedure Laterality Date  . ECTOPIC PREGNANCY SURGERY      Family History  Problem Relation Age of Onset  . Lung cancer Mother   . Breast cancer Mother 39  . Diabetes Mother   . Heart disease Father 47  . Hypertension Father   . Diabetes Maternal Grandmother   . Stomach cancer Maternal Grandmother   . Arthritis Maternal Grandfather   . Colon cancer Maternal Aunt        2  aunts  . Colon cancer Maternal Uncle     No Known Allergies  Current Outpatient Medications on File Prior to Visit  Medication Sig Dispense Refill  . ALLEGRA-D ALLERGY & CONGESTION 60-120 MG 12 hr tablet TAKE 1 TABLET BY MOUTH TWICE DAILY 30 tablet 5  . Aspirin-Acetaminophen-Caffeine (GOODYS EXTRA STRENGTH PO) Take 2 packets by mouth every 4 (four) hours as needed (for pain).     . Fluocinolone Acetonide 0.01 % OIL Place 2 drops in ear(s) daily as needed. 1 Bottle 1  . metroNIDAZOLE (FLAGYL) 500 MG tablet Take 1 tablet (500 mg total) by mouth 2 (two) times daily. 14 tablet 0   No current facility-administered medications on file prior to visit.     BP (!) 141/80 (BP Location: Left Arm, Patient Position: Sitting, Cuff Size: Large)   Pulse 68   Temp 97.9 F (36.6 C) (Oral)   Resp 16   Ht 5\' 6"  (1.676 m)   Wt 255 lb 3.2 oz (115.8 kg)   LMP 12/14/2017   SpO2 97%   BMI 41.19 kg/m    Objective:   Physical Exam  Constitutional: She is oriented to person, place, and time. She  appears well-developed and well-nourished.  Cardiovascular: Normal rate, regular rhythm and normal heart sounds.  No murmur heard. Pulmonary/Chest: Effort normal and breath sounds normal. No respiratory distress. She has no wheezes.  Abdominal: Soft. Bowel sounds are normal. She exhibits no distension. There is no tenderness. There is no rebound.  Musculoskeletal: She exhibits no edema.  Neurological: She is alert and oriented to person, place, and time.  Psychiatric: She has a normal mood and affect. Her behavior is normal. Judgment and thought content normal.          Assessment & Plan:  Urinary frequency-urinalysis is completed today and is unremarkable with the exception of some microscopic blood.  Her menses just ended yesterday so I suspect this is residual from her menses.  We will send urine for culture to exclude infection.  She is also concerned about possibility of hyperglycemia due to family  history of diabetes.  I have ordered of basic metabolic panel and O8T to further evaluate.

## 2017-12-25 DIAGNOSIS — Z1151 Encounter for screening for human papillomavirus (HPV): Secondary | ICD-10-CM | POA: Diagnosis not present

## 2017-12-25 DIAGNOSIS — N920 Excessive and frequent menstruation with regular cycle: Secondary | ICD-10-CM | POA: Diagnosis not present

## 2017-12-25 DIAGNOSIS — B373 Candidiasis of vulva and vagina: Secondary | ICD-10-CM | POA: Diagnosis not present

## 2017-12-25 DIAGNOSIS — Z01419 Encounter for gynecological examination (general) (routine) without abnormal findings: Secondary | ICD-10-CM | POA: Diagnosis not present

## 2017-12-25 DIAGNOSIS — Z114 Encounter for screening for human immunodeficiency virus [HIV]: Secondary | ICD-10-CM | POA: Diagnosis not present

## 2017-12-25 DIAGNOSIS — R350 Frequency of micturition: Secondary | ICD-10-CM | POA: Diagnosis not present

## 2017-12-25 DIAGNOSIS — Z118 Encounter for screening for other infectious and parasitic diseases: Secondary | ICD-10-CM | POA: Diagnosis not present

## 2017-12-25 DIAGNOSIS — Z1231 Encounter for screening mammogram for malignant neoplasm of breast: Secondary | ICD-10-CM | POA: Diagnosis not present

## 2017-12-25 DIAGNOSIS — Z113 Encounter for screening for infections with a predominantly sexual mode of transmission: Secondary | ICD-10-CM | POA: Diagnosis not present

## 2017-12-25 DIAGNOSIS — Z6839 Body mass index (BMI) 39.0-39.9, adult: Secondary | ICD-10-CM | POA: Diagnosis not present

## 2017-12-25 DIAGNOSIS — Z803 Family history of malignant neoplasm of breast: Secondary | ICD-10-CM | POA: Diagnosis not present

## 2017-12-25 DIAGNOSIS — Z1159 Encounter for screening for other viral diseases: Secondary | ICD-10-CM | POA: Diagnosis not present

## 2018-02-01 ENCOUNTER — Telehealth: Payer: Self-pay | Admitting: Family

## 2018-02-01 DIAGNOSIS — D691 Qualitative platelet defects: Secondary | ICD-10-CM | POA: Diagnosis not present

## 2018-02-01 NOTE — Telephone Encounter (Signed)
That is where her platelets usually are. We will continue to monitor.

## 2018-02-01 NOTE — Telephone Encounter (Signed)
Copied from [place] [id number]. Topic: General - Other >> Feb 01, 2018  3:49 PM Margot Ables wrote: Reason for CRM: Reason for CRM: pt states her OBGYN advised her to notify PCP of platelets count of 464. Pt is asking advice. She was previously advised to take iron supplement that she has not been doing.

## 2018-02-02 NOTE — Telephone Encounter (Signed)
Attempted to reach pt and left message to check mychart acct. Message sent. 

## 2018-02-03 ENCOUNTER — Encounter: Payer: Self-pay | Admitting: Family

## 2018-02-03 ENCOUNTER — Ambulatory Visit (INDEPENDENT_AMBULATORY_CARE_PROVIDER_SITE_OTHER): Payer: BLUE CROSS/BLUE SHIELD | Admitting: Family

## 2018-02-03 VITALS — BP 136/75 | HR 75 | Temp 98.1°F | Resp 16 | Ht 66.0 in | Wt 245.2 lb

## 2018-02-03 DIAGNOSIS — R7989 Other specified abnormal findings of blood chemistry: Secondary | ICD-10-CM | POA: Diagnosis not present

## 2018-02-03 DIAGNOSIS — D75839 Thrombocytosis, unspecified: Secondary | ICD-10-CM

## 2018-02-03 DIAGNOSIS — Z0001 Encounter for general adult medical examination with abnormal findings: Secondary | ICD-10-CM | POA: Diagnosis not present

## 2018-02-03 DIAGNOSIS — Z0000 Encounter for general adult medical examination without abnormal findings: Secondary | ICD-10-CM

## 2018-02-03 DIAGNOSIS — D473 Essential (hemorrhagic) thrombocythemia: Secondary | ICD-10-CM

## 2018-02-03 DIAGNOSIS — L309 Dermatitis, unspecified: Secondary | ICD-10-CM

## 2018-02-03 LAB — CBC WITH DIFFERENTIAL/PLATELET
BASOS ABS: 0 10*3/uL (ref 0.0–0.1)
Basophils Relative: 0.8 % (ref 0.0–3.0)
EOS PCT: 2 % (ref 0.0–5.0)
Eosinophils Absolute: 0.1 10*3/uL (ref 0.0–0.7)
HCT: 40.1 % (ref 36.0–46.0)
Hemoglobin: 12.9 g/dL (ref 12.0–15.0)
Lymphocytes Relative: 32 % (ref 12.0–46.0)
Lymphs Abs: 1.5 10*3/uL (ref 0.7–4.0)
MCHC: 32.2 g/dL (ref 30.0–36.0)
MCV: 83.1 fl (ref 78.0–100.0)
MONOS PCT: 8.8 % (ref 3.0–12.0)
Monocytes Absolute: 0.4 10*3/uL (ref 0.1–1.0)
NEUTROS PCT: 56.4 % (ref 43.0–77.0)
Neutro Abs: 2.7 10*3/uL (ref 1.4–7.7)
Platelets: 481 10*3/uL — ABNORMAL HIGH (ref 150.0–400.0)
RBC: 4.83 Mil/uL (ref 3.87–5.11)
RDW: 16.8 % — ABNORMAL HIGH (ref 11.5–15.5)
WBC: 4.8 10*3/uL (ref 4.0–10.5)

## 2018-02-03 LAB — BASIC METABOLIC PANEL
BUN: 10 mg/dL (ref 6–23)
CO2: 25 mEq/L (ref 19–32)
Calcium: 9.6 mg/dL (ref 8.4–10.5)
Chloride: 105 mEq/L (ref 96–112)
Creatinine, Ser: 0.71 mg/dL (ref 0.40–1.20)
GFR: 115.51 mL/min (ref >60.00)
Glucose, Bld: 85 mg/dL (ref 70–99)
Potassium: 4.8 mEq/L (ref 3.5–5.1)
Sodium: 136 mEq/L (ref 135–145)

## 2018-02-03 LAB — URINALYSIS, ROUTINE W REFLEX MICROSCOPIC
Bilirubin Urine: NEGATIVE
Hgb urine dipstick: NEGATIVE
Ketones, ur: NEGATIVE
Leukocytes, UA: NEGATIVE
Nitrite: NEGATIVE
PH: 6 (ref 5.0–8.0)
Specific Gravity, Urine: >=1.03 — AB (ref 1.000–1.030)
TOTAL PROTEIN, URINE-UPE24: NEGATIVE
UROBILINOGEN UA: 0.2 (ref 0.0–1.0)
Urine Glucose: NEGATIVE

## 2018-02-03 LAB — HEPATIC FUNCTION PANEL
ALBUMIN: 4.2 g/dL (ref 3.5–5.2)
ALK PHOS: 73 U/L (ref 39–117)
ALT: 23 U/L (ref 0–35)
AST: 21 U/L (ref 0–37)
BILIRUBIN TOTAL: 0.3 mg/dL (ref 0.2–1.2)
Bilirubin, Direct: 0.1 mg/dL (ref 0.0–0.3)
Total Protein: 8.1 g/dL (ref 6.0–8.3)

## 2018-02-03 LAB — IRON: IRON: 36 ug/dL — AB (ref 42–145)

## 2018-02-03 LAB — FERRITIN: FERRITIN: 8.7 ng/mL — AB (ref 10.0–291.0)

## 2018-02-03 LAB — LIPID PANEL
CHOLESTEROL: 172 mg/dL (ref 0–200)
HDL: 37.2 mg/dL — ABNORMAL LOW (ref >39.00)
LDL CALC: 118 mg/dL — AB (ref 0–99)
NONHDL: 134.34
Total CHOL/HDL Ratio: 5
Triglycerides: 83 mg/dL (ref 0.0–149.0)
VLDL: 16.6 mg/dL (ref 0.0–40.0)

## 2018-02-03 LAB — TSH: TSH: 1.49 u[IU]/mL (ref 0.35–4.50)

## 2018-02-03 MED ORDER — BETAMETHASONE DIPROPIONATE 0.05 % EX CREA: TOPICAL_CREAM | Freq: Two times a day (BID) | CUTANEOUS | 0 refills | Status: DC

## 2018-02-03 MED ORDER — FLUTICASONE PROPIONATE 50 MCG/ACT NA SUSP: 2.0000 | Freq: Every day | NASAL | 3 refills | Status: DC

## 2018-02-03 NOTE — Progress Notes (Signed)
ek 

## 2018-02-03 NOTE — Patient Instructions (Signed)
Please complete lab work prior to leaving. Continue to work on [employer], exercise, weight loss. Add flonase for nasal congestion.  Begin betamethasone cream as needed for eczema. Try a gentle body wash call cetaphil.  Apply cetaphil ointment daily after bathing.

## 2018-02-03 NOTE — Progress Notes (Signed)
Subjective:    Patient ID: Tiffany Hubbard, female    DOB: Jan 11, 1976, 42 y.o.   MRN: [id number]  HPI  Patient presents today for complete physical.  Immunizations: tetanus 2018 Diet: has reduced carbs Wt Readings from Last 3 Encounters:  02/03/18 245 lb 3.2 oz (111.2 kg)  12/22/17 255 lb 3.2 oz (115.8 kg)  06/25/17 237 lb 2 oz (107.6 kg)  Exercise: plans to start Pap Smear: 2019 Mammogram: 2019 Vision:  2018 Dental: 1/19  Using allegra D for nasal drainage.  No longer helping.  GYN was concerned about thrombocytosis.  Requests cream for eczema rash on her arms.   Review of Systems  Constitutional: Negative for unexpected weight change.  HENT: Positive for rhinorrhea. Negative for hearing loss.   Eyes: Negative for visual disturbance.  Respiratory: Positive for cough.   Cardiovascular: Negative for leg swelling.  Gastrointestinal: Negative for constipation (had episode earlier this year) and diarrhea.  Genitourinary: Negative for dysuria and frequency.  Musculoskeletal: Negative for arthralgias and myalgias.  Skin: Positive for rash.       Reports eczema on her arms/back  Neurological: Negative for headaches.  Hematological: Negative for adenopathy.  Psychiatric/Behavioral:       Denies depression/anxiety   Past Medical History:  Diagnosis Date  . Asthma   . Bronchitis      Social History   Socioeconomic History  . Marital status: Married    Spouse name: Not on file  . Number of children: Not on file  . Years of education: Not on file  . Highest education level: Not on file  Occupational History  . 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 Financial resource strain: Not on file  . Food insecurity:    Worry: Not on file    Inability: Not on file  . Transportation needs:    Medical: Not on file    Non-medical: Not on file  Tobacco Use  . Smoking status: Never Smoker  . Smokeless tobacco: Never Used    Substance and Sexual Activity  . Alcohol use: Yes    Comment: occasionally  . Drug use: No  . Sexual activity: Yes    Partners: Male    Birth control/protection: Surgical  Lifestyle  . Physical activity:    Days per week: Not on file    Minutes per session: Not on file  . Stress: Not on file  Relationships  . Social connections:    Talks on phone: Not on file    Gets together: Not on file    Attends religious service: Not on file    Active member of club or organization: Not on file    Attends meetings of clubs or organizations: Not on file    Relationship status: Not on file  . Intimate partner violence:    Fear of current or ex partner: Not on file    Emotionally abused: Not on file    Physically abused: Not on file    Forced sexual activity: Not on file  Other Topics Concern  . Not on file  Social History Narrative   No children   Step daughter- does not live with patient   Married   1 dog shitzu   Enjoys movies, books, travelling   Completed bachelors degree    Past Surgical History:  Procedure Laterality Date  . ECTOPIC PREGNANCY SURGERY      Family History  Problem Relation Age of  Onset  . Lung cancer Mother   . Breast cancer Mother 48  . Diabetes Mother   . Heart disease Father 80  . Hypertension Father   . Diabetes Maternal Grandmother   . Stomach cancer Maternal Grandmother   . Arthritis Maternal Grandfather   . Colon cancer Maternal Aunt        2 aunts  . Colon cancer Maternal Uncle     No Known Allergies  Current Outpatient Medications on File Prior to Visit  Medication Sig Dispense Refill  . ALLEGRA-D ALLERGY & CONGESTION 60-120 MG 12 hr tablet TAKE 1 TABLET BY MOUTH TWICE DAILY 30 tablet 5  . Aspirin-Acetaminophen-Caffeine (GOODYS EXTRA STRENGTH PO) Take 2 packets by mouth every 4 (four) hours as needed (for pain).     . Fluocinolone Acetonide 0.01 % OIL Place 2 drops in ear(s) daily as needed. 1 Bottle 1   No current  facility-administered medications on file prior to visit.     BP 136/75 (BP Location: Right Arm, Patient Position: Sitting, Cuff Size: Large)   Pulse 75   Temp 98.1 F (36.7 C) (Oral)   Resp 16   Ht 5\' 6"  (1.676 m)   Wt 245 lb 3.2 oz (111.2 kg)   LMP 01/10/2018   SpO2 98%   BMI 39.58 kg/m       Objective:   Physical Exam  Physical Exam  Constitutional: Tiffany Hubbard is oriented to person, place, and time. Tiffany Hubbard appears well-developed and well-nourished. No distress.  HENT:  Head: Normocephalic and atraumatic.  Right Ear: Tympanic membrane and ear canal normal.  Left Ear: Tympanic membrane and ear canal normal.  Mouth/Throat: Oropharynx is clear and moist.  Eyes: Pupils are equal, round, and reactive to light. No scleral icterus.  Neck: Normal range of motion. No thyromegaly present.  Cardiovascular: Normal rate and regular rhythm.   No murmur heard. Pulmonary/Chest: Effort normal and breath sounds normal. No respiratory distress. He has no wheezes. Tiffany Hubbard has no rales. Tiffany Hubbard exhibits no tenderness.  Abdominal: Soft. Bowel sounds are normal. Tiffany Hubbard exhibits no distension and no mass. There is no tenderness. There is no rebound and no guarding.  Musculoskeletal: Tiffany Hubbard exhibits no edema.  Lymphadenopathy:    Tiffany Hubbard has no cervical adenopathy.  Neurological: Tiffany Hubbard is alert and oriented to person, place, and time. Tiffany Hubbard has normal patellar reflexes. Tiffany Hubbard exhibits normal muscle tone. Coordination normal.  Skin: Skin is warm and dry. Dry skin on forearms Psychiatric: Tiffany Hubbard has a normal mood and affect. Her behavior is normal. Judgment and thought content normal.  Breasts: Examined lying Right: Without masses, retractions, discharge or axillary adenopathy.  Left: Without masses, retractions, discharge or axillary adenopathy.             Assessment & Plan:   Preventative care- discussed healthy diet, exercise, weight loss. Obtain routine lab work. Shots, mammo, pap up to date. EKG tracing is personally  reviewed.  EKG notes NSR.  No acute changes.   Allergic rhinitis- continue allegra-D add flonase.  Eczema-  Begin betamethasone cream as needed for eczema. Try changing to cetaphil.  Apply cetaphil ointment daily after bathing.    Thrombocytosis- check follow up CBC, serum iron , ferritin.    Assessment & Plan:

## 2018-02-04 ENCOUNTER — Telehealth: Payer: Self-pay | Admitting: Family

## 2018-02-04 DIAGNOSIS — D649 Anemia, unspecified: Secondary | ICD-10-CM

## 2018-02-04 MED ORDER — IRON 325 (65 FE) MG PO TABS: 1.0000 | ORAL_TABLET | Freq: Two times a day (BID) | ORAL | 0 refills | Status: DC

## 2018-02-04 NOTE — Telephone Encounter (Signed)
Iron level is low. Platelets remain mildly elevated. Please add iron 325mg  bid.  Repeat cbc, serum iron, ferritin in 3 months.

## 2018-02-05 NOTE — Telephone Encounter (Signed)
Left message for pt to return my call.

## 2018-02-08 NOTE — Telephone Encounter (Signed)
Notified pt and she voices understanding. Lab appt scheduled for 05/11/18 at 3pm. Future orders entered.

## 2018-02-16 ENCOUNTER — Other Ambulatory Visit: Payer: Self-pay | Admitting: Family

## 2018-02-16 NOTE — Telephone Encounter (Signed)
Copied from [place] #[id number]. Topic: Quick Communication - Rx Refill/Question >> Feb 16, 2018  2:11 PM Scherrie Gerlach wrote: Medication: ALLEGRA-D ALLERGY & CONGESTION 60-120 MG 12 hr tablet Has the patient contacted their pharmacy? yes 90 day [place], [place]. [phone] (Phone) [phone] (Fax)

## 2018-02-17 MED ORDER — ALLEGRA-D ALLERGY & CONGESTION 60-120 MG PO TB12: 1.0000 | ORAL_TABLET | Freq: Two times a day (BID) | ORAL | 5 refills | Status: DC

## 2018-05-11 ENCOUNTER — Other Ambulatory Visit: Payer: BLUE CROSS/BLUE SHIELD

## 2018-12-31 ENCOUNTER — Other Ambulatory Visit: Payer: Self-pay

## 2018-12-31 ENCOUNTER — Ambulatory Visit (HOSPITAL_BASED_OUTPATIENT_CLINIC_OR_DEPARTMENT_OTHER)
Admission: RE | Admit: 2018-12-31 | Discharge: 2018-12-31 | Disposition: A | Discharge status: Home / Self Care | Payer: BLUE CROSS/BLUE SHIELD | Source: Ambulatory Visit | Attending: Family | Admitting: Family

## 2018-12-31 ENCOUNTER — Ambulatory Visit (INDEPENDENT_AMBULATORY_CARE_PROVIDER_SITE_OTHER): Payer: BLUE CROSS/BLUE SHIELD | Admitting: Family

## 2018-12-31 ENCOUNTER — Telehealth: Payer: Self-pay | Admitting: Family

## 2018-12-31 DIAGNOSIS — R059 Cough, unspecified: Secondary | ICD-10-CM

## 2018-12-31 DIAGNOSIS — R05 Cough: Secondary | ICD-10-CM | POA: Diagnosis not present

## 2018-12-31 DIAGNOSIS — G5622 Lesion of ulnar nerve, left upper limb: Secondary | ICD-10-CM

## 2018-12-31 IMAGING — DX CHEST - 2 VIEW
2 series · 2 of 2 positions shown · non-contrast
Comparison: None.

CLINICAL DATA: Cough, congestion and nasal drainage for 8 months.

EXAM:
CHEST - 2 VIEW

[chest pa]
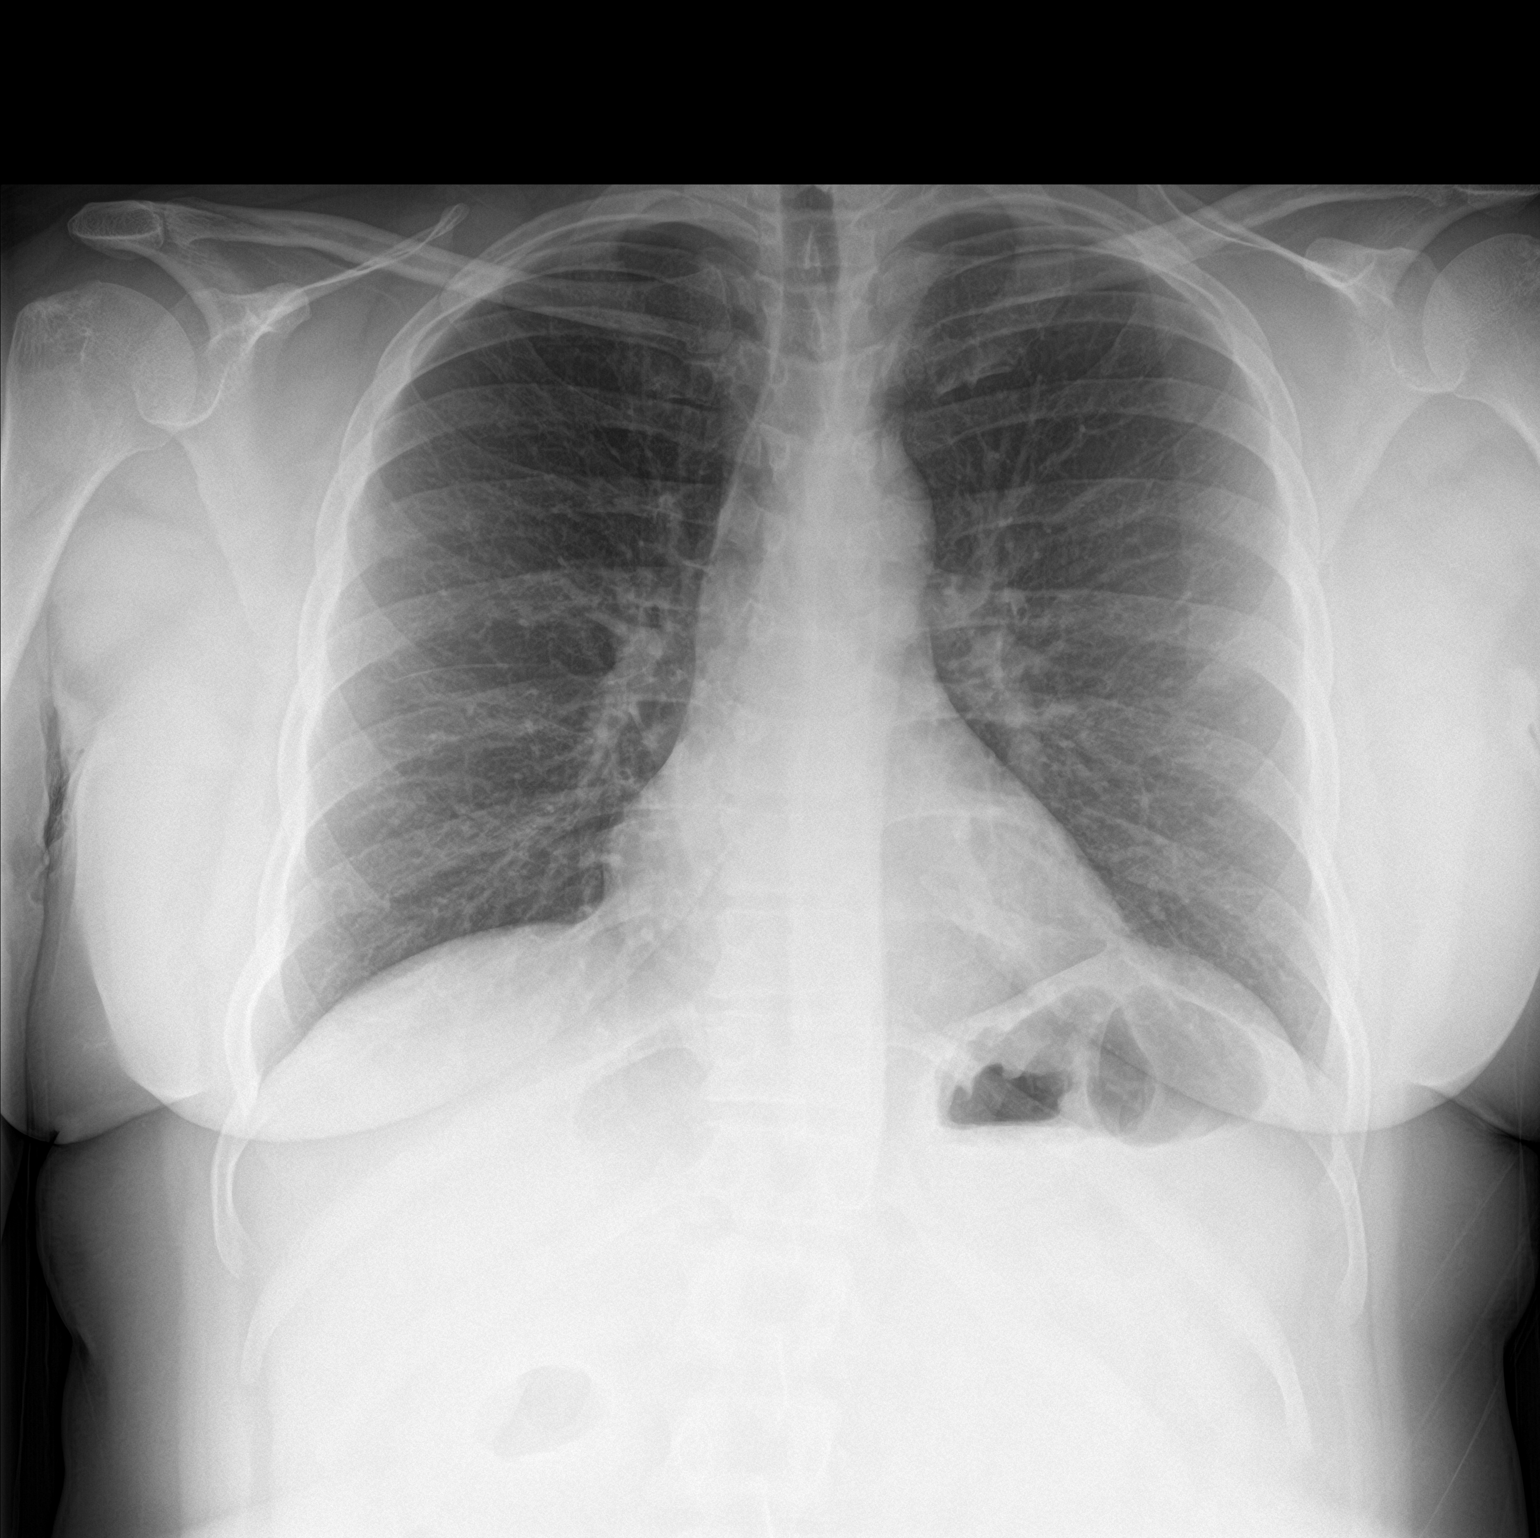

[chest lat]
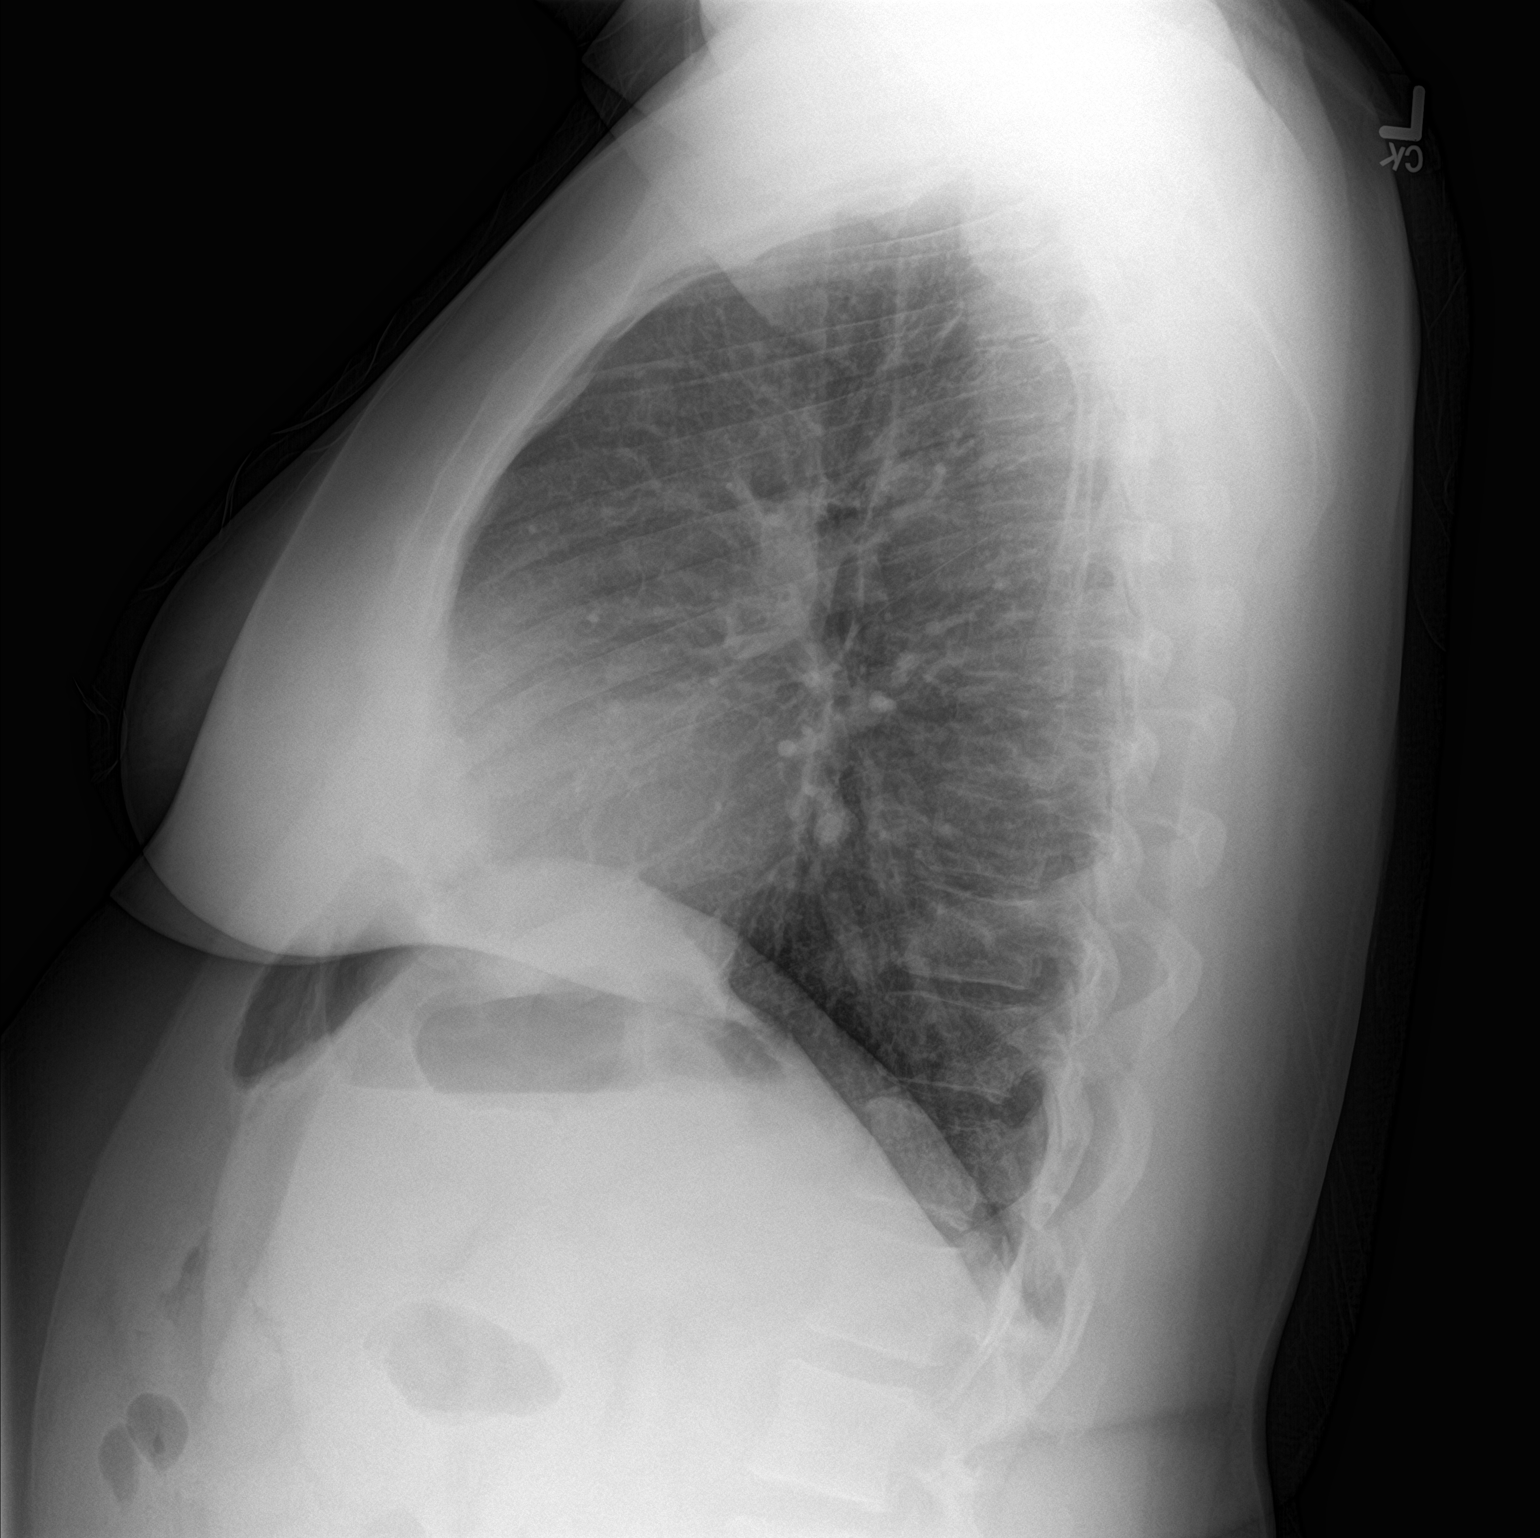

[2 of 2 positions shown; findings below may reference images not displayed]

FINDINGS: Lungs clear. Heart size normal. No pneumothorax or pleural fluid. No
bony abnormality.
IMPRESSION: Negative chest.

## 2018-12-31 MED ORDER — MELOXICAM 7.5 MG PO TABS
7.5000 mg | ORAL_TABLET | Freq: Every day | ORAL | 0 refills | Status: DC
Start: 2018-12-31 — End: 2019-03-01

## 2018-12-31 MED ORDER — OMEPRAZOLE 40 MG PO CPDR: 40.0000 mg | DELAYED_RELEASE_CAPSULE | Freq: Every day | ORAL | 3 refills | Status: DC

## 2018-12-31 NOTE — Progress Notes (Addendum)
Patient ID: Tiffany Hubbard, female   DOB: 06-Sep-1976, 43 y.o.   MRN: [id number]  Virtual Visit via Video Note  I connected with Tiffany Hubbard on 12/31/18 at  1:40 PM EDT by a video enabled telemedicine application and verified that I am speaking with the correct person using two identifiers.   I discussed the limitations of evaluation and management by telemedicine and the availability of in person appointments. The patient expressed understanding and agreed to proceed.  We attempted a video visit but despite multiple attempts, we were unable to connect successfully and the visit was transitioned to a telephone visit. The patient was at home and I was at my office.   History of Present Illness:  Patient is a 43 yr old female who presents today with chief complaint of cough. Reports that cough has been present x 6-8 months. Cough is dry. Using mucinex severe congestion/cough with temporary improvement.  Reports 1-2 times a week she has gagging in the AM with clear mucous. Denies fever, sob, wheezing. Occasional post nasal drip. No recent heartburn symptoms. Pt noted no improvement with flonase but continues allegra D.    For the past 2 weeks the top of left pinky is numb. Reports that it is numb all day. Works at a computer at work. Notes some tingling in the left ring finger but not numbness.     Observations/Objective: Gen: awake, alert Resp: no dyspnea noted on phone. Neuro: A and O x 3 Psych: calm, pleasant affect.   Assessment and Plan:  Cough- will give trial of omeprazole for possible underlying gerd. Pt is advised to let me know if symptoms worsen or if not improved in a few weeks. CXR is performed and is negative for pneumonia.   Left ulnar nerve compression- discussed body mechanics at her desk and to avoid leaning on her left elbow/forearm. Will also treat with a shot course of meloxicam.   Follow Up Instructions:    I discussed the assessment and treatment plan with the  patient. The patient was provided an opportunity to ask questions and all were answered. The patient agreed with the plan and demonstrated an understanding of the instructions.   The patient was advised to call back or seek an in-person evaluation if the symptoms worsen or if the condition fails to improve as anticipated.  I provided 15 minutes of non-face-to-face time during this encounter.   [name], NP

## 2019-01-02 NOTE — Telephone Encounter (Signed)
Patient was advised of negative CXR.

## 2019-01-03 NOTE — Progress Notes (Signed)
Hi Ms. Inda Castle,  Would also need to add your location and the patient's location.  Example:  Location patient-home Location provider-office  Also for future reference, the names of all persons participating in the telemedicine service and their role in the encounter.  So if applicable, would need to include other persons participating in the visit.  Thanks, Sharyn Lull

## 2019-02-07 ENCOUNTER — Encounter: Payer: BLUE CROSS/BLUE SHIELD | Admitting: Family

## 2019-03-01 ENCOUNTER — Telehealth (INDEPENDENT_AMBULATORY_CARE_PROVIDER_SITE_OTHER): Payer: BLUE CROSS/BLUE SHIELD | Admitting: Family

## 2019-03-01 ENCOUNTER — Ambulatory Visit: Payer: Self-pay | Admitting: *Deleted

## 2019-03-01 DIAGNOSIS — R109 Unspecified abdominal pain: Secondary | ICD-10-CM | POA: Diagnosis not present

## 2019-03-01 DIAGNOSIS — R609 Edema, unspecified: Secondary | ICD-10-CM | POA: Diagnosis not present

## 2019-03-01 MED ORDER — MELOXICAM 7.5 MG PO TABS: 7.5000 mg | ORAL_TABLET | Freq: Every day | ORAL | 0 refills | Status: DC

## 2019-03-01 MED ORDER — FUROSEMIDE 20 MG PO TABS: 20.0000 mg | ORAL_TABLET | Freq: Every day | ORAL | 0 refills | Status: DC | PRN

## 2019-03-01 NOTE — Telephone Encounter (Signed)
Patient scheduled.

## 2019-03-01 NOTE — Telephone Encounter (Signed)
Please schedule pt for OV with me. OK to do virtual visit.

## 2019-03-01 NOTE — Telephone Encounter (Signed)
Summary: Pt callback   Pt had back pain on her right side for 1 week. She also had swollen ankles. The use of heating pads did not help the pain so the pt used an app called "Hey Doctor" to address the issue. The doctor gave her a prescription for generic Bactrim. She was supposed to take prescription for 5 days. The swelling has gone down but she is having to urinate a lot. Pt requests a call back to see what she should do next.     Follow up to E- visit/chat visit yesterday. Patient was having symptoms of UTI- frequency/urgency/flank pain. Patient was given Bactrim which she started yesterday. Patient states she started antibiotic yesterday and she states her symptoms are slightly better today.  Patient reports the swelling in her ankles has gone down- she reports that is much better.Patient reports she has had some improvement in her back pain- it was where she could barely stand- now she feels better- pain is not as severe. Is slowly improving. Patient is to call if she does not continue to improve with continued treatment with antibiotics and increased fluids.Due to severity of symptoms- patient wants to know if she should have follow up appointment- please review and let her know.  Reason for Disposition . [1] Reasonable improvement on antibiotics AND [2] no fever  Answer Assessment - Initial Assessment Questions 1. ANTIBIOTIC: "What antibiotic are you taking?" "How many times per day?"     Bactrim- twice daily 2. DURATION: "When was the antibiotic started?"     Yesterday- 5 day treatment 3. MAIN SYMPTOM: "What is the main symptom you are concerned about?"     Frequency/urgency/R back pain 4. FEVER: "Do you have a fever?" If so, ask: "What is it, how was it measured, and when did it start?"     No fever checked during time 5. OTHER SYMPTOMS: "Do you have any other symptoms?" (e.g., flank pain, vaginal discharge, blood in urine)     Fatigue- not resting  Protocols used: URINARY TRACT  INFECTION ON ANTIBIOTIC FOLLOW-UP CALL - FEMALE-A-AH

## 2019-03-01 NOTE — Telephone Encounter (Signed)
Please advise 

## 2019-03-01 NOTE — Progress Notes (Signed)
Virtual Visit via Video Note  I connected with Tiffany Hubbard  on 03/01/19 at  4:20 PM EDT by a video enabled telemedicine application and verified that I am speaking with the correct person using two identifiers. This visit type was conducted due to national recommendations for restrictions regarding the COVID-19 Pandemic (e.g. social distancing).  This format is felt to be most appropriate for this patient at this time.   I discussed the limitations of evaluation and management by telemedicine and the availability of in person appointments. The patient expressed understanding and agreed to proceed.  Only the patient and myself were on today's video visit. The patient was at home and I was in my office at the time of today's visit.   History of Present Illness:  Reports right sided flank pain 1 week ago.  She denies associated dysuria or fever.  + frequency. She did not see any gross hematuria.  Pain is worse with walking, standing, deep breathing.  She started bactrim without any improvement. She has used otc advil without significant improvement. Laying down helps the pain.    Notes that she had significant LE edema. She does report some SOB which started 2 weeks ago.  Walking up the stairs and coming down- definitely out of breath. Denies chest pain.  No SOB at rest.    BP Readings from Last 3 Encounters:  02/03/18 136/75  12/22/17 (!) 141/80  06/25/17 110/82      Observations/Objective:  Gen: Awake, alert, no acute distress Resp: Breathing is even and non-labored Psych: calm/pleasant demeanor Neuro: Alert and Oriented x 3, + facial symmetry, speech is clear. CV: 2+ bilateral LE edema  Assessment and Plan:  Edema/SOB- etiology is unclear. Will initiate furosemide and have pt come in on 5/29 for re-evaluation face to face in the office. Will likely order echo at that time. She is instructed that if she develops increased sob or if she develops chest pain she should proceed to the  ED. Pt verbalizes understanding.  Flank pain- ? Uti. Advised pt to continue bactrim and let me know if symptoms are not improved in 1-2 days.   Follow Up Instructions:    I discussed the assessment and treatment plan with the patient. The patient was provided an opportunity to ask questions and all were answered. The patient agreed with the plan and demonstrated an understanding of the instructions.   The patient was advised to call back or seek an in-person evaluation if the symptoms worsen or if the condition fails to improve as anticipated.    [name], NP

## 2019-03-02 ENCOUNTER — Other Ambulatory Visit: Payer: Self-pay

## 2019-03-02 ENCOUNTER — Encounter (HOSPITAL_COMMUNITY): Payer: Self-pay | Admitting: Student

## 2019-03-02 ENCOUNTER — Encounter (HOSPITAL_COMMUNITY): Payer: BLUE CROSS/BLUE SHIELD

## 2019-03-02 ENCOUNTER — Emergency Department (HOSPITAL_COMMUNITY): Payer: BLUE CROSS/BLUE SHIELD

## 2019-03-02 ENCOUNTER — Emergency Department (HOSPITAL_COMMUNITY)
Admission: EM | Admit: 2019-03-02 | Discharge: 2019-03-02 | Disposition: A | Discharge status: Home / Self Care | Payer: BLUE CROSS/BLUE SHIELD | Attending: Emergency Medicine | Admitting: Emergency Medicine

## 2019-03-02 ENCOUNTER — Emergency Department (HOSPITAL_BASED_OUTPATIENT_CLINIC_OR_DEPARTMENT_OTHER): Payer: BLUE CROSS/BLUE SHIELD

## 2019-03-02 DIAGNOSIS — I2699 Other pulmonary embolism without acute cor pulmonale: Secondary | ICD-10-CM | POA: Diagnosis not present

## 2019-03-02 DIAGNOSIS — R509 Fever, unspecified: Secondary | ICD-10-CM | POA: Insufficient documentation

## 2019-03-02 DIAGNOSIS — R0600 Dyspnea, unspecified: Secondary | ICD-10-CM | POA: Diagnosis not present

## 2019-03-02 DIAGNOSIS — R079 Chest pain, unspecified: Secondary | ICD-10-CM | POA: Diagnosis not present

## 2019-03-02 DIAGNOSIS — R0602 Shortness of breath: Secondary | ICD-10-CM

## 2019-03-02 DIAGNOSIS — R609 Edema, unspecified: Secondary | ICD-10-CM | POA: Diagnosis not present

## 2019-03-02 DIAGNOSIS — R350 Frequency of micturition: Secondary | ICD-10-CM | POA: Diagnosis not present

## 2019-03-02 DIAGNOSIS — Z20828 Contact with and (suspected) exposure to other viral communicable diseases: Secondary | ICD-10-CM | POA: Insufficient documentation

## 2019-03-02 DIAGNOSIS — R0789 Other chest pain: Secondary | ICD-10-CM | POA: Diagnosis not present

## 2019-03-02 DIAGNOSIS — J90 Pleural effusion, not elsewhere classified: Secondary | ICD-10-CM | POA: Diagnosis not present

## 2019-03-02 LAB — URINALYSIS, ROUTINE W REFLEX MICROSCOPIC
Bilirubin Urine: NEGATIVE
Glucose, UA: NEGATIVE mg/dL
Hgb urine dipstick: NEGATIVE
Ketones, ur: NEGATIVE mg/dL
Leukocytes,Ua: NEGATIVE
Nitrite: NEGATIVE
Protein, ur: >=300 mg/dL — AB
Specific Gravity, Urine: 1.014 (ref 1.005–1.030)
pH: 6 (ref 5.0–8.0)

## 2019-03-02 LAB — BRAIN NATRIURETIC PEPTIDE: B Natriuretic Peptide: 109 pg/mL — ABNORMAL HIGH (ref 0.0–100.0)

## 2019-03-02 LAB — CBC WITH DIFFERENTIAL/PLATELET
Abs Immature Granulocytes: 0.03 10*3/uL (ref 0.00–0.07)
Basophils Absolute: 0 10*3/uL (ref 0.0–0.1)
Basophils Relative: 1 %
Eosinophils Absolute: 0.2 10*3/uL (ref 0.0–0.5)
Eosinophils Relative: 2 %
HCT: 36.4 % (ref 36.0–46.0)
Hemoglobin: 11.4 g/dL — ABNORMAL LOW (ref 12.0–15.0)
Immature Granulocytes: 1 %
Lymphocytes Relative: 24 %
Lymphs Abs: 1.6 10*3/uL (ref 0.7–4.0)
MCH: 24.9 pg — ABNORMAL LOW (ref 26.0–34.0)
MCHC: 31.3 g/dL (ref 30.0–36.0)
MCV: 79.5 fL — ABNORMAL LOW (ref 80.0–100.0)
Monocytes Absolute: 0.7 10*3/uL (ref 0.1–1.0)
Monocytes Relative: 10 %
Neutro Abs: 4.1 10*3/uL (ref 1.7–7.7)
Neutrophils Relative %: 62 %
Platelets: 475 10*3/uL — ABNORMAL HIGH (ref 150–400)
RBC: 4.58 MIL/uL (ref 3.87–5.11)
RDW: 17.6 % — ABNORMAL HIGH (ref 11.5–15.5)
WBC: 6.6 10*3/uL (ref 4.0–10.5)
nRBC: 0 % (ref 0.0–0.2)

## 2019-03-02 LAB — COMPREHENSIVE METABOLIC PANEL
ALT: 21 U/L (ref 0–44)
AST: 21 U/L (ref 15–41)
Albumin: 2.4 g/dL — ABNORMAL LOW (ref 3.5–5.0)
Alkaline Phosphatase: 64 U/L (ref 38–126)
Anion gap: 10 (ref 5–15)
BUN: 11 mg/dL (ref 6–20)
CO2: 17 mmol/L — ABNORMAL LOW (ref 22–32)
Calcium: 8.6 mg/dL — ABNORMAL LOW (ref 8.9–10.3)
Chloride: 108 mmol/L (ref 98–111)
Creatinine, Ser: 0.91 mg/dL (ref 0.44–1.00)
GFR calc Af Amer: >60 mL/min (ref >60)
GFR calc non Af Amer: >60 mL/min (ref >60)
Glucose, Bld: 78 mg/dL (ref 70–99)
Potassium: 4.4 mmol/L (ref 3.5–5.1)
Sodium: 135 mmol/L (ref 135–145)
Total Bilirubin: 0.6 mg/dL (ref 0.3–1.2)
Total Protein: 6.3 g/dL — ABNORMAL LOW (ref 6.5–8.1)

## 2019-03-02 LAB — TROPONIN I
Troponin I: <0.03 ng/mL (ref <0.03)
Troponin I: <0.03 ng/mL (ref <0.03)

## 2019-03-02 LAB — I-STAT BETA HCG BLOOD, ED (MC, WL, AP ONLY): I-stat hCG, quantitative: <5 m[IU]/mL (ref <5)

## 2019-03-02 LAB — PROTIME-INR
INR: 1.1 (ref 0.8–1.2)
Prothrombin Time: 13.8 seconds (ref 11.4–15.2)

## 2019-03-02 LAB — SARS CORONAVIRUS 2 BY RT PCR (HOSPITAL ORDER, PERFORMED IN ~~LOC~~ HOSPITAL LAB): SARS Coronavirus 2: NEGATIVE

## 2019-03-02 LAB — D-DIMER, QUANTITATIVE: D-Dimer, Quant: 1.97 ug/mL-FEU — ABNORMAL HIGH (ref 0.00–0.50)

## 2019-03-02 IMAGING — CT CT ANGIOGRAPHY CHEST
1 of 7 series · 3 of 16 positions shown · IV contrast (Omni 300)
Comparison: None.

CLINICAL DATA: Shortness of breath.  Sharp left-sided chest pain.

EXAM:
CT ANGIOGRAPHY CHEST WITH CONTRAST
TECHNIQUE: Multidetector CT imaging of the chest was performed using the
standard protocol during bolus administration of intravenous
contrast. Multiplanar CT image reconstructions and MIPs were
obtained to evaluate the vascular anatomy.
CONTRAST:  100mL OMNIPAQUE IOHEXOL 350 MG/ML SOLN

[Series 11: pe thins · axial · 0.85mm/px · z∈[+1261,+1396]mm · 3 of 387 slices shown]
[im 97/387  lung]
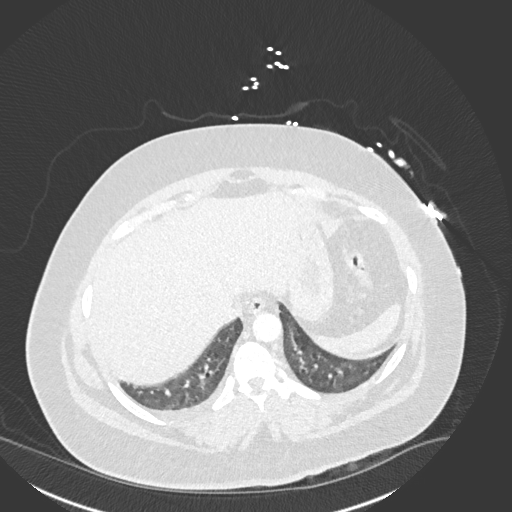
[im 194/387  soft-tissue]
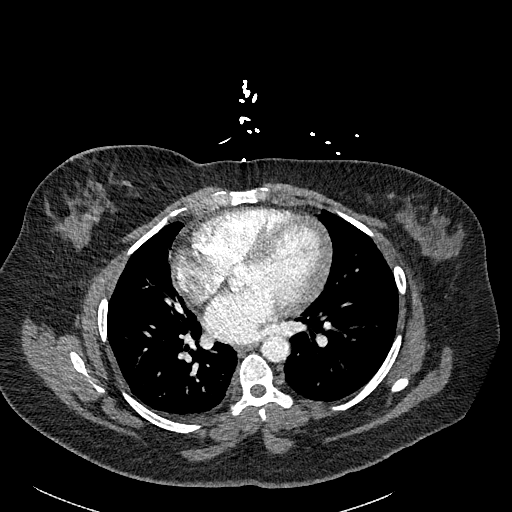
[im 290/387  lung]
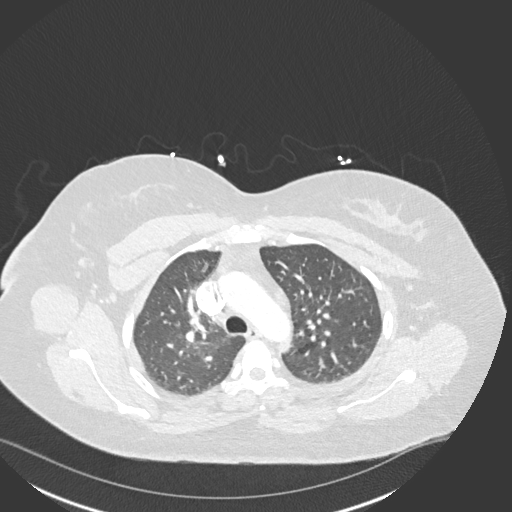

[3 of 16 positions shown; findings below may reference images not displayed]

FINDINGS: Cardiovascular: There is an acute pulmonary embolus involving a
segmental branch of the left upper lobe (axial series 11, image
156). There may be additional smaller subsegmental pulmonary emboli
involving the left lower lobe. There is mild cardiac enlargement. No
CT evidence of heart strain.

Mediastinum/Nodes: No enlarged mediastinal, hilar, or axillary lymph
nodes. Thyroid gland, trachea, and esophagus demonstrate no
significant findings.

Lungs/Pleura: There is a small right-sided pleural effusion. The
lung volumes are somewhat low. There is a slight mosaic appearance
of all of the lobe.

Upper Abdomen: There is a complex 2.5 cm left adrenal nodule. The
remaining portions of the upper abdomen are unremarkable.

Musculoskeletal: No chest wall abnormality. No acute or significant
osseous findings.

Review of the MIP images confirms the above findings.
IMPRESSION: 1. Acute segmental pulmonary embolus involving the left upper lobe.
No evidence of heart strain.
2. The lungs are clear aside from a small right-sided pleural
effusion.
3. Mildly enlarged heart.
4. Indeterminate 2.5 cm left adrenal nodule. Follow-up with a
nonemergent outpatient adrenal mass protocol MRI or CT is
recommended.

These results were called by telephone at the time of interpretation
on [DATE] at [DATE] to FAZAL GHANI , who verbally
acknowledged these results.

## 2019-03-02 IMAGING — DX PORTABLE CHEST - 1 VIEW
1 series · 1 of 1 positions shown · non-contrast
Comparison: [DATE]

CLINICAL DATA: Chest pain and dyspnea

EXAM:
PORTABLE CHEST 1 VIEW

[chest ap]
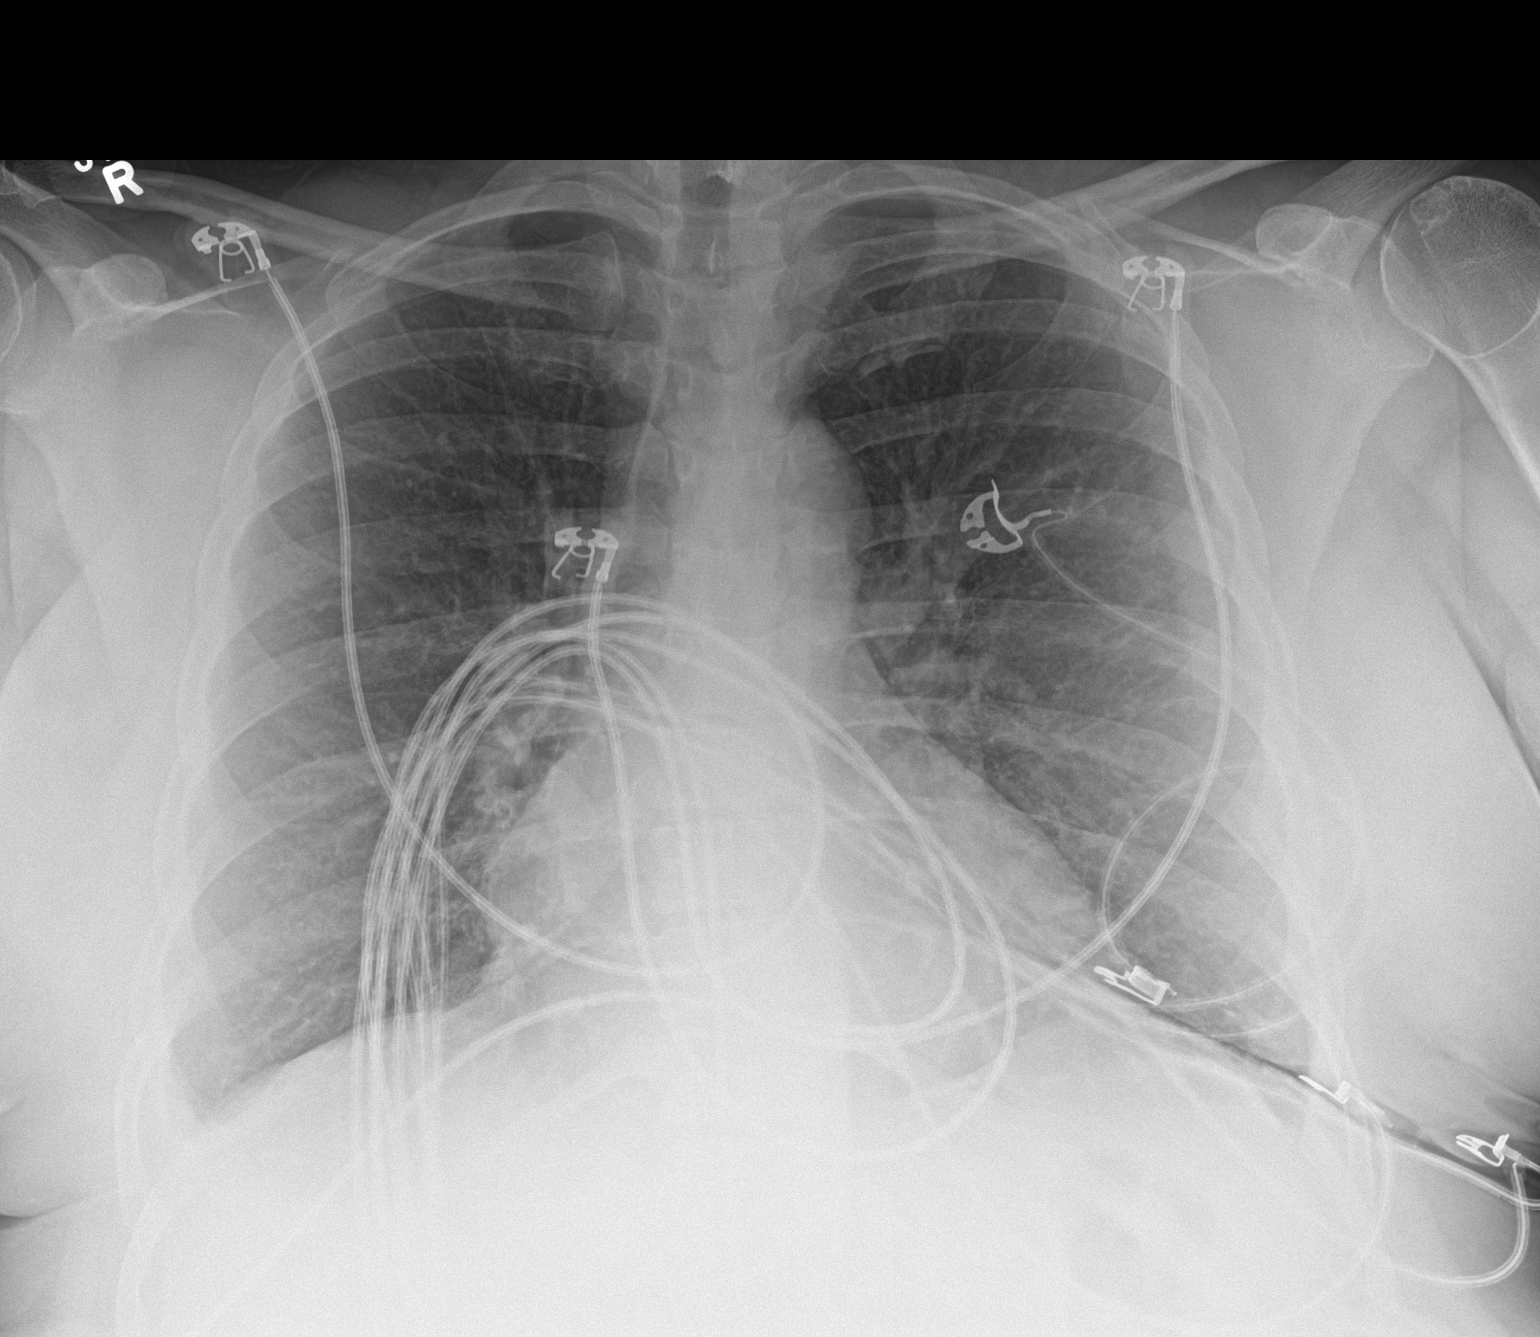

[1 of 1 positions shown; findings below may reference images not displayed]

FINDINGS: The heart size is stable. There is mild volume overload without
overt pulmonary edema. There may be a trace right-sided pleural
effusion. There is no pneumothorax. No acute osseous abnormality.
IMPRESSION: Mild volume overload with a trace right-sided pleural effusion.

## 2019-03-02 MED ORDER — HEPARIN BOLUS VIA INFUSION
5000.0000 [IU] | Freq: Once | INTRAVENOUS | Status: AC
  Administered 2019-03-02: 5000 [IU] via INTRAVENOUS
  Filled 2019-03-02: qty 5000

## 2019-03-02 MED ORDER — HEPARIN (PORCINE) 25000 UT/250ML-% IV SOLN
1400.0000 [IU]/h | INTRAVENOUS | Status: DC
  Administered 2019-03-02: 1400 [IU]/h via INTRAVENOUS
  Filled 2019-03-02: qty 250

## 2019-03-02 MED ORDER — IOHEXOL 350 MG/ML SOLN
100.0000 mL | Freq: Once | INTRAVENOUS | Status: AC | PRN
  Administered 2019-03-02: 100 mL via INTRAVENOUS

## 2019-03-02 MED ORDER — RIVAROXABAN (XARELTO) VTE STARTER PACK (15 & 20 MG): ORAL_TABLET | ORAL | 0 refills | Status: DC

## 2019-03-02 MED ORDER — RIVAROXABAN (XARELTO) EDUCATION KIT FOR DVT/PE PATIENTS
PACK | Status: DC
  Filled 2019-03-02: qty 1

## 2019-03-02 MED ORDER — RIVAROXABAN 15 MG PO TABS
15.0000 mg | ORAL_TABLET | Freq: Once | ORAL | Status: AC
  Administered 2019-03-02: 15 mg via ORAL
  Filled 2019-03-02 (×3): qty 1

## 2019-03-02 MED ORDER — LIDOCAINE 5 % EX PTCH: 1.0000 | MEDICATED_PATCH | CUTANEOUS | 0 refills | Status: DC

## 2019-03-02 NOTE — ED Provider Notes (Signed)
[facility] EMERGENCY DEPARTMENT Provider Note   CSN: [id number] Arrival date & time: 03/02/19  1456  History   Chief Complaint Chief Complaint  Patient presents with   Chest Pain    HPI Tiffany Hubbard is a 43 y.o. female with a hx of asthma who presents to the ED with multiple complaints of intermittent chest pain since yesterday. Patient reports pain is located just left of the sternum, it is described as sharp & episodic. She states pain has occurred approximately 14-15 times total lasting about 30-40 seconds with each episode.  Notes pain is specific to exertion and is associated w/ dyspnea, resolved with rest, no other alleviating/aggravating factors. She also notes that over the past 1-2 weeks she has dyspnea w/ exertion limiting some activites & has had waxing/waning swelling to the ankles (L>R) with weight fluctuation, states her baseline weight is usually 230slbs but over weekend was in the 250slbs. Seems to be a bit better today. Denies nausea, vomiting, diaphoresis, or syncope. Notes early family hx of CAD- father w/ MI @ age 69.  Denies PND or orthopnea. Denies leg pain, hemoptysis, recent surgery/trauma, recent long travel, hormone use, personal hx of cancer, or hx of DVT/PE. Denies fever, chills, or productive cough.   Patient also mentions intermittent right lower back pain x 1 week- specific to position changes & with ambulation otherwise no pain. Has had some urinary sxs including frequency/urgency for about the same time. Telemedicine visit w/ PCP 2 days prior and started on Bactrim BID for 5 days, has had 4 doses thus far. Denies numbness, weakness, saddle anesthesia, incontinence to bowel/bladder, fever, chills, IV drug use, dysuria, or hx of cancer. Patient has not had prior back surgeries. No traumatic back injuries.        HPI  Past Medical History:  Diagnosis Date   Asthma    Bronchitis     Patient Active Problem List   Diagnosis Date Noted    Eczema 10/31/2013   Family history of breast cancer in mother 10/22/2012   Family history of colon cancer 10/22/2012   Screening for malignant neoplasm of cervix 10/22/2012   Thyromegaly 06/10/2012   Lymphadenopathy, submandibular 06/10/2012   Physical exam, annual 06/10/2012    Past Surgical History:  Procedure Laterality Date   ECTOPIC PREGNANCY SURGERY       OB History   No obstetric history on file.      Home Medications    Prior to Admission medications   Medication Sig Start Date End Date Taking? Authorizing Provider  ALLEGRA-D ALLERGY & CONGESTION 60-120 MG 12 hr tablet Take 1 tablet by mouth 2 (two) times daily. 02/17/18   Debbrah Alar, NP  Aspirin-Acetaminophen-Caffeine (GOODYS EXTRA STRENGTH PO) Take 2 packets by mouth every 4 (four) hours as needed (for pain).     [provider]  betamethasone dipropionate (DIPROLENE) 0.05 % cream Apply topically 2 (two) times daily. 02/03/18   Debbrah Alar, NP  Ferrous Sulfate (IRON) 325 (65 Fe) MG TABS Take 1 tablet (325 mg total) by mouth 2 (two) times daily. 02/04/18   Debbrah Alar, NP  Fluocinolone Acetonide 0.01 % OIL Place 2 drops in ear(s) daily as needed. 01/21/17   Debbrah Alar, NP  fluticasone (FLONASE) 50 MCG/ACT nasal spray Place 2 sprays into both nostrils daily. 02/03/18   Debbrah Alar, NP  furosemide (LASIX) 20 MG tablet Take 1 tablet (20 mg total) by mouth daily as needed. For swelling 03/01/19   Debbrah Alar, NP  meloxicam (MOBIC) 7.5 MG tablet Take 1 tablet (7.5 mg total) by mouth daily. 03/01/19   Debbrah Alar, NP  omeprazole (PRILOSEC) 40 MG capsule Take 1 capsule (40 mg total) by mouth daily. 12/31/18   Debbrah Alar, NP  sulfamethoxazole-trimethoprim (BACTRIM DS) 800-160 MG tablet TAKE 1 TABLET BY MOUTH EVERY 12 HOURS FOR 5 DAYS 02/28/19   [provider]    Family History Family History  Problem Relation Age of Onset   Lung cancer Mother     Breast cancer Mother 59   Diabetes Mother    Heart disease Father 24   Hypertension Father    Diabetes Maternal Grandmother    Stomach cancer Maternal Grandmother    Arthritis Maternal Grandfather    Colon cancer Maternal Aunt        2 aunts   Colon cancer Maternal Uncle     Social History Social History   Tobacco Use   Smoking status: Never Smoker   Smokeless tobacco: Never Used  Substance Use Topics   Alcohol use: Yes    Comment: occasionally   Drug use: No     Allergies   Patient has no known allergies.   Review of Systems Review of Systems  Constitutional: Negative for chills and fever.  Respiratory: Positive for shortness of breath. Negative for cough and wheezing.   Cardiovascular: Positive for chest pain and leg swelling.  Gastrointestinal: Negative for abdominal pain, blood in stool, constipation, diarrhea, nausea and vomiting.  Genitourinary: Positive for frequency and urgency. Negative for dysuria, vaginal bleeding and vaginal discharge.  Musculoskeletal: Positive for back pain.  Neurological: Negative for weakness and numbness.       Negative for incontinence.   All other systems reviewed and are negative.   Physical Exam Updated Vital Signs BP (!) 167/101    Pulse 84    Temp 98.2 F (36.8 C) (Oral)    Resp 16    SpO2 97%   Physical Exam Vitals signs and nursing note reviewed.  Constitutional:      General: She is not in acute distress.    Appearance: She is well-developed. She is not toxic-appearing.  HENT:     Head: Normocephalic and atraumatic.  Eyes:     General:        Right eye: No discharge.        Left eye: No discharge.     Conjunctiva/sclera: Conjunctivae normal.  Neck:     Musculoskeletal: Normal range of motion and neck supple. No spinous process tenderness or muscular tenderness.  Cardiovascular:     Rate and Rhythm: Normal rate and regular rhythm.     Pulses:          Dorsalis pedis pulses are 2+ on the right  side and 2+ on the left side.       Posterior tibial pulses are 2+ on the right side and 2+ on the left side.  Pulmonary:     Effort: Pulmonary effort is normal. No respiratory distress.     Breath sounds: Normal breath sounds. No wheezing, rhonchi or rales.  Abdominal:     General: There is no distension.     Palpations: Abdomen is soft.     Tenderness: There is no abdominal tenderness. There is no guarding or rebound.  Musculoskeletal:     Comments: Lower Extremities: Mild swelling noted to the ankles and distal lower legs bilaterally with L >R. No significant edema. No erythema/warmth. Normal ROM. Nontender, specifically no calf tenderness. Soft compartments.  Back: No point/focal vertebral tenderness, no palpable step off or crepitus. Some tenderness to the R lumbar paraspinal muscles laterally.   Skin:    General: Skin is warm and dry.     Findings: No rash.  Neurological:     Mental Status: She is alert.     Deep Tendon Reflexes:     Reflex Scores:      Patellar reflexes are 2+ on the right side and 2+ on the left side.    Comments: Sensation grossly intact to bilateral lower extremities. 5/5 symmetric strength with plantar/dorsiflexion bilaterally. Gait is intact without obvious foot drop.   Psychiatric:        Behavior: Behavior normal.      ED Treatments / Results  Labs (all labs ordered are listed, but only abnormal results are displayed) Labs Reviewed  CBC WITH DIFFERENTIAL/PLATELET - Abnormal; Notable for the following components:      Result Value   Hemoglobin 11.4 (*)    MCV 79.5 (*)    MCH 24.9 (*)    RDW 17.6 (*)    Platelets 475 (*)    All other components within normal limits  COMPREHENSIVE METABOLIC PANEL - Abnormal; Notable for the following components:   CO2 17 (*)    Calcium 8.6 (*)    Total Protein 6.3 (*)    Albumin 2.4 (*)    All other components within normal limits  URINALYSIS, ROUTINE W REFLEX MICROSCOPIC - Abnormal; Notable for the following  components:   APPearance HAZY (*)    Protein, ur >=300 (*)    Bacteria, UA RARE (*)    All other components within normal limits  D-DIMER, QUANTITATIVE (NOT AT [facility]) - Abnormal; Notable for the following components:   D-Dimer, Quant 1.97 (*)    All other components within normal limits  BRAIN NATRIURETIC PEPTIDE - Abnormal; Notable for the following components:   B Natriuretic Peptide 109.0 (*)    All other components within normal limits  SARS CORONAVIRUS 2 (HOSPITAL ORDER, [facility])  URINE CULTURE  TROPONIN I  TROPONIN I  PROTIME-INR  HEPARIN LEVEL (UNFRACTIONATED)  CBC  I-STAT BETA HCG BLOOD, ED ([facility], [facility], [facility] ONLY)    EKG EKG Interpretation  Date/Time:  Wednesday Mar 02 2019 15:04:53 EDT Ventricular Rate:  87 PR Interval:    QRS Duration: 85 QT Interval:  367 QTC Calculation: 442 R Axis:   3 Text Interpretation:  Sinus rhythm Confirmed by Dene Gentry [phone]) on 03/02/2019 3:15:45 PM   Radiology Ct Angio Chest Pe W/cm &/or Wo Cm  Result Date: 03/02/2019 CLINICAL DATA:  Shortness of breath.  Sharp left-sided chest pain. EXAM: CT ANGIOGRAPHY CHEST WITH CONTRAST TECHNIQUE: Multidetector CT imaging of the chest was performed using the standard protocol during bolus administration of intravenous contrast. Multiplanar CT image reconstructions and MIPs were obtained to evaluate the vascular anatomy. CONTRAST:  115mL OMNIPAQUE IOHEXOL 350 MG/ML SOLN COMPARISON:  None. FINDINGS: Cardiovascular: There is an acute pulmonary embolus involving a segmental branch of the left upper lobe (axial series 11, image 156). There may be additional smaller subsegmental pulmonary emboli involving the left lower lobe. There is mild cardiac enlargement. No CT evidence of heart strain. Mediastinum/Nodes: No enlarged mediastinal, hilar, or axillary lymph nodes. Thyroid gland, trachea, and esophagus demonstrate no significant findings. Lungs/Pleura: There is a small right-sided  pleural effusion. The lung volumes are somewhat low. There is a slight mosaic appearance of all of the lobe. Upper Abdomen: There  is a complex 2.5 cm left adrenal nodule. The remaining portions of the upper abdomen are unremarkable. Musculoskeletal: No chest wall abnormality. No acute or significant osseous findings. Review of the MIP images confirms the above findings. IMPRESSION: 1. Acute segmental pulmonary embolus involving the left upper lobe. No evidence of heart strain. 2. The lungs are clear aside from a small right-sided pleural effusion. 3. Mildly enlarged heart. 4. Indeterminate 2.5 cm left adrenal nodule. Follow-up with a nonemergent outpatient adrenal mass protocol MRI or CT is recommended. These results were called by telephone at the time of interpretation on 03/02/2019 at 7:47 pm to [facility] , who verbally acknowledged these results. Electronically Signed   By: Constance Holster M.D.   On: 03/02/2019 19:55   Dg Chest Portable 1 View  Result Date: 03/02/2019 CLINICAL DATA:  Chest pain and dyspnea EXAM: PORTABLE CHEST 1 VIEW COMPARISON:  12/31/2018 FINDINGS: The heart size is stable. There is mild volume overload without overt pulmonary edema. There may be a trace right-sided pleural effusion. There is no pneumothorax. No acute osseous abnormality. IMPRESSION: Mild volume overload with a trace right-sided pleural effusion. Electronically Signed   By: Constance Holster M.D.   On: 03/02/2019 16:01   Vas [place] Lower Extremity Venous (dvt) (only Mc & Wl 7a-7p)  Result Date: 03/02/2019  Lower Venous Study Indications: Edema, SOB, and Chest pain.  Risk Factors: Obesity. Performing Technologist: Toma Copier RVS  Examination Guidelines: A complete evaluation includes B-mode imaging, spectral Doppler, color Doppler, and power Doppler as needed of all accessible portions of each vessel. Bilateral testing is considered an integral part of a complete examination. Limited examinations  for reoccurring indications may be performed as noted.  +---------+---------------+---------+-----------+----------+-------+  RIGHT     Compressibility Phasicity Spontaneity Properties Summary  +---------+---------------+---------+-----------+----------+-------+  CFV       Full            Yes       Yes                             +---------+---------------+---------+-----------+----------+-------+  SFJ       Full                                                      +---------+---------------+---------+-----------+----------+-------+  FV Prox   Full            Yes       Yes                             +---------+---------------+---------+-----------+----------+-------+  FV Mid    Full                                                      +---------+---------------+---------+-----------+----------+-------+  FV Distal Full            Yes       Yes                             +---------+---------------+---------+-----------+----------+-------+  PFV  Full            Yes       Yes                             +---------+---------------+---------+-----------+----------+-------+  POP       Full            Yes       Yes                             +---------+---------------+---------+-----------+----------+-------+  PTV       Full                                                      +---------+---------------+---------+-----------+----------+-------+  PERO      Full                                                      +---------+---------------+---------+-----------+----------+-------+   +---------+---------------+---------+-----------+----------+-------+  LEFT      Compressibility Phasicity Spontaneity Properties Summary  +---------+---------------+---------+-----------+----------+-------+  CFV       Full            Yes       Yes                             +---------+---------------+---------+-----------+----------+-------+  SFJ       Full                                                       +---------+---------------+---------+-----------+----------+-------+  FV Prox   Full            Yes       Yes                             +---------+---------------+---------+-----------+----------+-------+  FV Mid    Full                                                      +---------+---------------+---------+-----------+----------+-------+  FV Distal Full            Yes       Yes                             +---------+---------------+---------+-----------+----------+-------+  PFV       Full            Yes       Yes                             +---------+---------------+---------+-----------+----------+-------+  POP  Full            Yes       Yes                             +---------+---------------+---------+-----------+----------+-------+  PTV       Full                                                      +---------+---------------+---------+-----------+----------+-------+  PERO      Full                                                      +---------+---------------+---------+-----------+----------+-------+     Summary: Right: There is no evidence of deep vein thrombosis in the lower extremity. No cystic structure found in the popliteal fossa. Left: There is no evidence of deep vein thrombosis in the lower extremity. No cystic structure found in the popliteal fossa.  *See table(s) above for measurements and observations.    Preliminary     Procedures Procedures (including critical care time) CRITICAL CARE Performed by: Kennith Maes   Total critical care time: 30 minutes  Critical care time was exclusive of separately billable procedures and treating other patients.  Critical care was necessary to treat or prevent imminent or life-threatening deterioration.  Critical care was time spent personally by me on the following activities: development of treatment plan with patient and/or surrogate as well as nursing, discussions with consultants, evaluation of patient's response to  treatment, examination of patient, obtaining history from patient or surrogate, ordering and performing treatments and interventions, ordering and review of laboratory studies, ordering and review of radiographic studies, pulse oximetry and re-evaluation of patient's condition.  Medications Ordered in ED Medications  heparin ADULT infusion 100 units/mL (25000 units/257mL sodium chloride 0.45%) (1,400 Units/hr Intravenous New Bag/Given 03/02/19 2122)  iohexol (OMNIPAQUE) 350 MG/ML injection 100 mL (100 mLs Intravenous Contrast Given 03/02/19 1913)  heparin bolus via infusion 5,000 Units (5,000 Units Intravenous Bolus from Bag 03/02/19 2123)     Initial Impression / Assessment and Plan / ED Course  I have reviewed the triage vital signs and the nursing notes.  Pertinent labs & imaging results that were available during my care of the patient were reviewed by me and considered in my medical decision making (see chart for details).  Patient presents to the ED with complaints of intermittent chest pain since yesterday with exertional dyspnea & waxing/waning ankle swelling x 1-2 weeks. Exam is overall fairly benign. Heart RRR. Lungs CTA. No significant pitting edema to the lower extremities. Plan for labs to include trop, BNP, d-dimer, imaging, and EKG.   Work-up reviewed:  CBC: Mild anemia- PCP recheck. No leukocytosis.  CMP: Mild hypocalcemia and hypoalbuminemia. Bicarb low at 17. Renal function preserved. LFTs WNL.  UA: proteinuria- PCP recheck. Rare bacteria, not consistent w/ UTI- complete bactrim by PCP, Urine for culture.  D-dimer elevated---> subsequently obtained bilateral venous duplex study- negative for DVT, CTA obtained + for acute segmental PE involving the left upper lobe, no evidence of heart strain, small right sided pleural effusion, mildly enlarged heart. Adrenal nodule- PCP follow  up.   Consult for admission.   Discussed w/ hospitalist Dr. Posey Pronto- has evaluated patient, feels she  is appropriate for discharge home with Xarelto vs. Eliquis, has placed consult note with recommendations- appreciate his input & evaluation of patient, I feel this is reasonable. Discussed w/ phamacist Hughes Better, will give first dose of xarelto in the ER and discharge home w/ Xarelto pack, pharmacy will discuss w/ patient & provide medication education.   Patient also mentioned some back pain & urinary sxs- back pain seems musculoskeletal in nature- would consider pyelo but seems less likely, on bactrim. No back pain red flags, no neuro deficits- do not suspect cauda equina syndrome or spinal infection. Lidoderm patches for this w/ recommendations for application of heat.   Discharge home with above medications, PCP follow up, and strict return precautions. I discussed results, treatment plan, need for follow-up, and return precautions with the patient. Provided opportunity for questions, patient confirmed understanding and is in agreement with plan.   Findings and plan of care discussed with supervising physician Dr. Francia Greaves who is in agreement.   Final Clinical Impressions(s) / ED Diagnoses   Final diagnoses:  Acute pulmonary embolism without acute cor pulmonale, unspecified pulmonary embolism type [facility])    ED Discharge Orders         Ordered    Rivaroxaban 15 & 20 MG TBPK     03/02/19 2151    lidocaine (LIDODERM) 5 %  Every 24 hours     03/02/19 2209           Amaryllis Dyke, PA-C 03/03/19 0048    Valarie Merino, MD 03/08/19 1453

## 2019-03-02 NOTE — ED Notes (Signed)
Vascular at bedside

## 2019-03-02 NOTE — Progress Notes (Signed)
Bilateral lower extremity venous duplex completed. Results in Chart review CV Proc. And given to the RN. [place] Delaynie Stetzer,RVS 03/02/2019, 6:10 PM

## 2019-03-02 NOTE — ED Notes (Signed)
Patient transported to CT 

## 2019-03-02 NOTE — ED Triage Notes (Signed)
Pt presents to the ED with chest pain, shortness of breath and lower back pain for about a week. Pt reports it is a sharp pain on her left chest that comes and goes. Pt denies any pain at present time. Pt also reports increased ankle swelling bilaterally. Pt is currently taking bactrim for a UTI.

## 2019-03-02 NOTE — Consult Note (Signed)
[facility] Medical Consultation  Tiffany Hubbard LOV:[id number] DOB: May 06, 1976 DOA: 03/02/2019 PCP: Debbrah Alar, NP   Requesting physician: Kennith Maes, PA-C; Dene Gentry, MD Date of consultation: 0527/2020 Reason for consultation: Acute segmental pulmonary embolism of the left upper lobe  Impression/Recommendations Active Problems:   Pulmonary embolism on left [facility])   Acute segmental pulmonary embolism of the left upper lobe: CTA shows an acute central PE of the left upper lobe, which is likely cause of patient's recent dyspnea on exertion and left-sided pleuritic chest pain.  Preliminary lower extremity venous Dopplers are negative for DVT.  Troponin I is negative x2 and EKG is without acute ischemic changes.  She has normal renal function.  I discussed the risks/benefits of anticoagulation for management of her acute PE.  Vital signs are stable without hypoxia and she has no difficulty with oral intake.  I think it would be reasonable to discharge her home on oral anticoagulation such as Xarelto or Eliquis, which ever would be easier/more affordable for her to obtain.  She is agreeable to this plan as well as the ED provider so I discussed the case with, Kennith Maes, PA and Dr. Francia Greaves.   Left adrenal nodule: Indeterminate 2.5 cm left adrenal nodule seen on CT imaging.  Nonemergent outpatient adrenal mass protocol MRI or CT is recommended.   Chief Complaint: Dyspnea on exertion, swelling of legs, pleuritic left-sided chest pain  HPI:  Patient is a 43 year old female with medical history significant of well-controlled asthma who presents the ED for evaluation of swelling in both of her legs, dyspnea on exertion, and left-sided pleuritic chest pain.  Patient reports experiencing right lower flank pain about 1 week ago associated with urinary symptoms including frequency and urgency.  Around the same time she had noticed swelling in both of her legs which was  reportedly greater on the left compared to the right.  She had a telemedicine visit with her PCP 2 days ago and was started on empiric Bactrim for presumed UTI which she has been taking to complete a 5-day total course.  During this time she has noticed having shortness of breath when walking up a flight of stairs or walking several blocks which is new for her.  She would have intermittent sharp left-sided chest pain during these exertional activities.  She denies any symptoms at rest or orthopnea.  She is not currently taking any medications.  She denies any history of tobacco use or illicit drug use.  She reports occasional alcohol use.  She denies any personal or family history of blood clots.  She denies any obvious bleeding including epistaxis, hemoptysis, hematemesis, hematuria, or hematochezia.  She denies any associated nausea, vomiting, fevers, chills, cough, injury, or recent surgery.  ED Course: Initial vitals showed BP 158/92, pulse 75, RR 16, temp 98.2 Fahrenheit, SPO2 97% on room air.  Labs are notable for WBC 6.6, hemoglobin 11.4, platelets 475,000, BUN 11, creatinine 0.91, BNP 109.0, troponin I <0.03 x2, beta hCG <5.0, SARS-CoV-2 test negative.  D-dimer was 1.97.  Bilateral lower extremity venous Dopplers were obtained with preliminary read negative for DVTs.  Portable chest x-ray was without overt pulmonary edema.  CTA chest PE study showed an acute segmental pulmonary embolus involving the left upper lobe without CT evidence of heart strain.  An indeterminate 2.5 cm left adrenal nodule was also seen.  The hospitalist service was consulted for further evaluation.   Review of Systems:  All systems reviewed and are negative except as documented in  history of present illness above.  Past Medical History:  Diagnosis Date  . Asthma   . Bronchitis    Past Surgical History:  Procedure Laterality Date  . ECTOPIC PREGNANCY SURGERY     Social History:  reports that she has never  smoked. She has never used smokeless tobacco. She reports current alcohol use. She reports that she does not use drugs.  No Known Allergies Family History  Problem Relation Age of Onset  . Lung cancer Mother   . Breast cancer Mother 51  . Diabetes Mother   . Heart disease Father 66  . Hypertension Father   . Diabetes Maternal Grandmother   . Stomach cancer Maternal Grandmother   . Arthritis Maternal Grandfather   . Colon cancer Maternal Aunt        2 aunts  . Colon cancer Maternal Uncle     Prior to Admission medications   Medication Sig Start Date End Date Taking? Authorizing Provider  ALLEGRA-D ALLERGY & CONGESTION 60-120 MG 12 hr tablet Take 1 tablet by mouth 2 (two) times daily. 02/17/18   Debbrah Alar, NP  Aspirin-Acetaminophen-Caffeine (GOODYS EXTRA STRENGTH PO) Take 2 packets by mouth every 4 (four) hours as needed (for pain).     [provider]  betamethasone dipropionate (DIPROLENE) 0.05 % cream Apply topically 2 (two) times daily. 02/03/18   Debbrah Alar, NP  Ferrous Sulfate (IRON) 325 (65 Fe) MG TABS Take 1 tablet (325 mg total) by mouth 2 (two) times daily. 02/04/18   Debbrah Alar, NP  Fluocinolone Acetonide 0.01 % OIL Place 2 drops in ear(s) daily as needed. 01/21/17   Debbrah Alar, NP  fluticasone (FLONASE) 50 MCG/ACT nasal spray Place 2 sprays into both nostrils daily. 02/03/18   Debbrah Alar, NP  furosemide (LASIX) 20 MG tablet Take 1 tablet (20 mg total) by mouth daily as needed. For swelling 03/01/19   Debbrah Alar, NP  meloxicam (MOBIC) 7.5 MG tablet Take 1 tablet (7.5 mg total) by mouth daily. 03/01/19   Debbrah Alar, NP  omeprazole (PRILOSEC) 40 MG capsule Take 1 capsule (40 mg total) by mouth daily. 12/31/18   Debbrah Alar, NP  sulfamethoxazole-trimethoprim (BACTRIM DS) 800-160 MG tablet TAKE 1 TABLET BY MOUTH EVERY 12 HOURS FOR 5 DAYS 02/28/19   [provider]   Physical Exam: Blood pressure (!)  153/92, pulse 83, temperature 98.2 F (36.8 C), temperature source Oral, resp. rate 18, SpO2 98 %. Vitals:   03/02/19 2031 03/02/19 2055  BP: (!) 179/95 (!) 153/92  Pulse: 74 83  Resp: 15 18  Temp:    SpO2: 98% 98%     General: Resting supine in bed, no acute distress, alert and cooperative  Eyes: PERRL, conjunctive normal  ENT: Normal dentition  Neck: Supple  Cardiovascular: Regular rate and rhythm, no murmurs/rubs/gallops.  Trace nonpitting edema of both legs.  Respiratory: Clear to auscultation, normal respiratory pattern, no wheezing or crackles present.  Abdomen: Soft, nontender, positive bowel sounds.  Skin: No rashes, erythema, or induration.  Musculoskeletal: Intact range of motion, no contractures  Psychiatric: Normal mood, alert and oriented x3.  Neurologic: Cranial nerves II-XII intact.  Strength 5 of 5 in all extremities.  Sensation intact.  Labs on Admission:  Basic Metabolic Panel: Recent Labs  Lab 03/02/19 1522  NA 135  K 4.4  CL 108  CO2 17*  GLUCOSE 78  BUN 11  CREATININE 0.91  CALCIUM 8.6*   Liver Function Tests: Recent Labs  Lab 03/02/19 1522  AST 21  ALT 21  ALKPHOS 64  BILITOT 0.6  PROT 6.3*  ALBUMIN 2.4*   No results for input(s): LIPASE, AMYLASE in the last 168 hours. No results for input(s): AMMONIA in the last 168 hours. CBC: Recent Labs  Lab 03/02/19 1522  WBC 6.6  NEUTROABS 4.1  HGB 11.4*  HCT 36.4  MCV 79.5*  PLT 475*   Cardiac Enzymes: Recent Labs  Lab 03/02/19 1522 03/02/19 1831  TROPONINI <0.03 <0.03   BNP: Invalid input(s): POCBNP CBG: No results for input(s): GLUCAP in the last 168 hours.  Radiological Exams on Admission: Ct Angio Chest Pe W/cm &/or Wo Cm  Result Date: 03/02/2019 CLINICAL DATA:  Shortness of breath.  Sharp left-sided chest pain. EXAM: CT ANGIOGRAPHY CHEST WITH CONTRAST TECHNIQUE: Multidetector CT imaging of the chest was performed using the standard protocol during bolus  administration of intravenous contrast. Multiplanar CT image reconstructions and MIPs were obtained to evaluate the vascular anatomy. CONTRAST:  129mL OMNIPAQUE IOHEXOL 350 MG/ML SOLN COMPARISON:  None. FINDINGS: Cardiovascular: There is an acute pulmonary embolus involving a segmental branch of the left upper lobe (axial series 11, image 156). There may be additional smaller subsegmental pulmonary emboli involving the left lower lobe. There is mild cardiac enlargement. No CT evidence of heart strain. Mediastinum/Nodes: No enlarged mediastinal, hilar, or axillary lymph nodes. Thyroid gland, trachea, and esophagus demonstrate no significant findings. Lungs/Pleura: There is a small right-sided pleural effusion. The lung volumes are somewhat low. There is a slight mosaic appearance of all of the lobe. Upper Abdomen: There is a complex 2.5 cm left adrenal nodule. The remaining portions of the upper abdomen are unremarkable. Musculoskeletal: No chest wall abnormality. No acute or significant osseous findings. Review of the MIP images confirms the above findings. IMPRESSION: 1. Acute segmental pulmonary embolus involving the left upper lobe. No evidence of heart strain. 2. The lungs are clear aside from a small right-sided pleural effusion. 3. Mildly enlarged heart. 4. Indeterminate 2.5 cm left adrenal nodule. Follow-up with a nonemergent outpatient adrenal mass protocol MRI or CT is recommended. These results were called by telephone at the time of interpretation on 03/02/2019 at 7:47 pm to [facility] , who verbally acknowledged these results. Electronically Signed   By: Constance Holster M.D.   On: 03/02/2019 19:55   Dg Chest Portable 1 View  Result Date: 03/02/2019 CLINICAL DATA:  Chest pain and dyspnea EXAM: PORTABLE CHEST 1 VIEW COMPARISON:  12/31/2018 FINDINGS: The heart size is stable. There is mild volume overload without overt pulmonary edema. There may be a trace right-sided pleural effusion.  There is no pneumothorax. No acute osseous abnormality. IMPRESSION: Mild volume overload with a trace right-sided pleural effusion. Electronically Signed   By: Constance Holster M.D.   On: 03/02/2019 16:01   Vas [place] Lower Extremity Venous (dvt) (only Mc & Wl 7a-7p)  Result Date: 03/02/2019  Lower Venous Study Indications: Edema, SOB, and Chest pain.  Risk Factors: Obesity. Performing Technologist: Toma Copier RVS  Examination Guidelines: A complete evaluation includes B-mode imaging, spectral Doppler, color Doppler, and power Doppler as needed of all accessible portions of each vessel. Bilateral testing is considered an integral part of a complete examination. Limited examinations for reoccurring indications may be performed as noted.  +---------+---------------+---------+-----------+----------+-------+ RIGHT    CompressibilityPhasicitySpontaneityPropertiesSummary +---------+---------------+---------+-----------+----------+-------+ CFV      Full           Yes      Yes                          +---------+---------------+---------+-----------+----------+-------+  SFJ      Full                                                 +---------+---------------+---------+-----------+----------+-------+ FV Prox  Full           Yes      Yes                          +---------+---------------+---------+-----------+----------+-------+ FV Mid   Full                                                 +---------+---------------+---------+-----------+----------+-------+ FV DistalFull           Yes      Yes                          +---------+---------------+---------+-----------+----------+-------+ PFV      Full           Yes      Yes                          +---------+---------------+---------+-----------+----------+-------+ POP      Full           Yes      Yes                          +---------+---------------+---------+-----------+----------+-------+ PTV      Full                                                  +---------+---------------+---------+-----------+----------+-------+ PERO     Full                                                 +---------+---------------+---------+-----------+----------+-------+   +---------+---------------+---------+-----------+----------+-------+ LEFT     CompressibilityPhasicitySpontaneityPropertiesSummary +---------+---------------+---------+-----------+----------+-------+ CFV      Full           Yes      Yes                          +---------+---------------+---------+-----------+----------+-------+ SFJ      Full                                                 +---------+---------------+---------+-----------+----------+-------+ FV Prox  Full           Yes      Yes                          +---------+---------------+---------+-----------+----------+-------+ FV Mid   Full                                                 +---------+---------------+---------+-----------+----------+-------+  FV DistalFull           Yes      Yes                          +---------+---------------+---------+-----------+----------+-------+ PFV      Full           Yes      Yes                          +---------+---------------+---------+-----------+----------+-------+ POP      Full           Yes      Yes                          +---------+---------------+---------+-----------+----------+-------+ PTV      Full                                                 +---------+---------------+---------+-----------+----------+-------+ PERO     Full                                                 +---------+---------------+---------+-----------+----------+-------+     Summary: Right: There is no evidence of deep vein thrombosis in the lower extremity. No cystic structure found in the popliteal fossa. Left: There is no evidence of deep vein thrombosis in the lower extremity. No cystic structure found in  the popliteal fossa.  *See table(s) above for measurements and observations.    Preliminary     EKG: Independently reviewed.  Sinus rhythm without acute ischemic changes.   Zada Finders, MD [facility]  If 7PM-7AM, please contact night-coverage [email or web address] 03/02/2019, 9:36 PM

## 2019-03-02 NOTE — Progress Notes (Addendum)
ANTICOAGULATION CONSULT NOTE - Initial Consult  Pharmacy Consult for heparin Indication: pulmonary embolus   Patient Measurements: Heparin Dosing Weight: 85 kg  Vital Signs: Temp: 98.2 F (36.8 C) (05/27 1507) Temp Source: Oral (05/27 1507) BP: 167/101 (05/27 2006) Pulse Rate: 84 (05/27 2006)  Labs: Recent Labs    03/02/19 1522 03/02/19 1831  HGB 11.4*  --   HCT 36.4  --   PLT 475*  --   CREATININE 0.91  --   TROPONINI <0.03 <0.03    Assessment: 43 year old female presents with intermittent chest pain. CTA shows segmental PE in left upper lobe. Planning to initiate heparin. CBC stable and wnl.    Goal of Therapy:  Heparin level 0.3-0.7 units/ml Monitor platelets by anticoagulation protocol: Yes    Plan:  -Heparin bolus 5000 units x1 then 1400 units/hr -Daily HL, CBC -F/u plan for oral anticoagulation   Jadian Karman, Jake Church 03/02/2019,8:19 PM

## 2019-03-02 NOTE — ED Notes (Signed)
This RN spoke with CT to find out about patient wait for CT. CT states they will look for patient order.

## 2019-03-02 NOTE — Discharge Instructions (Signed)
You are seen in the emergency department today for chest pain and trouble breathing.  Your CT scan showed that you have a pulmonary embolism, otherwise noticed a blood clot in your lung, please see the attached handout for further information regarding this diagnosis.  Your heart is mildly enlarged and there is some fluid at the bottom of your right lung, this is likely related to the blood clot.  We are starting you on Xarelto, this is a blood thinner, this is to treat the clot, you will need to take this as prescribed.  Being on a blood thinner makes you at higher risk for bleeding, if you are to hit your head, have a traumatic injury, noticed bruising/bleeding, have blood in your stool or urine, or coughing up blood is important that you return to the emergency department immediately.  While being on a blood thinner do not take NSAIDs (Ultram, ibuprofen, Advil, Aleve, naproxen, mobic, Goody powder, diclofenac, etc.) see more detailed information regarding this medicine below.   Your labs also show that your hemoglobin is bit low, please have this rechecked.  We sent her urine for culture to determine if Bactrim is the best antibiotic to treat the UTI that your primary care is treating.  Suspect your back pain is likely due to a muscular issue and are sending you home with Lidoderm patches, please use 1/day and area of discomfort.  Please also apply heat to this area of discomfort.  You may take Tylenol as well.  Please follow-up with your primary care provider within the next 3 days for reevaluation.  Return to the emergency department immediately for above reasons.  It is also important that you return to the emergency department should you experience new or worsening symptoms including but not limited to worsening shortness of breath, shortness of breath at rest, increased chest pain, persistent pain, change in quality of pain, coughing up blood, passing out, dizziness, lightheadedness, or any other  concerns.    Rivaroxaban (Xarelto) ED Discharge Instructions   Patient received a prescription for  Xarelto 15 & 20 mg - 51 tablet VTE STARTER PACK.   Patient understands only the FIRST 30 DAYS OF TREATMENT  will be provided by the starter pack.   Patient understands to contact primary care doctor or ED immediately if for any reason is unable to fill the starter pack prescription.  Patient must schedule a follow-up appointment with primary care doctor within 15 days of discharge in order to receive the maintenance prescription and clinical follow up.  Patient has received an education kit containing ([facility] Trial Offer Card, DVT/PE brochure, Dosing Diary, and Xarelto Medication Guide).   If not performed in the ED, patient will receive medication counseling by a [facility] pharmacist via phone follow-up within the next 72 hours. Pharmacist to review signs and symptoms of bleeding and proper use of this medication.   Call 911 or return immediately to the nearest ED if you develop bleeding (e.g. nose, gums, vomit, urine, bloody or dark stools), unusual bruising, head trauma (even if minor), severe headache, altered mental status, change in speech, weakness on one side of body, shortness of breath, swollen lips/tongue/face/neck, chest pain, or other concerns.    Information on my medicine - XARELTO (rivaroxaban)  This medication education was provided to me or my healthcare representative as part of my discharge instructions.   WHY WAS XARELTO PRESCRIBED FOR YOU?  Xarelto was prescribed to treat blood clots that may have been found in the  veins of your legs (deep vein thrombosis) or in your lungs (pulmonary embolism) and to reduce the risk of them occurring again.   WHAT DO YOU NEED TO KNOW ABOUT XARELTO?  The starting dose is one 15 mg tablet taken TWICE daily with food for the FIRST 21 DAYS then on day 22 the dose is changed to one 20 mg tablet taken ONCE A DAY with your evening  meal.   DO NOT stop taking Xarelto without talking to the health care provider who prescribed the medication. Refill your prescription for 20 mg tablets before you run out.  After discharge, you should have regular check-up appointments with your healthcare provider that is prescribing your Xarelto. In the future your dose may need to be changed if your kidney function changes by a significant amount.   WHAT DO YOU DO IF YOU MISS A DOSE?  If you are taking Xarelto TWICE DAILY and you miss a dose, take it as soon as you remember. You may take two 15 mg tablets (total 30 mg) at the same time then resume your regularly scheduled 15 mg twice daily the next day.   If you are taking Xarelto ONCE DAILY and you miss a dose, take it as soon as you remember on the same day then continue your regularly scheduled once daily regimen the next day. Do not take two doses of Xarelto at the same time.   IMPORTANT SAFETY INFORMATION  Xarelto is a blood thinner medicine that can cause bleeding. You should call your healthcare provider right away if you experience any of the following:  -  Bleeding from an injury or your nose that does not stop.  -  Unusual colored urine (red or dark brown) or unusual colored stools (red or black).  -  Unusual bruising for unknown reasons.  -  A serious fall or if you hit your head (even if there is no bleeding).   Some medicines may interact with Xarelto and might increase your risk of bleeding while on Xarelto. To help avoid this, consult your healthcare provider or pharmacist prior to using any new prescription or non-prescription medications, including herbals, vitamins, non-steroidal anti-inflammatory drugs (NSAIDs) and supplements.   This website has more information on Xarelto: [email or web address]

## 2019-03-03 ENCOUNTER — Ambulatory Visit: Payer: Self-pay | Admitting: *Deleted

## 2019-03-03 LAB — URINE CULTURE: Culture: <10000 — AB

## 2019-03-03 NOTE — Telephone Encounter (Signed)
Patient seen in [facility] ED yesterday and dx with LUL PE, started on Xarelto. Her question is Can she continue to take the furosemide and the meloxicam? Routing to PCP for advice. Please respond via MyChart.   Reason for Disposition . Caller has NON-URGENT medication question about med that PCP prescribed and triager unable to answer question  Answer Assessment - Initial Assessment Questions 1. SYMPTOMS: "Do you have any symptoms?"     no 2. SEVERITY: If symptoms are present, ask "Are they mild, moderate or severe?"     na  Protocols used: MEDICATION QUESTION CALL-A-AH

## 2019-03-03 NOTE — Telephone Encounter (Signed)
Please stop meloxicam and stop furosemide.  I would still like to see her in the office tomorrow.

## 2019-03-03 NOTE — Telephone Encounter (Signed)
Please advise 

## 2019-03-04 ENCOUNTER — Telehealth: Payer: Self-pay | Admitting: Family

## 2019-03-04 ENCOUNTER — Ambulatory Visit (INDEPENDENT_AMBULATORY_CARE_PROVIDER_SITE_OTHER): Payer: BLUE CROSS/BLUE SHIELD | Admitting: Family

## 2019-03-04 DIAGNOSIS — R030 Elevated blood-pressure reading, without diagnosis of hypertension: Secondary | ICD-10-CM

## 2019-03-04 DIAGNOSIS — E278 Other specified disorders of adrenal gland: Secondary | ICD-10-CM

## 2019-03-04 DIAGNOSIS — I2699 Other pulmonary embolism without acute cor pulmonale: Secondary | ICD-10-CM

## 2019-03-04 DIAGNOSIS — N3000 Acute cystitis without hematuria: Secondary | ICD-10-CM

## 2019-03-04 NOTE — Telephone Encounter (Signed)
See my chart message

## 2019-03-04 NOTE — Progress Notes (Signed)
Virtual Visit via Video Note  I connected with Tiffany Hubbard on [birth year] on 03/04/19 at  3:00 PM EDT by a video enabled telemedicine application and verified that I am speaking with the correct person using two identifiers. This visit type was conducted due to national recommendations for restrictions regarding the COVID-19 Pandemic (e.g. social distancing).  This format is felt to be most appropriate for this patient at this time.   I discussed the limitations of evaluation and management by telemedicine and the availability of in person appointments. The patient expressed understanding and agreed to proceed.  Only the patient and myself were on today's video visit. The patient was at home and I was in my office at the time of today's visit.   History of Present Illness:  Patient is a 43 yr old female who presents today for ED follow up. ED record was reviewed. She presented on 5/27 with left sided chest pain.  Work up revealed PE. Bilateral lower extremity ultrasound was negative for DVT.  Specifically note was made of an acute segmental pulmonary embolus involving the left upper lobe.  Incidental note was also noted to have an indeterminate 2.5 cm left adrenal nodule.  MRI or CT was recommended with adrenal mass protocol.  Today the patient reports that she is feeling much better.  Her shortness of breath and chest pain have resolved and she notes significant improvement in her lower extremity edema.  UTI-patient reports resolution of her dysuria and urinary symptoms.  Elevated blood pressure reading-this was noted during her visit to the emergency department. BP Readings from Last 3 Encounters:  03/02/19 (!) 153/92  02/03/18 136/75  12/22/17 (!) 141/80    Observations/Objective:   Gen: Awake, alert, no acute distress Resp: Breathing is even and non-labored Psych: calm/pleasant demeanor Neuro: Alert and Oriented x 3, + facial symmetry, speech is clear.  Assessment and  Plan: Pulmonary embolus- she is maintained on Xarelto.  Given the on precipitated pulmonary embolus I would like to refer her to hematology for further evaluation of possible underlying hypercoagulable state.  We discussed that she should discontinue Lasix at this time as well as meloxicam.  Specifically we discussed avoiding all NSAIDs while on Xarelto.  Adrenal nodule- will order MRI with adrenal mass protocol per radiology recommendations.  UTI-clinically resolved following treatment with Bactrim.  Elevated blood pressure reading-this was noted during her ED visit.  Suspect this was related to anxiety and underlying PE.  Plan to repeat blood pressure at her face-to-face visit in 3 weeks.  Follow Up Instructions:    I discussed the assessment and treatment plan with the patient. The patient was provided an opportunity to ask questions and all were answered. The patient agreed with the plan and demonstrated an understanding of the instructions.   The patient was advised to call back or seek an in-person evaluation if the symptoms worsen or if the condition fails to improve as anticipated.    [name], NP

## 2019-03-07 NOTE — Addendum Note (Signed)
Addended by: Debbrah Alar on: 03/07/2019 03:40 PM   Modules accepted: Orders

## 2019-03-07 NOTE — Telephone Encounter (Signed)
Was seen virtually 03-04-19

## 2019-03-09 NOTE — Addendum Note (Signed)
Addended by: Debbrah Alar on: 03/09/2019 12:26 PM   Modules accepted: Orders

## 2019-03-11 ENCOUNTER — Telehealth: Payer: Self-pay

## 2019-03-11 NOTE — Telephone Encounter (Signed)
Copied from [place] [id number]. Topic: General - Inquiry >> Mar 11, 2019  1:41 PM Mathis Bud wrote: Reason for CRM: Melissa from [facility] is requesting a PA for patient to get CT scan 5/10.  Melissa would like a call back when it is taken care of. Call back # [phone] Ext [phone]

## 2019-03-14 ENCOUNTER — Other Ambulatory Visit: Payer: Self-pay | Admitting: Hematology

## 2019-03-14 DIAGNOSIS — I2699 Other pulmonary embolism without acute cor pulmonale: Secondary | ICD-10-CM

## 2019-03-14 NOTE — Progress Notes (Signed)
Tiffany Hubbard CONSULT NOTE  Patient Care Team: Tiffany Alar, NP as PCP - General (Internal Medicine)  HEME/ONC OVERVIEW: 1. Acute unprovoked PTE of LUL -02/2019: CTA chest showed acute PTE involving a segmental branch of LUL, with possible smaller subsegmental PTE involving LLL; no DVT in the lower extremities. On Xarelto 20mg  daily   2. Incidental left adrenal nodule -Noted incidentally on CT, 2.5cm in 02/2019   TREATMENT REGIMEN:  02/2019 - present: Xarelto 20mg  daily   ASSESSMENT & PLAN:   Acute unprovoked PTE of LUL -I reviewed the patient's records in detail, including recent ER notes, lab studies, and imaging results -I also independently reviewed the radiologic images of recent CTA chest, and agreed with the findings as documented -In summary, patient presented to [facility] ER in 02/2019 for new onset left-sided pleuritic chest pain, and CTA chest showed acute PTE involving a segmental branch of LUL, with additional possible smaller subsegmental PTE involving the LLL.  There was no evidence of RV strain.  Doppler of bilateral lower extremities was negative for DVT.  Patient was given 1 dose of Xarelto and discharged home with Xarelto starter pack. -I reviewed with the patient about the plan for care for PTE. -This last episode of blood clot appeared to be unprovoked. We discussed about the pros and cons about testing for thrombophilia disorder. Her current anticoagulation therapy will interfere with some the tests and it is not possible to interpret the test results. Taking her off the anticoagulation therapy to do the tests may precipitate another thrombotic event. I do not see a reason to order additional testing to screen for thrombophilia disorder as it would not change our management. In the absence of major contraindications, the goal of anticoagulation therapy is lifelong.  -As she is tolerating Xarelto well, we will continue Xarelto 20mg  daily -Finally, I  reinforced the importance of preventive strategies such as avoiding hormonal supplement, avoiding cigarette smoking, keeping up-to-date with screening programs for early cancer detection, frequent ambulation for long distance travel and aggressive DVT prophylaxis in all surgical settings. -Should she need any interruption of the anticoagulation for elective procedures in the future, feel free to contact me regarding peri-operative management.  Left adrenal adenoma -CTA chest in 02/2019 showed an incidental 2.5 cm left adrenal nodule -I independently reviewed the radiologic images of recent CT abdomen/pelvis on 03/16/2019, and agree with findings as documented -In summary, follow-up CT abdomen/pelvis showed a benign left adrenal adenoma  -No further imaging needed  Normocytic anemia -Hgb fluctuates between low normal and normal; stable ~10 since 02/2019 -Review of previous iron studies showed low ferritin and iron, consistent with iron deficiency; given the patient's worsening menorrhagia, this is consistent with iron deficiency anemia due to chronic blood loss -Hgb 10.2 today, stable; patient reports ongoing fatigue associated with menorrhagia -I personally reviewed the peripheral blood smear, which showed borderline microcytic RBC's with scattered hypochromia, consistent with iron deficiency; there was no schistocytosis -We discussed some of the risks, benefits, and alternatives of intravenous iron infusions.  -The patient is symptomatic from anemia and the iron level is critically low; as such, oral supplement is not sufficient to replete iron storage quickly and she will need IV iron to higher levels of iron faster for adequate hematopoesis.  -Some of the side-effects to be expected including risks of infusion reactions, phlebitis, headaches, nausea and fatigue.   -The patient is willing to proceed.  We will tentatively schedule the first dose on 04/01/2019, plan for 2 doses -  Goal is to keep  ferritin level greater than 50.  Thrombocytosis -Likely reactive in the setting of iron deficiency anemia -Plts 466k today, stable -See the treatment of IDA above -We will monitor it for now   Menorrhagia -Patient reports that she has had menorrhagia for the past 7 to 8 years, but since starting Xarelto, her menstrual cycle was heavier and lasting longer -I encouraged patient to follow-up with her gynecologist for further management of menorrhagia, as her iron deficiency anemia will recur unless a source of bleeding is controlled -Given her recent PTE, estrogen-containing products should be avoided due to the risk of recurrent VTE  Proteinuria -Patient was found with elevated urine protein on UA and a random urine protein showed over 1g proteinuria -She is currently being evaluated by nephrology -Continue follow-up with nephrology  Orders Placed This Encounter  Procedures  . Save Smear (SSMR)    Standing Status:   Future    Standing Expiration Date:   03/24/2020  . Ferritin    Standing Status:   Future    Standing Expiration Date:   04/28/2020  . Iron and TIBC    Standing Status:   Future    Standing Expiration Date:   04/28/2020  . CBC with Differential (Cancer Center Only)    Standing Status:   Future    Standing Expiration Date:   04/28/2020  . CMP ([place] only)    Standing Status:   Future    Standing Expiration Date:   04/28/2020  . Save Smear (SSMR)    Standing Status:   Future    Standing Expiration Date:   03/24/2020   All questions were answered. The patient knows to call the clinic with any problems, questions or concerns.  Tiffany Men, MD 03/25/2019 9:42 AM   CHIEF COMPLAINTS/PURPOSE OF CONSULTATION:  "I have a lot of protein in my urine"  HISTORY OF PRESENTING ILLNESS:  Tiffany Hubbard 43 y.o. female is here because of newly diagnosed LUL PTE.  Tiffany Hubbard resented to [facility] ER in late 02/2019 for new onset left-sided pleuritic chest pain, sharp, lasting 30 to  40 seconds each time, and having multiple episodes per day.  There was exacerbated by exertion and resolves with rest.  She had some mild fluctuating edema in the bilateral ankles.  She denies any recent travel, surgery, personal history of cancer or VTE, or hormone use.  CTA chest showed an acute PTE involving a segmental branch of LUL as well as possible additional smaller PTE involving the LLL.  There was no evidence of RV strain.  Doppler of the lower extremities was negative for DVT.  Patient was given 1 dose of Xarelto in the ER, and discharged home with Xarelto starter pack.  In addition, she was also found with heavy proteinuria on UA, and was prescribed a course of Keflex for possible UTI.  She has been evaluated by nephrology for proteinuria and further work-up for proteinuria is ongoing.  She also reports menorrhagia for the past 7 to 8 years, lasting approximately 7 days, but since starting Xarelto, her menstrual cycle has become heavier and lasting longer (approximately 12 days).  She has not yet seen her gynecologist.  She otherwise reports tolerating Xarelto relatively well, and the denies any frank hematuria, hematochezia, melena, hematemesis, or hemoptysis.   MEDICAL HISTORY:  Past Medical History:  Diagnosis Date  . Asthma   . Bronchitis   . Pulmonary emboli ([place])     SURGICAL HISTORY: Past Surgical History:  Procedure Laterality Date  . ECTOPIC PREGNANCY SURGERY      SOCIAL HISTORY: Social History   Socioeconomic History  . Marital status: Married    Spouse name: Not on file  . Number of children: Not on file  . Years of education: Not on file  . Highest education level: Not on file  Occupational History  . 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 Financial resource strain: Not on file  . Food insecurity    Worry: Not on file    Inability: Not on file  . Transportation needs    Medical: Not on file    Non-medical: Not on  file  Tobacco Use  . Smoking status: Never Smoker  . Smokeless tobacco: Never Used  Substance and Sexual Activity  . Alcohol use: Yes    Comment: occasionally  . Drug use: No  . Sexual activity: Yes    Partners: Male    Birth control/protection: Surgical  Lifestyle  . Physical activity    Days per week: Not on file    Minutes per session: Not on file  . Stress: Not on file  Relationships  . Social Herbalist on phone: Not on file    Gets together: Not on file    Attends religious service: Not on file    Active member of club or organization: Not on file    Attends meetings of clubs or organizations: Not on file    Relationship status: Not on file  . Intimate partner violence    Fear of current or ex partner: Not on file    Emotionally abused: Not on file    Physically abused: Not on file    Forced sexual activity: Not on file  Other Topics Concern  . Not on file  Social History Narrative   No children   Step daughter- does not live with patient   Married   1 dog shitzu   Enjoys movies, books, travelling   Completed bachelors degree    FAMILY HISTORY: Family History  Problem Relation Age of Onset  . Lung cancer Mother   . Breast cancer Mother 16  . Diabetes Mother   . Heart disease Father 26  . Hypertension Father   . Diabetes Maternal Grandmother   . Stomach cancer Maternal Grandmother   . Arthritis Maternal Grandfather   . Colon cancer Maternal Aunt        2 aunts  . Colon cancer Maternal Uncle     ALLERGIES:  has No Known Allergies.  MEDICATIONS:  Current Outpatient Medications  Medication Sig Dispense Refill  . acetaminophen (TYLENOL) 500 MG tablet Take 500 mg by mouth every 6 (six) hours as needed.    Lianne Moris ALLERGY & CONGESTION 60-120 MG 12 hr tablet Take 1 tablet by mouth 2 (two) times daily. 30 tablet 5  . cephALEXin (KEFLEX) 500 MG capsule Take 1 capsule (500 mg total) by mouth 3 (three) times daily. 21 capsule 0  . furosemide  (LASIX) 20 MG tablet Take 1 tablet (20 mg total) by mouth daily. 3 tablet 0  . lidocaine (LIDODERM) 5 % Place 1 patch onto the skin daily. Remove & Discard patch within 12 hours or as directed by MD 15 patch 0  . lisinopril (ZESTRIL) 10 MG tablet Take 1 tablet by mouth daily.    Marland Kitchen OVER THE COUNTER MEDICATION Take 1 tablet by mouth every 12 (twelve)  hours. Biotrim    . rivaroxaban (XARELTO) 20 MG TABS tablet Take 1 tablet (20 mg total) by mouth daily with supper. (Patient taking differently: Take 20 mg by mouth daily. ) 30 tablet 1  . triamcinolone (KENALOG) 0.025 % cream Apply 1 application topically 2 (two) times daily as needed (eczema). 30 g 1   No current facility-administered medications for this visit.     REVIEW OF SYSTEMS:   Constitutional: ( - ) fevers, ( - )  chills , ( - ) night sweats Eyes: ( - ) blurriness of vision, ( - ) double vision, ( - ) watery eyes Ears, nose, mouth, throat, and face: ( - ) mucositis, ( - ) sore throat Respiratory: ( - ) cough, ( - ) dyspnea, ( - ) wheezes Cardiovascular: ( - ) palpitation, ( - ) chest discomfort, ( + ) lower extremity swelling Gastrointestinal:  ( - ) nausea, ( - ) heartburn, ( - ) change in bowel habits Skin: ( - ) abnormal skin rashes Lymphatics: ( - ) new lymphadenopathy, ( - ) easy bruising Neurological: ( - ) numbness, ( - ) tingling, ( - ) new weaknesses Behavioral/Psych: ( - ) mood change, ( - ) new changes  All other systems were reviewed with the patient and are negative.  PHYSICAL EXAMINATION: ECOG PERFORMANCE STATUS: 1 - Symptomatic but completely ambulatory  Vitals:   03/25/19 0910  BP: 98/68  Pulse: 83  Resp: 18  Temp: 98.8 F (37.1 C)  SpO2: 100%   Filed Weights   03/25/19 0910  Weight: 274 lb 6.4 oz (124.5 kg)    GENERAL: alert, no distress and comfortable SKIN: skin color, texture, turgor are normal, no rashes or significant lesions EYES: conjunctiva are pink and non-injected, sclera clear OROPHARYNX: no  exudate, no erythema; lips, buccal mucosa, and tongue normal  NECK: supple, non-tender LUNGS: clear to auscultation with normal breathing effort HEART: regular rate & rhythm, no murmurs, 2+ bilateral lower extremity edema ABDOMEN: soft, non-tender, non-distended, normal bowel sounds Musculoskeletal: no cyanosis of digits and no clubbing  PSYCH: alert & oriented x 3, fluent speech NEURO: no focal motor/sensory deficits  LABORATORY DATA:  I have reviewed the data as listed Lab Results  Component Value Date   WBC 5.4 03/25/2019   HGB 10.2 (L) 03/25/2019   HCT 32.4 (L) 03/25/2019   MCV 81.2 03/25/2019   PLT 466 (H) 03/25/2019   Lab Results  Component Value Date   NA 136 03/25/2019   K 4.0 03/25/2019   CL 109 03/25/2019   CO2 24 03/25/2019    RADIOGRAPHIC STUDIES: I have personally reviewed the radiological images as listed and agreed with the findings in the report. Dg Chest 2 View  Result Date: 03/19/2019 CLINICAL DATA:  Arm and leg swelling for 1 week. Some intermittent chest pain. No shortness of breath. Patient taking Xarelto. Recently diagnosed with PE. EXAM: CHEST - 2 VIEW COMPARISON:  03/02/2019 FINDINGS: Cardiac silhouette is normal in size. No mediastinal or hilar masses. No evidence of adenopathy. Clear lungs.  No pleural effusion or pneumothorax. Skeletal structures are unremarkable. IMPRESSION: No active cardiopulmonary disease. Electronically Signed   By: Lajean Manes M.D.   On: 03/19/2019 21:39   Ct Angio Chest Pe W/cm &/or Wo Cm  Result Date: 03/02/2019 CLINICAL DATA:  Shortness of breath.  Sharp left-sided chest pain. EXAM: CT ANGIOGRAPHY CHEST WITH CONTRAST TECHNIQUE: Multidetector CT imaging of the chest was performed using the standard protocol during bolus administration  of intravenous contrast. Multiplanar CT image reconstructions and MIPs were obtained to evaluate the vascular anatomy. CONTRAST:  153mL OMNIPAQUE IOHEXOL 350 MG/ML SOLN COMPARISON:  None. FINDINGS:  Cardiovascular: There is an acute pulmonary embolus involving a segmental branch of the left upper lobe (axial series 11, image 156). There may be additional smaller subsegmental pulmonary emboli involving the left lower lobe. There is mild cardiac enlargement. No CT evidence of heart strain. Mediastinum/Nodes: No enlarged mediastinal, hilar, or axillary lymph nodes. Thyroid gland, trachea, and esophagus demonstrate no significant findings. Lungs/Pleura: There is a small right-sided pleural effusion. The lung volumes are somewhat low. There is a slight mosaic appearance of all of the lobe. Upper Abdomen: There is a complex 2.5 cm left adrenal nodule. The remaining portions of the upper abdomen are unremarkable. Musculoskeletal: No chest wall abnormality. No acute or significant osseous findings. Review of the MIP images confirms the above findings. IMPRESSION: 1. Acute segmental pulmonary embolus involving the left upper lobe. No evidence of heart strain. 2. The lungs are clear aside from a small right-sided pleural effusion. 3. Mildly enlarged heart. 4. Indeterminate 2.5 cm left adrenal nodule. Follow-up with a nonemergent outpatient adrenal mass protocol MRI or CT is recommended. These results were called by telephone at the time of interpretation on 03/02/2019 at 7:47 pm to [facility] , who verbally acknowledged these results. Electronically Signed   By: Constance Holster M.D.   On: 03/02/2019 19:55   Ct Abdomen W Wo Contrast  Result Date: 03/16/2019 CLINICAL DATA:  Indeterminate left adrenal mass on recent chest CTA. EXAM: CT ABDOMEN WITHOUT AND WITH CONTRAST TECHNIQUE: Multidetector CT imaging of the abdomen was performed following the standard protocol before and following the bolus administration of intravenous contrast. CONTRAST:  136mL OMNIPAQUE IOHEXOL 300 MG/ML  SOLN COMPARISON:  Chest CTA on 03/02/2019 FINDINGS: Lower chest: No acute findings. Hepatobiliary: No hepatic masses  identified. Gallbladder is unremarkable. Pancreas:  No mass or inflammatory changes. Spleen:  Within normal limits in size and appearance. Adrenals/Urinary Tract: 1.9 cm left adrenal mass. This shows contrast washout consistent with a benign adrenal adenoma. The right adrenal gland is normal appearance. No evidence of renal masses or hydronephrosis. Bilateral perinephric soft tissue stranding is seen which is nonspecific, and may be due to pyelonephritis or medical renal disease. Stomach/Bowel: Visualized portion unremarkable. Vascular/Lymphatic: No pathologically enlarged lymph nodes identified. No abdominal aortic aneurysm. Other:  None. Musculoskeletal:  No suspicious bone lesions identified. IMPRESSION: 1.9 cm benign left adrenal adenoma. Nonspecific bilateral perinephric soft tissue stranding, which could be due to pyelonephritis or medical renal disease. Recommend correlation with urinalysis and BUN/creatinine levels. Electronically Signed   By: Earle Gell M.D.   On: 03/16/2019 16:32   Dg Chest Portable 1 View  Result Date: 03/02/2019 CLINICAL DATA:  Chest pain and dyspnea EXAM: PORTABLE CHEST 1 VIEW COMPARISON:  12/31/2018 FINDINGS: The heart size is stable. There is mild volume overload without overt pulmonary edema. There may be a trace right-sided pleural effusion. There is no pneumothorax. No acute osseous abnormality. IMPRESSION: Mild volume overload with a trace right-sided pleural effusion. Electronically Signed   By: Constance Holster M.D.   On: 03/02/2019 16:01   Vas [place] Lower Extremity Venous (dvt) (only Mc & Wl 7a-7p)  Result Date: 03/03/2019  Lower Venous Study Indications: Edema, SOB, and Chest pain.  Risk Factors: Obesity. Performing Technologist: Toma Copier RVS  Examination Guidelines: A complete evaluation includes B-mode imaging, spectral Doppler, color Doppler, and power Doppler as  needed of all accessible portions of each vessel. Bilateral testing is considered an integral  part of a complete examination. Limited examinations for reoccurring indications may be performed as noted.  +---------+---------------+---------+-----------+----------+-------+ RIGHT    CompressibilityPhasicitySpontaneityPropertiesSummary +---------+---------------+---------+-----------+----------+-------+ CFV      Full           Yes      Yes                          +---------+---------------+---------+-----------+----------+-------+ SFJ      Full                                                 +---------+---------------+---------+-----------+----------+-------+ FV Prox  Full           Yes      Yes                          +---------+---------------+---------+-----------+----------+-------+ FV Mid   Full                                                 +---------+---------------+---------+-----------+----------+-------+ FV DistalFull           Yes      Yes                          +---------+---------------+---------+-----------+----------+-------+ PFV      Full           Yes      Yes                          +---------+---------------+---------+-----------+----------+-------+ POP      Full           Yes      Yes                          +---------+---------------+---------+-----------+----------+-------+ PTV      Full                                                 +---------+---------------+---------+-----------+----------+-------+ PERO     Full                                                 +---------+---------------+---------+-----------+----------+-------+   +---------+---------------+---------+-----------+----------+-------+ LEFT     CompressibilityPhasicitySpontaneityPropertiesSummary +---------+---------------+---------+-----------+----------+-------+ CFV      Full           Yes      Yes                          +---------+---------------+---------+-----------+----------+-------+ SFJ      Full                                                  +---------+---------------+---------+-----------+----------+-------+  FV Prox  Full           Yes      Yes                          +---------+---------------+---------+-----------+----------+-------+ FV Mid   Full                                                 +---------+---------------+---------+-----------+----------+-------+ FV DistalFull           Yes      Yes                          +---------+---------------+---------+-----------+----------+-------+ PFV      Full           Yes      Yes                          +---------+---------------+---------+-----------+----------+-------+ POP      Full           Yes      Yes                          +---------+---------------+---------+-----------+----------+-------+ PTV      Full                                                 +---------+---------------+---------+-----------+----------+-------+ PERO     Full                                                 +---------+---------------+---------+-----------+----------+-------+     Summary: Right: There is no evidence of deep vein thrombosis in the lower extremity. No cystic structure found in the popliteal fossa. Left: There is no evidence of deep vein thrombosis in the lower extremity. No cystic structure found in the popliteal fossa.  *See table(s) above for measurements and observations. Electronically signed by Servando Snare MD on 03/03/2019 at 12:42:27 PM.    Final     PATHOLOGY: I personally reviewed the patient's peripheral blood smear today.  The red blood cells were borderline microcytic with some central hypochromia, consistent with iron deficiency.  There was no schistocytosis.  The white blood cells were of normal morphology. There were no peripheral circulating blasts. There was increased platelet number, some of which appeared immature.  I verified that there were no platelet clumping.

## 2019-03-16 ENCOUNTER — Ambulatory Visit (HOSPITAL_BASED_OUTPATIENT_CLINIC_OR_DEPARTMENT_OTHER)
Admission: RE | Admit: 2019-03-16 | Discharge: 2019-03-16 | Disposition: A | Discharge status: Home / Self Care | Payer: BC Managed Care – PPO | Source: Ambulatory Visit | Attending: Family | Admitting: Family

## 2019-03-16 ENCOUNTER — Encounter (HOSPITAL_BASED_OUTPATIENT_CLINIC_OR_DEPARTMENT_OTHER): Payer: Self-pay

## 2019-03-16 ENCOUNTER — Other Ambulatory Visit: Payer: Self-pay

## 2019-03-16 DIAGNOSIS — E278 Other specified disorders of adrenal gland: Secondary | ICD-10-CM

## 2019-03-16 DIAGNOSIS — D3502 Benign neoplasm of left adrenal gland: Secondary | ICD-10-CM | POA: Diagnosis not present

## 2019-03-16 IMAGING — CT CT ABDOMEN WITHOUT AND WITH CONTRAST
3 of 10 series · 12 of 46 positions shown, 18 images · IV contrast (APPLIED)
Comparison: Chest CTA on [DATE]

CLINICAL DATA: Indeterminate left adrenal mass on recent chest CTA.

EXAM:
CT ABDOMEN WITHOUT AND WITH CONTRAST
TECHNIQUE: Multidetector CT imaging of the abdomen was performed following the
standard protocol before and following the bolus administration of
intravenous contrast.
CONTRAST:  100mL OMNIPAQUE IOHEXOL 300 MG/ML  SOLN

[Series 2: axial st · axial · 0.88mm/px · z∈[-253,-7]mm · 8 of 106 slices shown, 13 images (1 of 2)]
[im 12/106  soft-tissue]
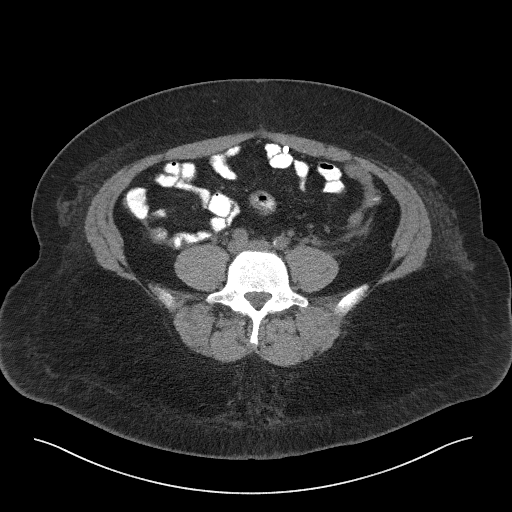
[im 12/106  bone]
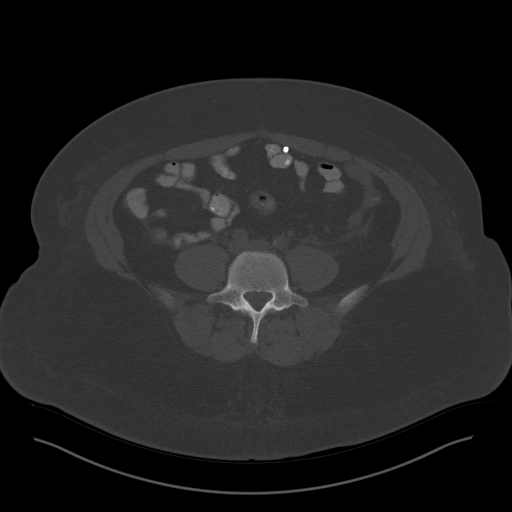
[im 24/106  soft-tissue]
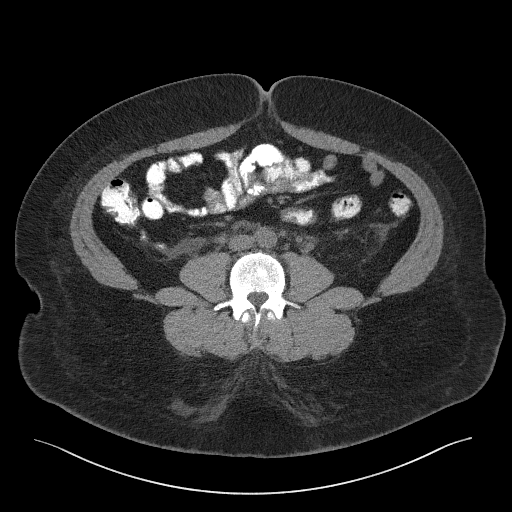
[im 36/106  soft-tissue]
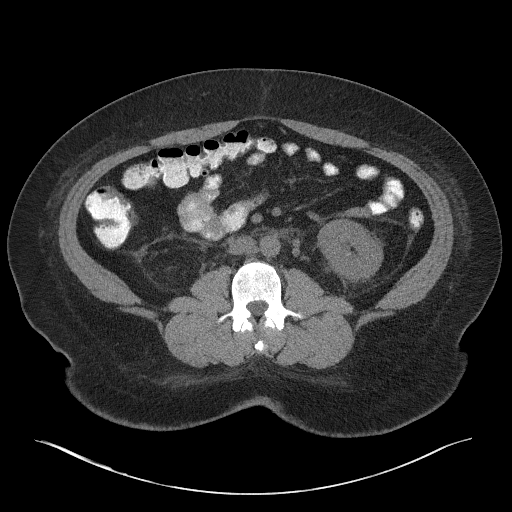
[im 47/106  soft-tissue]
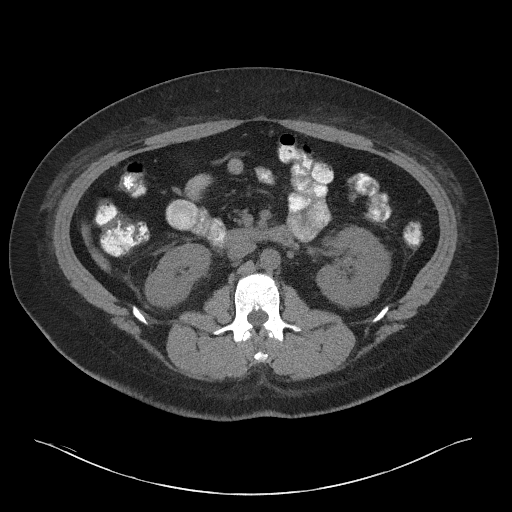
[im 59/106  soft-tissue]
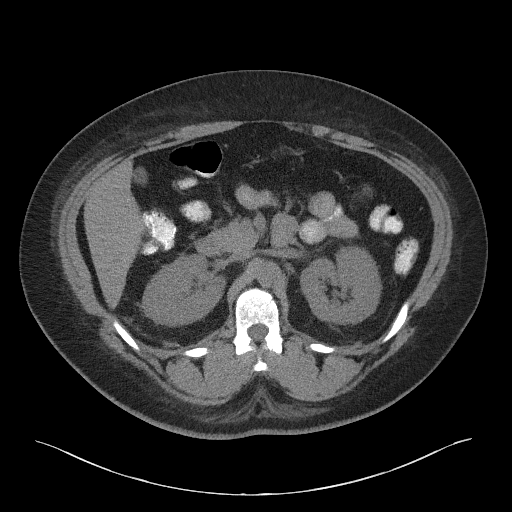
[im 59/106  lung]
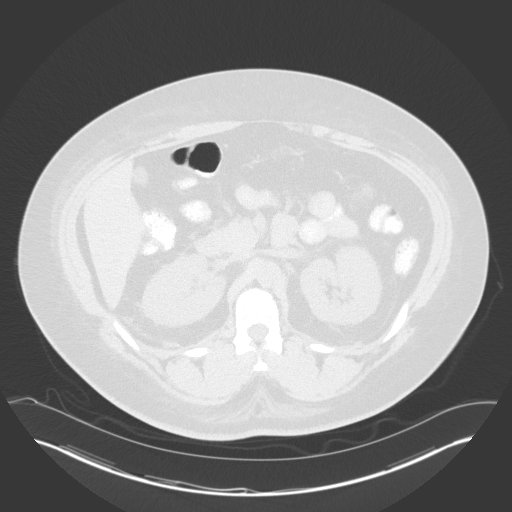
[im 71/106  soft-tissue]
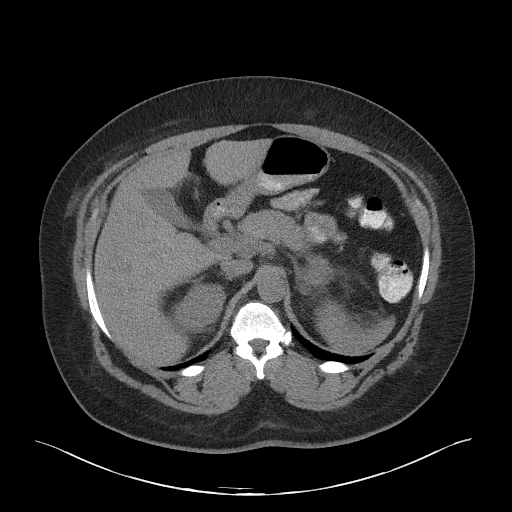
[im 71/106  lung]
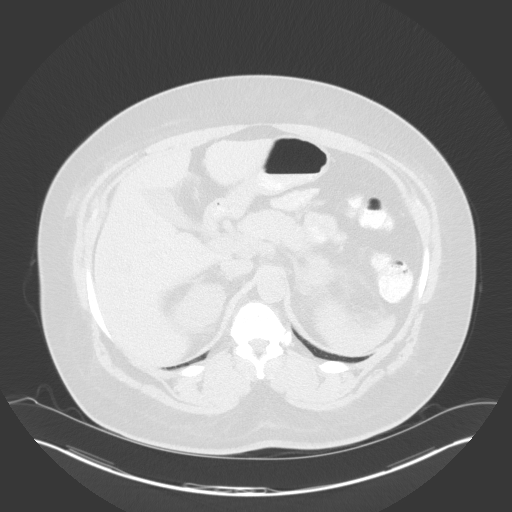
[im 82/106  soft-tissue]
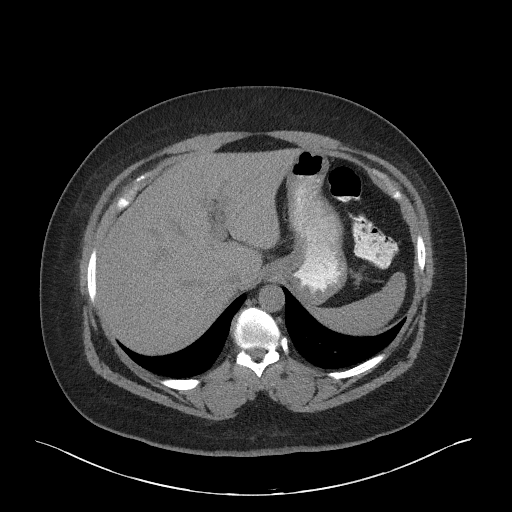
[im 82/106  lung]
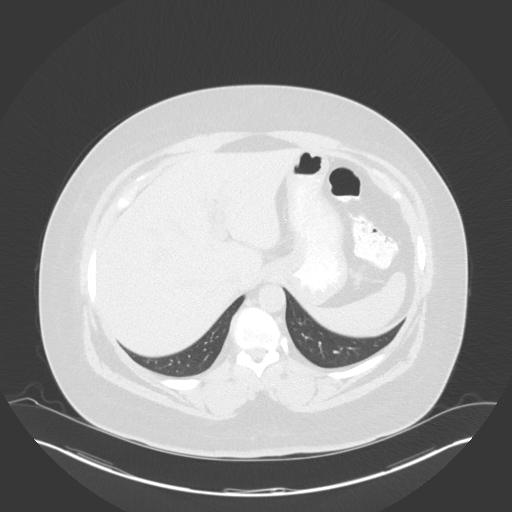
[im 94/106  soft-tissue]
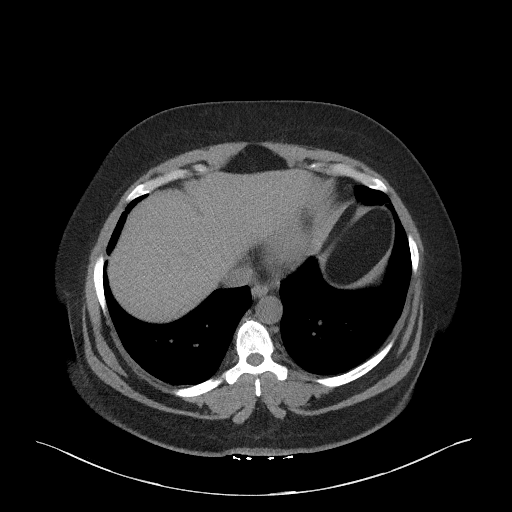
[im 94/106  lung]
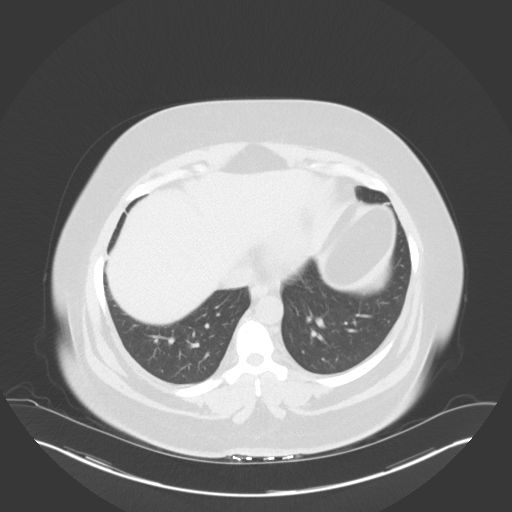

[Series 5: coronal wo · coronal · 0.62mm/px · 2 of 179 slices shown, 3 images]
[im 60/179  soft-tissue]
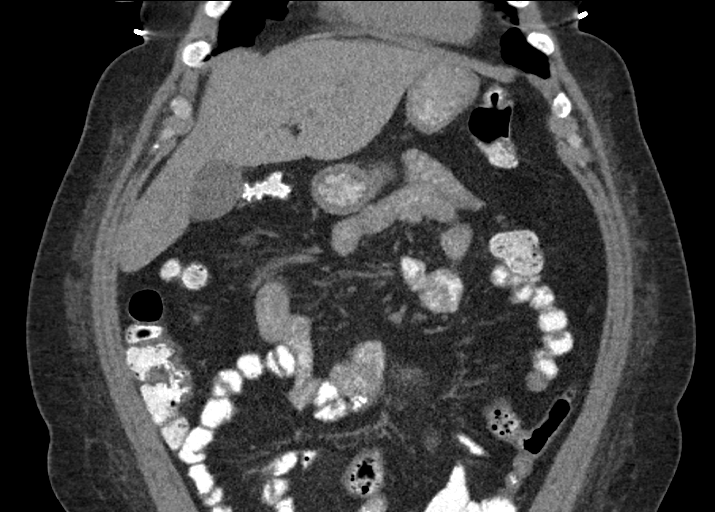
[im 60/179  bone]
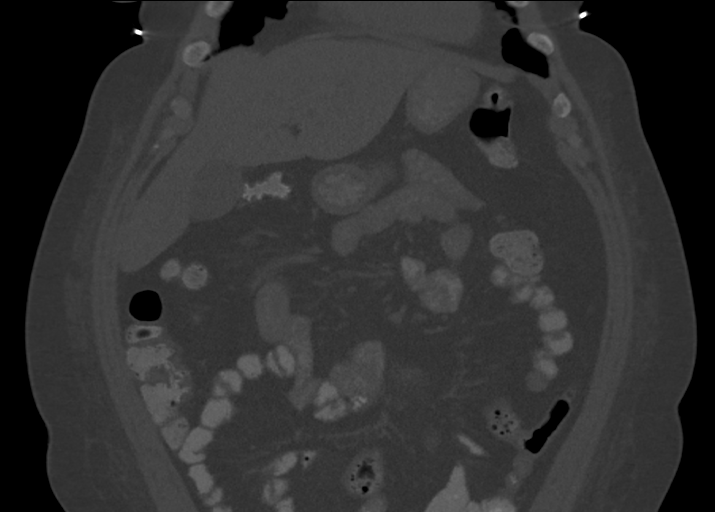
[im 119/179  soft-tissue]
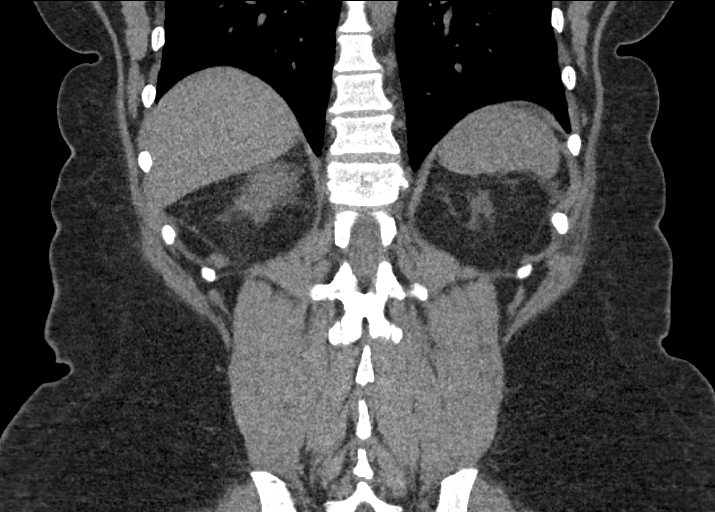

[Series 7: axial st · axial · 0.88mm/px · z∈[-247,-208]mm · 2 of 106 slices shown (2 of 2)]
[im 14/106  soft-tissue]
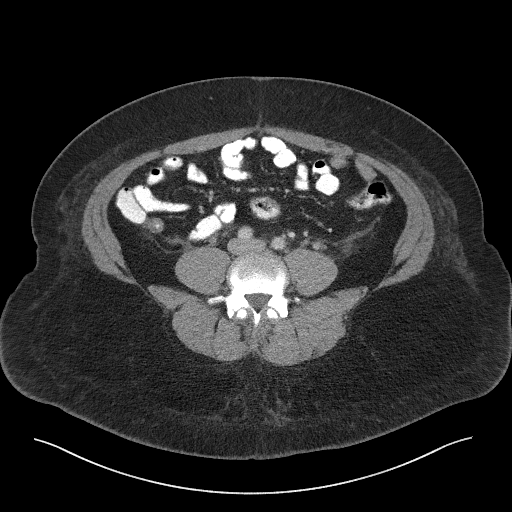
[im 27/106  soft-tissue]
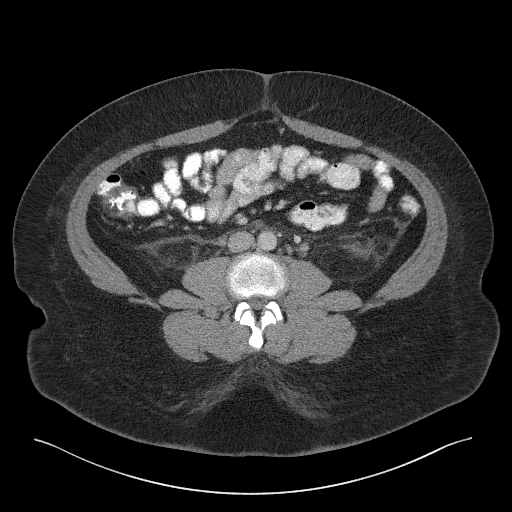

[12 of 46 positions shown; findings below may reference images not displayed]

FINDINGS: Lower chest: No acute findings.

Hepatobiliary: No hepatic masses identified. Gallbladder is
unremarkable.

Pancreas:  No mass or inflammatory changes.

Spleen:  Within normal limits in size and appearance.

Adrenals/Urinary Tract: 1.9 cm left adrenal mass. This shows
contrast washout consistent with a benign adrenal adenoma. The right
adrenal gland is normal appearance. No evidence of renal masses or
hydronephrosis. Bilateral perinephric soft tissue stranding is seen
which is nonspecific, and may be due to pyelonephritis or medical
renal disease.

Stomach/Bowel: Visualized portion unremarkable.

Vascular/Lymphatic: No pathologically enlarged lymph nodes
identified. No abdominal aortic aneurysm.

Other:  None.

Musculoskeletal:  No suspicious bone lesions identified.
IMPRESSION: 1.9 cm benign left adrenal adenoma.

Nonspecific bilateral perinephric soft tissue stranding, which could
be due to pyelonephritis or medical renal disease. Recommend
correlation with urinalysis and BUN/creatinine levels.

## 2019-03-16 MED ORDER — IOHEXOL 300 MG/ML  SOLN
100.0000 mL | Freq: Once | INTRAMUSCULAR | Status: AC | PRN
  Administered 2019-03-16: 100 mL via INTRAVENOUS

## 2019-03-17 ENCOUNTER — Telehealth: Payer: Self-pay | Admitting: Family

## 2019-03-17 DIAGNOSIS — R809 Proteinuria, unspecified: Secondary | ICD-10-CM

## 2019-03-17 MED ORDER — RIVAROXABAN 20 MG PO TABS: 20.0000 mg | ORAL_TABLET | Freq: Every day | ORAL | 1 refills | Status: DC

## 2019-03-17 NOTE — Telephone Encounter (Signed)
Attempted to contact pt- no answer, mailbox full.   Please contact pt to schedule a lab visit.  You can let her know that I placed details in mychart message if she has questions.

## 2019-03-18 MED ORDER — FUROSEMIDE 20 MG PO TABS: 20.0000 mg | ORAL_TABLET | Freq: Every day | ORAL | 0 refills | Status: DC | PRN

## 2019-03-18 NOTE — Addendum Note (Signed)
Addended by: Debbrah Alar on: 03/18/2019 09:10 AM   Modules accepted: Orders

## 2019-03-19 ENCOUNTER — Other Ambulatory Visit: Payer: Self-pay

## 2019-03-19 ENCOUNTER — Emergency Department (HOSPITAL_COMMUNITY): Payer: BC Managed Care – PPO

## 2019-03-19 ENCOUNTER — Encounter (HOSPITAL_COMMUNITY): Payer: Self-pay | Admitting: *Deleted

## 2019-03-19 ENCOUNTER — Emergency Department (HOSPITAL_COMMUNITY)
Admission: EM | Admit: 2019-03-19 | Discharge: 2019-03-19 | Disposition: A | Discharge status: Home / Self Care | Payer: BC Managed Care – PPO | Attending: Emergency Medicine | Admitting: Emergency Medicine

## 2019-03-19 DIAGNOSIS — N3000 Acute cystitis without hematuria: Secondary | ICD-10-CM | POA: Diagnosis not present

## 2019-03-19 DIAGNOSIS — Z79899 Other long term (current) drug therapy: Secondary | ICD-10-CM | POA: Insufficient documentation

## 2019-03-19 DIAGNOSIS — M7989 Other specified soft tissue disorders: Secondary | ICD-10-CM | POA: Diagnosis not present

## 2019-03-19 DIAGNOSIS — Z7901 Long term (current) use of anticoagulants: Secondary | ICD-10-CM | POA: Insufficient documentation

## 2019-03-19 DIAGNOSIS — R609 Edema, unspecified: Secondary | ICD-10-CM | POA: Diagnosis not present

## 2019-03-19 DIAGNOSIS — Z8709 Personal history of other diseases of the respiratory system: Secondary | ICD-10-CM | POA: Insufficient documentation

## 2019-03-19 DIAGNOSIS — R079 Chest pain, unspecified: Secondary | ICD-10-CM | POA: Diagnosis not present

## 2019-03-19 DIAGNOSIS — Z86711 Personal history of pulmonary embolism: Secondary | ICD-10-CM | POA: Insufficient documentation

## 2019-03-19 DIAGNOSIS — R0602 Shortness of breath: Secondary | ICD-10-CM | POA: Diagnosis not present

## 2019-03-19 HISTORY — DX: Other pulmonary embolism without acute cor pulmonale: I26.99

## 2019-03-19 LAB — URINALYSIS, ROUTINE W REFLEX MICROSCOPIC
Bilirubin Urine: NEGATIVE
Glucose, UA: NEGATIVE mg/dL
Ketones, ur: NEGATIVE mg/dL
Leukocytes,Ua: NEGATIVE
Nitrite: NEGATIVE
Protein, ur: >=300 mg/dL — AB
Specific Gravity, Urine: 1.035 — ABNORMAL HIGH (ref 1.005–1.030)
pH: 5 (ref 5.0–8.0)

## 2019-03-19 LAB — TROPONIN I: Troponin I: <0.03 ng/mL (ref <0.03)

## 2019-03-19 LAB — CBC
HCT: 32.2 % — ABNORMAL LOW (ref 36.0–46.0)
Hemoglobin: 9.9 g/dL — ABNORMAL LOW (ref 12.0–15.0)
MCH: 25.4 pg — ABNORMAL LOW (ref 26.0–34.0)
MCHC: 30.7 g/dL (ref 30.0–36.0)
MCV: 82.6 fL (ref 80.0–100.0)
Platelets: 493 10*3/uL — ABNORMAL HIGH (ref 150–400)
RBC: 3.9 MIL/uL (ref 3.87–5.11)
RDW: 18.6 % — ABNORMAL HIGH (ref 11.5–15.5)
WBC: 6.3 10*3/uL (ref 4.0–10.5)
nRBC: 0 % (ref 0.0–0.2)

## 2019-03-19 LAB — BASIC METABOLIC PANEL
Anion gap: 6 (ref 5–15)
BUN: 24 mg/dL — ABNORMAL HIGH (ref 6–20)
CO2: 21 mmol/L — ABNORMAL LOW (ref 22–32)
Calcium: 7.7 mg/dL — ABNORMAL LOW (ref 8.9–10.3)
Chloride: 112 mmol/L — ABNORMAL HIGH (ref 98–111)
Creatinine, Ser: 0.98 mg/dL (ref 0.44–1.00)
GFR calc Af Amer: >60 mL/min (ref >60)
GFR calc non Af Amer: >60 mL/min (ref >60)
Glucose, Bld: 92 mg/dL (ref 70–99)
Potassium: 4.2 mmol/L (ref 3.5–5.1)
Sodium: 139 mmol/L (ref 135–145)

## 2019-03-19 LAB — I-STAT BETA HCG BLOOD, ED (MC, WL, AP ONLY): I-stat hCG, quantitative: <5 m[IU]/mL (ref <5)

## 2019-03-19 IMAGING — CR CHEST - 2 VIEW
2 series · 2 of 2 positions shown · non-contrast
Comparison: [DATE]

CLINICAL DATA: Arm and leg swelling for 1 week. Some intermittent
chest pain. No shortness of breath. Patient taking Xarelto. Recently
diagnosed with PE.

EXAM:
CHEST - 2 VIEW

[chest pa]
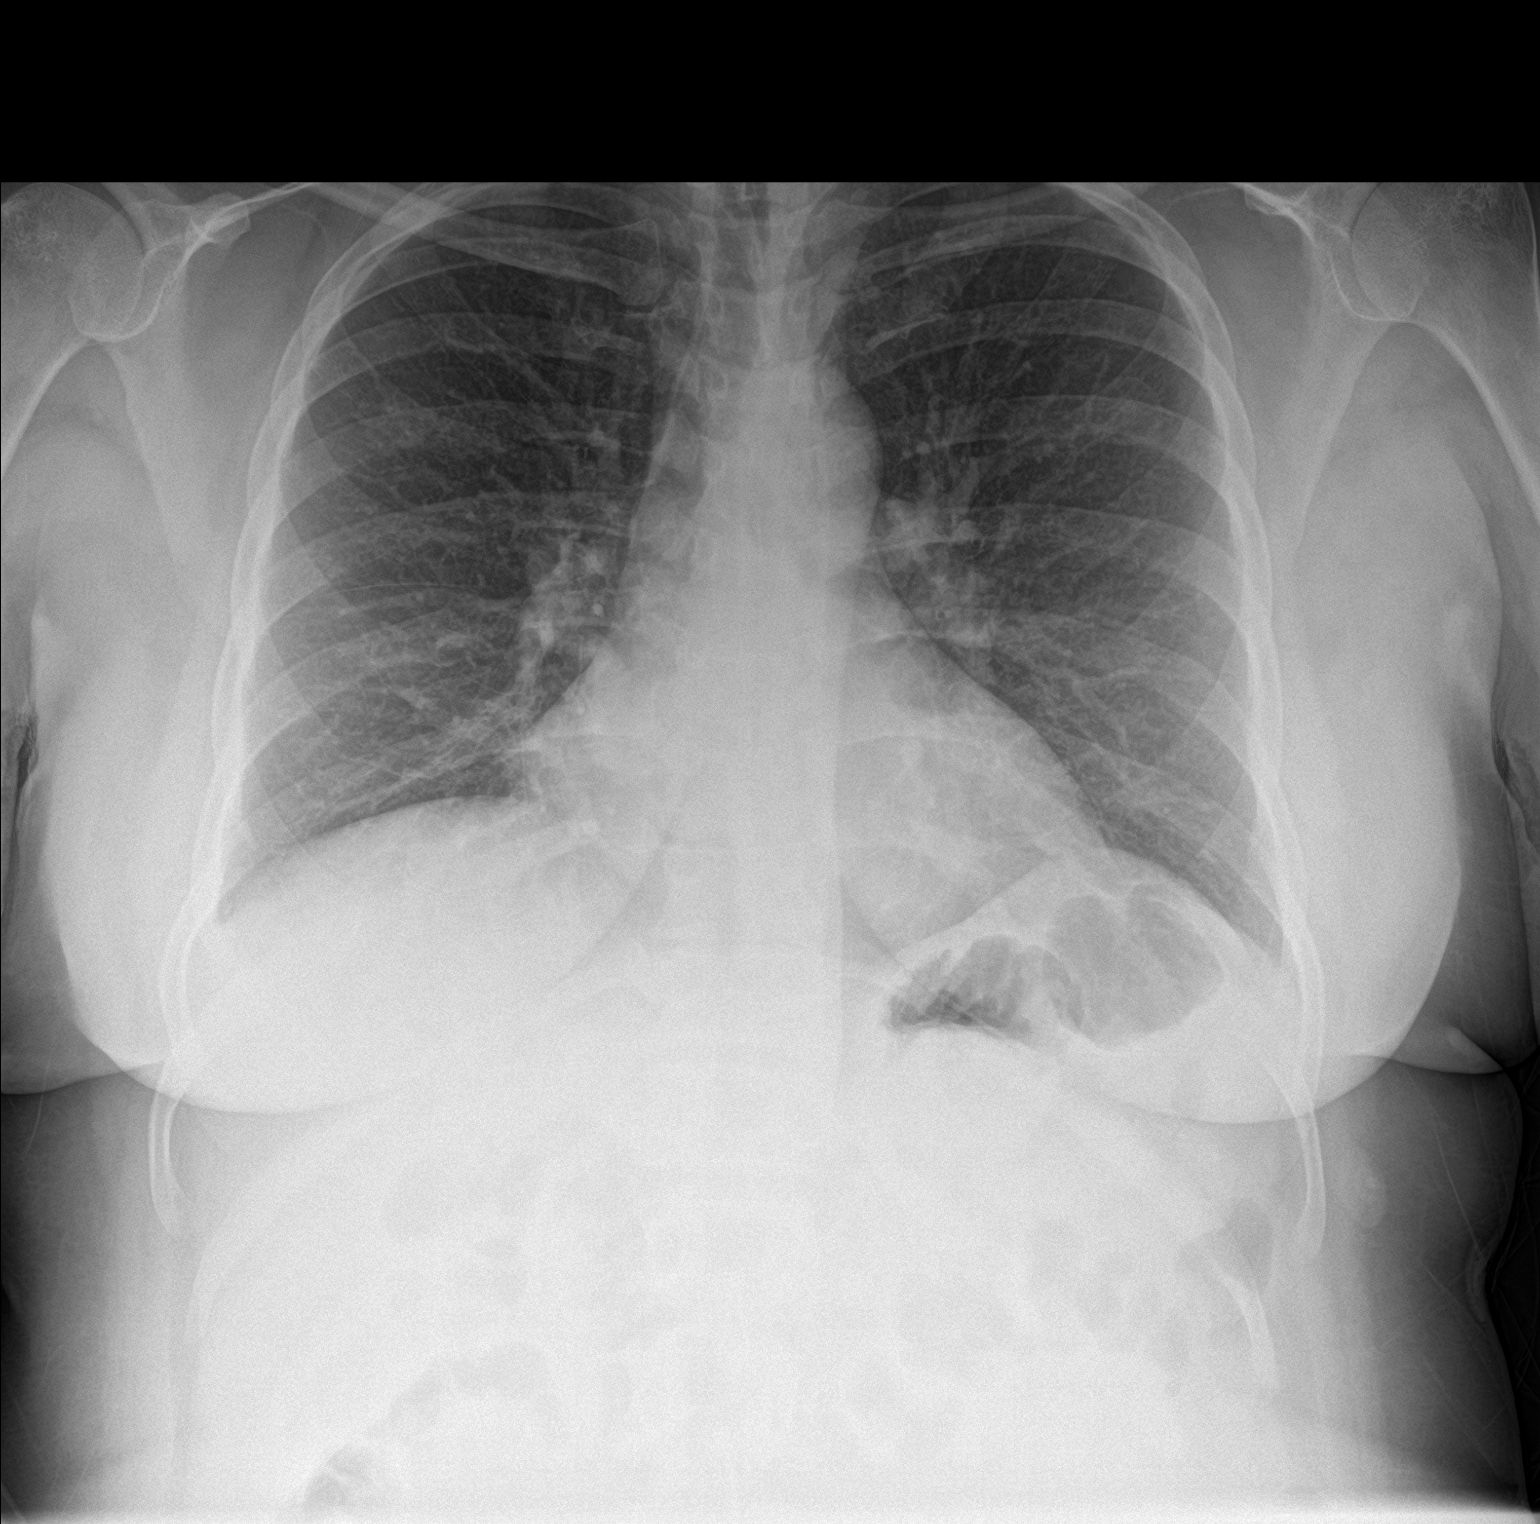

[chest lat]
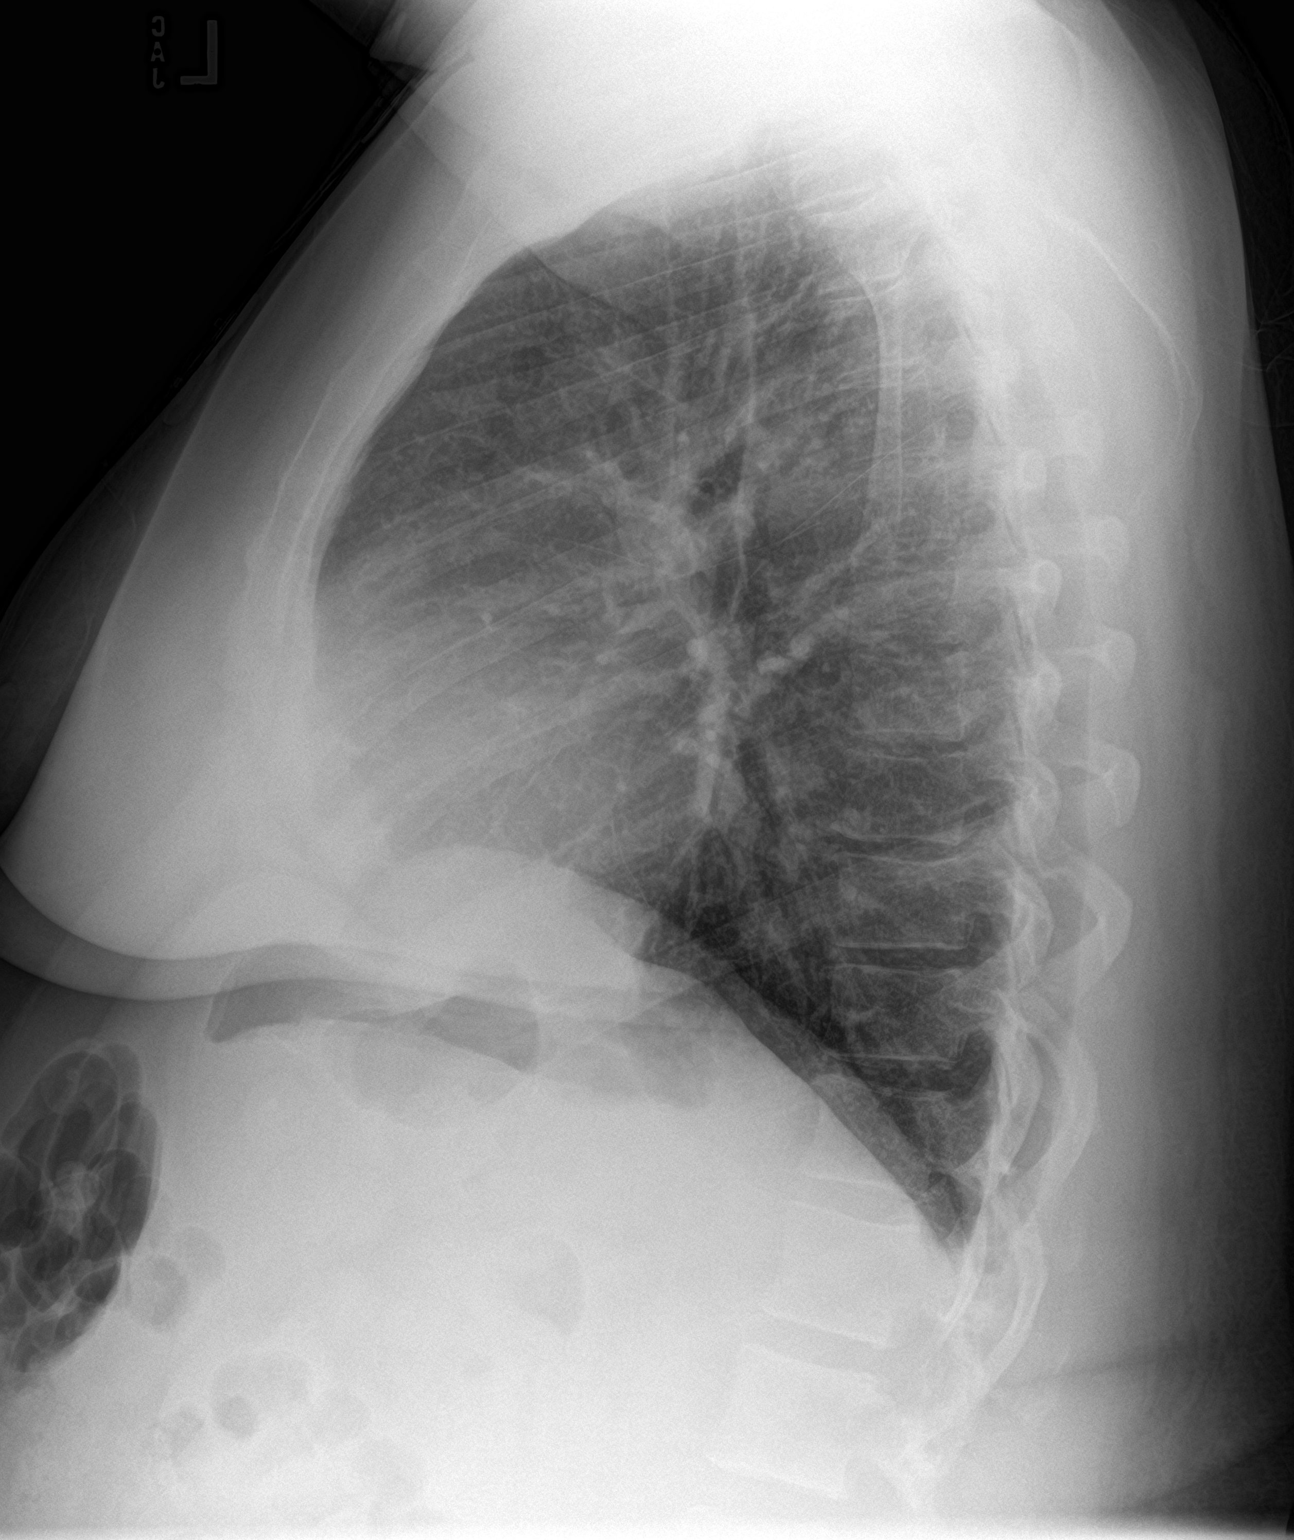

[2 of 2 positions shown; findings below may reference images not displayed]

FINDINGS: Cardiac silhouette is normal in size. No mediastinal or hilar
masses. No evidence of adenopathy.

Clear lungs.  No pleural effusion or pneumothorax.

Skeletal structures are unremarkable.
IMPRESSION: No active cardiopulmonary disease.

## 2019-03-19 MED ORDER — CEPHALEXIN 250 MG PO CAPS
500.0000 mg | ORAL_CAPSULE | Freq: Once | ORAL | Status: AC
  Administered 2019-03-19: 500 mg via ORAL
  Filled 2019-03-19: qty 2

## 2019-03-19 MED ORDER — FUROSEMIDE 10 MG/ML IJ SOLN
40.0000 mg | Freq: Once | INTRAMUSCULAR | Status: AC
  Administered 2019-03-19: 40 mg via INTRAVENOUS
  Filled 2019-03-19: qty 4

## 2019-03-19 MED ORDER — SODIUM CHLORIDE 0.9% FLUSH: 3.0000 mL | Freq: Once | INTRAVENOUS | Status: DC

## 2019-03-19 MED ORDER — DOXYCYCLINE HYCLATE 100 MG PO TABS: 100.0000 mg | ORAL_TABLET | Freq: Once | ORAL | Status: DC

## 2019-03-19 MED ORDER — CEPHALEXIN 500 MG PO CAPS: 500.0000 mg | ORAL_CAPSULE | Freq: Three times a day (TID) | ORAL | 0 refills | Status: DC

## 2019-03-19 MED ORDER — FUROSEMIDE 20 MG PO TABS: 20.0000 mg | ORAL_TABLET | Freq: Every day | ORAL | 0 refills | Status: DC

## 2019-03-19 NOTE — Discharge Instructions (Addendum)
Take lasix 20 mg daily x 3 days   Take keflex three times daily for a week for UTI  See your doctor next week. See your hematologist    Return to ER if you have worse shortness of breath, chest pain, flank pain, fever, vomiting

## 2019-03-19 NOTE — ED Provider Notes (Signed)
[facility] EMERGENCY DEPARTMENT Provider Note   CSN: [id number] Arrival date & time: 03/19/19  2040    History   Chief Complaint Chief Complaint  Patient presents with  . Leg Swelling    HPI Tiffany Hubbard is a 43 y.o. female hx of asthma, recent PE on xarelto, here with leg swelling, possible pyelonephritis on CT.  Patient states that she was recently seen in the ED and was diagnosed with PE.  Patient had a follow-up CT scan that was done 3 days ago that showed slightly abnormal kidneys bilaterally and suspicious for pyelonephritis.  Patient has no urinary symptoms and just finished a course of antibiotics for kidney infection.  She does have bilateral leg swelling however.  Denies fevers or chills.      The history is provided by the patient.    Past Medical History:  Diagnosis Date  . Asthma   . Bronchitis   . Pulmonary emboli [facility])     Patient Active Problem List   Diagnosis Date Noted  . Pulmonary embolism on left ([place]) 03/02/2019  . Eczema 10/31/2013  . Family history of breast cancer in mother 10/22/2012  . Family history of colon cancer 10/22/2012  . Screening for malignant neoplasm of cervix 10/22/2012  . Thyromegaly 06/10/2012  . Lymphadenopathy, submandibular 06/10/2012  . Physical exam, annual 06/10/2012    Past Surgical History:  Procedure Laterality Date  . ECTOPIC PREGNANCY SURGERY       OB History   No obstetric history on file.      Home Medications    Prior to Admission medications   Medication Sig Start Date End Date Taking? Authorizing Provider  acetaminophen (TYLENOL) 500 MG tablet Take 500 mg by mouth every 6 (six) hours as needed.   Yes [provider]  ALLEGRA-D ALLERGY & CONGESTION 60-120 MG 12 hr tablet Take 1 tablet by mouth 2 (two) times daily. 02/17/18  Yes Debbrah Alar, NP  rivaroxaban (XARELTO) 20 MG TABS tablet Take 1 tablet (20 mg total) by mouth daily with supper. Patient taking  differently: Take 20 mg by mouth daily.  03/17/19  Yes Debbrah Alar, NP  triamcinolone (KENALOG) 0.025 % cream Apply 1 application topically 2 (two) times daily as needed (eczema).   Yes [provider]  cephALEXin (KEFLEX) 500 MG capsule Take 1 capsule (500 mg total) by mouth 3 (three) times daily. 03/19/19   Drenda Freeze, MD  furosemide (LASIX) 20 MG tablet Take 1 tablet (20 mg total) by mouth daily. 03/19/19   Drenda Freeze, MD  lidocaine (LIDODERM) 5 % Place 1 patch onto the skin daily. Remove & Discard patch within 12 hours or as directed by MD Patient not taking: Reported on 03/19/2019 03/02/19   Petrucelli, Samantha R, PA-C  OVER THE COUNTER MEDICATION Take 1 tablet by mouth every 12 (twelve) hours. Biotrim    [provider]    Family History Family History  Problem Relation Age of Onset  . Lung cancer Mother   . Breast cancer Mother 31  . Diabetes Mother   . Heart disease Father 39  . Hypertension Father   . Diabetes Maternal Grandmother   . Stomach cancer Maternal Grandmother   . Arthritis Maternal Grandfather   . Colon cancer Maternal Aunt        2 aunts  . Colon cancer Maternal Uncle     Social History Social History   Tobacco Use  . Smoking status: Never Smoker  . Smokeless  tobacco: Never Used  Substance Use Topics  . Alcohol use: Yes    Comment: occasionally  . Drug use: No     Allergies   Patient has no known allergies.   Review of Systems Review of Systems  Cardiovascular: Positive for leg swelling.  All other systems reviewed and are negative.    Physical Exam Updated Vital Signs BP (!) 145/82   Pulse 86   Temp 98.3 F (36.8 C) (Oral)   Resp 16   Ht 5\' 6"  (1.676 m)   Wt 102.5 kg   LMP 03/08/2019   SpO2 100%   BMI 36.48 kg/m   Physical Exam Vitals signs and nursing note reviewed.  HENT:     Head: Normocephalic.     Right Ear: Tympanic membrane normal.     Left Ear: Tympanic membrane normal.     Nose:  Nose normal.     Mouth/Throat:     Mouth: Mucous membranes are moist.  Eyes:     Extraocular Movements: Extraocular movements intact.     Pupils: Pupils are equal, round, and reactive to light.  Neck:     Musculoskeletal: Normal range of motion.  Cardiovascular:     Rate and Rhythm: Normal rate and regular rhythm.     Heart sounds: Normal heart sounds.  Pulmonary:     Effort: Pulmonary effort is normal.     Breath sounds: Normal breath sounds.  Abdominal:     General: Abdomen is flat.     Palpations: Abdomen is soft.     Comments: No CVAT   Musculoskeletal:     Comments: 1 + edema bilaterally , no calf tenderness   Skin:    General: Skin is warm.     Capillary Refill: Capillary refill takes less than 2 seconds.  Neurological:     General: No focal deficit present.     Mental Status: She is alert.  Psychiatric:        Mood and Affect: Mood normal.        Behavior: Behavior normal.      ED Treatments / Results  Labs (all labs ordered are listed, but only abnormal results are displayed) Labs Reviewed  BASIC METABOLIC PANEL - Abnormal; Notable for the following components:      Result Value   Chloride 112 (*)    CO2 21 (*)    BUN 24 (*)    Calcium 7.7 (*)    All other components within normal limits  CBC - Abnormal; Notable for the following components:   Hemoglobin 9.9 (*)    HCT 32.2 (*)    MCH 25.4 (*)    RDW 18.6 (*)    Platelets 493 (*)    All other components within normal limits  URINALYSIS, ROUTINE W REFLEX MICROSCOPIC - Abnormal; Notable for the following components:   APPearance CLOUDY (*)    Specific Gravity, Urine 1.035 (*)    Hgb urine dipstick MODERATE (*)    Protein, ur >=300 (*)    Bacteria, UA RARE (*)    All other components within normal limits  URINE CULTURE  TROPONIN I  I-STAT BETA HCG BLOOD, ED ([facility], [facility], [facility] ONLY)    EKG EKG Interpretation  Date/Time:  Saturday March 19 2019 21:13:58 EDT Ventricular Rate:  82 PR Interval:  150 QRS  Duration: 74 QT Interval:  368 QTC Calculation: 429 R Axis:   1 Text Interpretation:  Normal sinus rhythm Septal infarct , age undetermined Abnormal ECG No significant  change since last tracing Confirmed by Wandra Arthurs ([id number]) on 03/19/2019 10:09:23 PM   Radiology Dg Chest 2 View  Result Date: 03/19/2019 CLINICAL DATA:  Arm and leg swelling for 1 week. Some intermittent chest pain. No shortness of breath. Patient taking Xarelto. Recently diagnosed with PE. EXAM: CHEST - 2 VIEW COMPARISON:  03/02/2019 FINDINGS: Cardiac silhouette is normal in size. No mediastinal or hilar masses. No evidence of adenopathy. Clear lungs.  No pleural effusion or pneumothorax. Skeletal structures are unremarkable. IMPRESSION: No active cardiopulmonary disease. Electronically Signed   By: Lajean Manes M.D.   On: 03/19/2019 21:39    Procedures Procedures (including critical care time)  Medications Ordered in ED Medications  sodium chloride flush (NS) 0.9 % injection 3 mL (3 mLs Intravenous Not Given 03/19/19 2231)  cephALEXin (KEFLEX) capsule 500 mg (has no administration in time range)  furosemide (LASIX) injection 40 mg (40 mg Intravenous Given 03/19/19 2229)     Initial Impression / Assessment and Plan / ED Course  I have reviewed the triage vital signs and the nursing notes.  Pertinent labs & imaging results that were available during my care of the patient were reviewed by me and considered in my medical decision making (see chart for details).       Yareth Kearse is a 43 y.o. female here with leg swelling, recent CT that showed possible pyelo. No fever or chills. Well appearing. No CVAT. Her UA showed some bacteria and 6-10 WBC. Will dc home with keflex for a week. Will also give lasix to help with leg swelling. Patient has some shortness of breath since the diagnosis of PE but that is not worsening and she is not hypoxic or tachycardic. I don't think she needs another CTA currently. She has follow up  with hematology and had hypercoagulable workup that is ongoing. Stable for discharge.     Final Clinical Impressions(s) / ED Diagnoses   Final diagnoses:  Leg swelling  Acute cystitis without hematuria    ED Discharge Orders         Ordered    furosemide (LASIX) 20 MG tablet  Daily     03/19/19 2318    cephALEXin (KEFLEX) 500 MG capsule  3 times daily     03/19/19 2330           Drenda Freeze, MD 03/19/19 [phone]

## 2019-03-19 NOTE — ED Triage Notes (Signed)
Pt reports arm and leg swelling since Sunday. Has had some intermittent chest pain. No SOB. Diagnosed with PE recently, has been taking Xarelto as prescribed.

## 2019-03-21 ENCOUNTER — Other Ambulatory Visit: Payer: BC Managed Care – PPO

## 2019-03-21 ENCOUNTER — Telehealth: Payer: Self-pay

## 2019-03-21 LAB — URINE CULTURE

## 2019-03-21 NOTE — Telephone Encounter (Signed)
Please advise if some of lab orders for today need to be cancelled since she had some labs at er 03-19-19. Her appointment today at lab is at 3:15   Copied from [place] [phone]. Topic: Quick Communication - See Telephone Encounter >> Mar 21, 2019  8:51 AM Loma Boston wrote: CRM for notification. See Telephone encounter for: 03/21/19. Pt needs Melissa or her nurse  to reach out to her. She went to ER on Sat. 6/13 due to excessive swelling and headache. They did a lot of labs and she is wanting to know if she should keep her lab appt for today??? She is feeling better but still has swelling. She has a sched appt with Melissa next week. Please advise at [phone] about appt. As soon as possible.

## 2019-03-21 NOTE — Telephone Encounter (Signed)
Please contact pt and advise her that I would still like her to complete the lab work that I ordered.  I will plan to see her for OV as scheduled. Sooner if new/worsening symptoms.

## 2019-03-21 NOTE — Telephone Encounter (Signed)
Please advise 

## 2019-03-21 NOTE — Telephone Encounter (Signed)
Wt Readings from Last 3 Encounters:  03/19/19 226 lb (102.5 kg)  02/03/18 245 lb 3.2 oz (111.2 kg)  12/22/17 255 lb 3.2 oz (115.8 kg)   Reports that her weight is 271.8.  240 weight gain.

## 2019-03-21 NOTE — Telephone Encounter (Signed)
Continuation- she believes her weight to be up significantly.  She reports that her sob is improved.  She will come in tomorrow for a face to face visit.

## 2019-03-21 NOTE — Telephone Encounter (Signed)
Advised PT of lab appt/ Pt also wants the nurse to know that her fluid build up id getting worse in her thighs and her skin is getting tighter. doesnt seem that the furosemide Rx is working/ please advise

## 2019-03-22 ENCOUNTER — Encounter: Payer: Self-pay | Admitting: Family

## 2019-03-22 ENCOUNTER — Ambulatory Visit (INDEPENDENT_AMBULATORY_CARE_PROVIDER_SITE_OTHER): Payer: BC Managed Care – PPO | Admitting: Family

## 2019-03-22 ENCOUNTER — Other Ambulatory Visit: Payer: Self-pay

## 2019-03-22 VITALS — BP 146/87 | HR 89 | Temp 98.6°F | Resp 16 | Ht 66.0 in | Wt 270.0 lb

## 2019-03-22 DIAGNOSIS — R601 Generalized edema: Secondary | ICD-10-CM | POA: Diagnosis not present

## 2019-03-22 DIAGNOSIS — R809 Proteinuria, unspecified: Secondary | ICD-10-CM

## 2019-03-22 DIAGNOSIS — I2699 Other pulmonary embolism without acute cor pulmonale: Secondary | ICD-10-CM

## 2019-03-22 DIAGNOSIS — N059 Unspecified nephritic syndrome with unspecified morphologic changes: Secondary | ICD-10-CM | POA: Diagnosis not present

## 2019-03-22 LAB — SEDIMENTATION RATE: Sed Rate: 66 mm/hr — ABNORMAL HIGH (ref 0–20)

## 2019-03-22 NOTE — Patient Instructions (Signed)
Please keep your upcoming appointments with the kidney doctor and the hematologist.

## 2019-03-22 NOTE — Progress Notes (Signed)
Subjective:    Patient ID: Tiffany Hubbard, female    DOB: 1976/09/16, 43 y.o.   MRN: [id number]  HPI  Patient is a 43 year old female who presents today with chief complaint of lower extremity edema.  Recent medical history is positive for pulmonary embolus which was diagnosed on 03/02/2019.  She is maintained on Xarelto following this diagnosis.  There was no precipitating factor for her pulmonary embolus.  She did undergo a lower extremity Doppler at the time of her PE diagnosis which was negative for bilateral lower extremity DVT.  She has an upcoming appointment scheduled with hematology.  This is scheduled for June 19.  During her visit to the emergency department incidental finding of proteinuria was noted.  Her albumin was noted to be low at 2.4.  In addition her overall total protein was noted to be low at 6.3.  As a result the following labs have been ordered: 24-hour urine for protein, urine protein electrophoresis, ANA.  In addition a referral has been made to [place].  Left adrenal mass-this was an incidental finding on her recent CT angiogram of the chest.  This prompted a follow-up CT of the abdomen which revealed a 1.9 cm benign left adrenal adenoma.  Note was made of nonspecific bilateral perinephric soft tissue stranding.  This was felt possibly to be due to pyelonephritis versus medical renal disease.  She returned to the emergency department on June 13 due to complaint of worsening lower extremity edema.  Urinalysis showed some white blood cells and she was discharged home on Keflex for 1 week.  She was also given Lasix to help with her lower extremity edema.  He has an appointment with nephrology tomorrow.   Reports 6/13 weight is incorrect.  Reports weight went down to 240 after she completed 5-6 days of lasix.    Wt Readings from Last 3 Encounters:  03/22/19 270 lb (122.5 kg)  03/19/19 226 lb (102.5 kg)  02/03/18 245 lb 3.2 oz (111.2 kg)   She has been  on lasix now for about 3 days. She reports that her LE swelling is improved. She denies SOB.    Review of Systems See HPI  Past Medical History:  Diagnosis Date  . Asthma   . Bronchitis   . Pulmonary emboli [facility])      Social History   Socioeconomic History  . Marital status: Married    Spouse name: Not on file  . Number of children: Not on file  . Years of education: Not on file  . Highest education level: Not on file  Occupational History  . 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 Financial resource strain: Not on file  . Food insecurity    Worry: Not on file    Inability: Not on file  . Transportation needs    Medical: Not on file    Non-medical: Not on file  Tobacco Use  . Smoking status: Never Smoker  . Smokeless tobacco: Never Used  Substance and Sexual Activity  . Alcohol use: Yes    Comment: occasionally  . Drug use: No  . Sexual activity: Yes    Partners: Male    Birth control/protection: Surgical  Lifestyle  . Physical activity    Days per week: Not on file    Minutes per session: Not on file  . Stress: Not on file  Relationships  . Social connections    Talks  on phone: Not on file    Gets together: Not on file    Attends religious service: Not on file    Active member of club or organization: Not on file    Attends meetings of clubs or organizations: Not on file    Relationship status: Not on file  . Intimate partner violence    Fear of current or ex partner: Not on file    Emotionally abused: Not on file    Physically abused: Not on file    Forced sexual activity: Not on file  Other Topics Concern  . Not on file  Social History Narrative   No children   Step daughter- does not live with patient   Married   1 dog shitzu   Enjoys movies, books, travelling   Completed bachelors degree    Past Surgical History:  Procedure Laterality Date  . ECTOPIC PREGNANCY SURGERY      Family History   Problem Relation Age of Onset  . Lung cancer Mother   . Breast cancer Mother 79  . Diabetes Mother   . Heart disease Father 3  . Hypertension Father   . Diabetes Maternal Grandmother   . Stomach cancer Maternal Grandmother   . Arthritis Maternal Grandfather   . Colon cancer Maternal Aunt        2 aunts  . Colon cancer Maternal Uncle     No Known Allergies  Current Outpatient Medications on File Prior to Visit  Medication Sig Dispense Refill  . acetaminophen (TYLENOL) 500 MG tablet Take 500 mg by mouth every 6 (six) hours as needed.    Lianne Moris ALLERGY & CONGESTION 60-120 MG 12 hr tablet Take 1 tablet by mouth 2 (two) times daily. 30 tablet 5  . cephALEXin (KEFLEX) 500 MG capsule Take 1 capsule (500 mg total) by mouth 3 (three) times daily. 21 capsule 0  . furosemide (LASIX) 20 MG tablet Take 1 tablet (20 mg total) by mouth daily. 3 tablet 0  . lidocaine (LIDODERM) 5 % Place 1 patch onto the skin daily. Remove & Discard patch within 12 hours or as directed by MD 15 patch 0  . OVER THE COUNTER MEDICATION Take 1 tablet by mouth every 12 (twelve) hours. Biotrim    . rivaroxaban (XARELTO) 20 MG TABS tablet Take 1 tablet (20 mg total) by mouth daily with supper. (Patient taking differently: Take 20 mg by mouth daily. ) 30 tablet 1  . triamcinolone (KENALOG) 0.025 % cream Apply 1 application topically 2 (two) times daily as needed (eczema).     No current facility-administered medications on file prior to visit.     BP (!) 146/87 (BP Location: Right Arm, Patient Position: Sitting, Cuff Size: Large)   Pulse 89   Temp 98.6 F (37 C) (Oral)   Resp 16   Ht 5' 6"  (1.676 m)   Wt 270 lb (122.5 kg)   LMP 03/08/2019   SpO2 100%   BMI 43.58 kg/m       Objective:   Physical Exam Constitutional:      Appearance: She is well-developed.  Eyes:     Comments: + swelling of upper eyelids  Neck:     Musculoskeletal: Neck supple.     Thyroid: No thyromegaly.  Cardiovascular:     Rate  and Rhythm: Normal rate and regular rhythm.     Heart sounds: Normal heart sounds. No murmur.  Pulmonary:     Effort: Pulmonary effort is normal. No respiratory  distress.     Breath sounds: Normal breath sounds. No wheezing.  Musculoskeletal:     Right lower leg: 2+ Edema present.     Left lower leg: 2+ Edema present.  Skin:    General: Skin is warm and dry.  Neurological:     Mental Status: She is alert and oriented to person, place, and time.  Psychiatric:        Behavior: Behavior normal.        Thought Content: Thought content normal.        Judgment: Judgment normal.            Assessment & Plan:  PE-  ? If related to hypercoaguable state in the setting of proteinuria.  Clinically stable on xarelto. Continue same, has appointment with hematology for consult.  Proteinura-  ? Secondary to vasculitis. Will check UPEP, 24 hr urine for protein, ANA, ESR, Rheumatoid factor, acute hep panel, HIV.  She is scheduled to see nephrology for consult as well.  Anasarca- likely due to hypoproteinemia. Continue lasix prn.

## 2019-03-23 ENCOUNTER — Telehealth: Payer: Self-pay | Admitting: Family

## 2019-03-23 DIAGNOSIS — R3129 Other microscopic hematuria: Secondary | ICD-10-CM | POA: Diagnosis not present

## 2019-03-23 DIAGNOSIS — R93429 Abnormal radiologic findings on diagnostic imaging of unspecified kidney: Secondary | ICD-10-CM | POA: Diagnosis not present

## 2019-03-23 DIAGNOSIS — R809 Proteinuria, unspecified: Secondary | ICD-10-CM | POA: Diagnosis not present

## 2019-03-23 DIAGNOSIS — I129 Hypertensive chronic kidney disease with stage 1 through stage 4 chronic kidney disease, or unspecified chronic kidney disease: Secondary | ICD-10-CM | POA: Diagnosis not present

## 2019-03-23 LAB — PROTEIN ELECTROPHORESIS, URINE REFLEX
Albumin ELP, Urine: 61 %
Alpha-1-Globulin, U: 7.6 %
Alpha-2-Globulin, U: 7 %
Beta Globulin, U: 13.2 %
Gamma Globulin, U: 11.1 %
Protein, Ur: 1405.7 mg/dL

## 2019-03-23 NOTE — Telephone Encounter (Signed)
Medication Refill - Medication: triamcinolone (KENALOG) 0.025 % cream    Has the patient contacted their pharmacy? Yes.   (Agent: If no, request that the patient contact the pharmacy for the refill.) (Agent: If yes, when and what did the pharmacy advise?)  Preferred Pharmacy (with phone number or street name): [facility]   Agent: Please be advised that RX refills may take up to 3 business days. We ask that you follow-up with your pharmacy.

## 2019-03-24 LAB — HEPATITIS PANEL, ACUTE
Hep A IgM: NONREACTIVE
Hep B C IgM: NONREACTIVE
Hepatitis B Surface Ag: NONREACTIVE
Hepatitis C Ab: NONREACTIVE
SIGNAL TO CUT-OFF: 0.01 (ref <1.00)

## 2019-03-24 LAB — ANA: Anti Nuclear Antibody (ANA): NEGATIVE

## 2019-03-24 LAB — RHEUMATOID FACTOR: Rheumatoid fact SerPl-aCnc: <14 IU/mL (ref <14)

## 2019-03-24 LAB — HIV ANTIBODY (ROUTINE TESTING W REFLEX): HIV 1&2 Ab, 4th Generation: NONREACTIVE

## 2019-03-24 LAB — ANCA SCREEN W REFLEX TITER: ANCA Screen: NEGATIVE

## 2019-03-24 MED ORDER — TRIAMCINOLONE ACETONIDE 0.025 % EX CREA: 1.0000 "application " | TOPICAL_CREAM | Freq: Two times a day (BID) | CUTANEOUS | 1 refills | Status: DC | PRN

## 2019-03-25 ENCOUNTER — Encounter: Payer: Self-pay | Admitting: Family

## 2019-03-25 ENCOUNTER — Other Ambulatory Visit: Payer: Self-pay

## 2019-03-25 ENCOUNTER — Inpatient Hospital Stay (HOSPITAL_BASED_OUTPATIENT_CLINIC_OR_DEPARTMENT_OTHER): Payer: BC Managed Care – PPO | Admitting: Hematology

## 2019-03-25 ENCOUNTER — Other Ambulatory Visit: Payer: BC Managed Care – PPO

## 2019-03-25 ENCOUNTER — Inpatient Hospital Stay: Payer: BC Managed Care – PPO | Attending: Hematology

## 2019-03-25 ENCOUNTER — Encounter: Payer: Self-pay | Admitting: Hematology

## 2019-03-25 VITALS — BP 98/68 | HR 83 | Temp 98.8°F | Resp 18 | Ht 66.0 in | Wt 274.4 lb

## 2019-03-25 DIAGNOSIS — D3500 Benign neoplasm of unspecified adrenal gland: Secondary | ICD-10-CM | POA: Insufficient documentation

## 2019-03-25 DIAGNOSIS — R809 Proteinuria, unspecified: Secondary | ICD-10-CM | POA: Diagnosis not present

## 2019-03-25 DIAGNOSIS — D3502 Benign neoplasm of left adrenal gland: Secondary | ICD-10-CM

## 2019-03-25 DIAGNOSIS — D75839 Thrombocytosis, unspecified: Secondary | ICD-10-CM

## 2019-03-25 DIAGNOSIS — N051 Unspecified nephritic syndrome with focal and segmental glomerular lesions: Secondary | ICD-10-CM | POA: Insufficient documentation

## 2019-03-25 DIAGNOSIS — Z7901 Long term (current) use of anticoagulants: Secondary | ICD-10-CM | POA: Insufficient documentation

## 2019-03-25 DIAGNOSIS — D500 Iron deficiency anemia secondary to blood loss (chronic): Secondary | ICD-10-CM | POA: Insufficient documentation

## 2019-03-25 DIAGNOSIS — D509 Iron deficiency anemia, unspecified: Secondary | ICD-10-CM | POA: Insufficient documentation

## 2019-03-25 DIAGNOSIS — I2699 Other pulmonary embolism without acute cor pulmonale: Secondary | ICD-10-CM | POA: Diagnosis not present

## 2019-03-25 DIAGNOSIS — R7989 Other specified abnormal findings of blood chemistry: Secondary | ICD-10-CM

## 2019-03-25 DIAGNOSIS — D473 Essential (hemorrhagic) thrombocythemia: Secondary | ICD-10-CM

## 2019-03-25 DIAGNOSIS — N920 Excessive and frequent menstruation with regular cycle: Secondary | ICD-10-CM | POA: Insufficient documentation

## 2019-03-25 DIAGNOSIS — N924 Excessive bleeding in the premenopausal period: Secondary | ICD-10-CM

## 2019-03-25 DIAGNOSIS — D649 Anemia, unspecified: Secondary | ICD-10-CM

## 2019-03-25 DIAGNOSIS — E278 Other specified disorders of adrenal gland: Secondary | ICD-10-CM

## 2019-03-25 LAB — CMP (CANCER CENTER ONLY)
ALT: 15 U/L (ref 0–44)
AST: 17 U/L (ref 15–41)
Albumin: 2 g/dL — ABNORMAL LOW (ref 3.5–5.0)
Alkaline Phosphatase: 58 U/L (ref 38–126)
Anion gap: 3 — ABNORMAL LOW (ref 5–15)
BUN: 15 mg/dL (ref 6–20)
CO2: 24 mmol/L (ref 22–32)
Calcium: 7.8 mg/dL — ABNORMAL LOW (ref 8.9–10.3)
Chloride: 109 mmol/L (ref 98–111)
Creatinine: 0.81 mg/dL (ref 0.44–1.00)
GFR, Est AFR Am: >60 mL/min (ref >60)
GFR, Estimated: >60 mL/min (ref >60)
Glucose, Bld: 82 mg/dL (ref 70–99)
Potassium: 4 mmol/L (ref 3.5–5.1)
Sodium: 136 mmol/L (ref 135–145)
Total Bilirubin: 0.2 mg/dL — ABNORMAL LOW (ref 0.3–1.2)
Total Protein: 4.8 g/dL — ABNORMAL LOW (ref 6.5–8.1)

## 2019-03-25 LAB — CBC WITH DIFFERENTIAL (CANCER CENTER ONLY)
Abs Immature Granulocytes: 0.01 10*3/uL (ref 0.00–0.07)
Basophils Absolute: 0.1 10*3/uL (ref 0.0–0.1)
Basophils Relative: 1 %
Eosinophils Absolute: 0.1 10*3/uL (ref 0.0–0.5)
Eosinophils Relative: 2 %
HCT: 32.4 % — ABNORMAL LOW (ref 36.0–46.0)
Hemoglobin: 10.2 g/dL — ABNORMAL LOW (ref 12.0–15.0)
Immature Granulocytes: 0 %
Lymphocytes Relative: 29 %
Lymphs Abs: 1.6 10*3/uL (ref 0.7–4.0)
MCH: 25.6 pg — ABNORMAL LOW (ref 26.0–34.0)
MCHC: 31.5 g/dL (ref 30.0–36.0)
MCV: 81.2 fL (ref 80.0–100.0)
Monocytes Absolute: 0.3 10*3/uL (ref 0.1–1.0)
Monocytes Relative: 6 %
Neutro Abs: 3.4 10*3/uL (ref 1.7–7.7)
Neutrophils Relative %: 62 %
Platelet Count: 466 10*3/uL — ABNORMAL HIGH (ref 150–400)
RBC: 3.99 MIL/uL (ref 3.87–5.11)
RDW: 18.5 % — ABNORMAL HIGH (ref 11.5–15.5)
WBC Count: 5.4 10*3/uL (ref 4.0–10.5)
nRBC: 0 % (ref 0.0–0.2)

## 2019-03-25 LAB — SAVE SMEAR(SSMR), FOR PROVIDER SLIDE REVIEW

## 2019-03-28 ENCOUNTER — Encounter: Payer: Self-pay | Admitting: Family

## 2019-03-28 DIAGNOSIS — I129 Hypertensive chronic kidney disease with stage 1 through stage 4 chronic kidney disease, or unspecified chronic kidney disease: Secondary | ICD-10-CM | POA: Diagnosis not present

## 2019-03-28 DIAGNOSIS — R3129 Other microscopic hematuria: Secondary | ICD-10-CM | POA: Diagnosis not present

## 2019-03-28 DIAGNOSIS — R809 Proteinuria, unspecified: Secondary | ICD-10-CM | POA: Diagnosis not present

## 2019-03-28 DIAGNOSIS — E8770 Fluid overload, unspecified: Secondary | ICD-10-CM | POA: Diagnosis not present

## 2019-03-28 LAB — UPEP/TP, 24-HR URINE
Albumin, U: 71.9 %
Alpha 1, Urine: 2.6 %
Alpha 2, Urine: 6.7 %
Beta, Urine: 11.1 %
Gamma Globulin, Urine: 7.6 %
Protein, 24H Urine: 20030 mg/24 hr — ABNORMAL HIGH (ref 30–150)
Protein, Ur: 2003 mg/dL

## 2019-03-29 ENCOUNTER — Ambulatory Visit: Payer: BLUE CROSS/BLUE SHIELD | Admitting: Family

## 2019-03-29 ENCOUNTER — Telehealth: Payer: Self-pay | Admitting: Family

## 2019-03-29 ENCOUNTER — Other Ambulatory Visit (HOSPITAL_COMMUNITY): Payer: Self-pay | Admitting: Nephrology

## 2019-03-29 ENCOUNTER — Other Ambulatory Visit: Payer: Self-pay | Admitting: Nephrology

## 2019-03-29 DIAGNOSIS — R809 Proteinuria, unspecified: Secondary | ICD-10-CM

## 2019-03-29 MED ORDER — FLUTICASONE PROPIONATE 50 MCG/ACT NA SUSP: 2.0000 | Freq: Every day | NASAL | 6 refills | Status: DC

## 2019-03-29 NOTE — Telephone Encounter (Signed)
Please contact pt and let her know that her 24 hr urine did show a large amount of protein. I would like to forward this to her kidney doctor. Can you please ask her which nephrologist she saw at [facility] last week?

## 2019-03-30 ENCOUNTER — Other Ambulatory Visit: Payer: Self-pay | Admitting: Radiology

## 2019-03-30 NOTE — Telephone Encounter (Signed)
Results faxed to Dr. Royce Macadamia at [phone]

## 2019-03-31 ENCOUNTER — Other Ambulatory Visit: Payer: Self-pay | Admitting: Radiology

## 2019-04-01 ENCOUNTER — Ambulatory Visit (HOSPITAL_COMMUNITY)
Admission: RE | Admit: 2019-04-01 | Discharge: 2019-04-01 | Disposition: A | Discharge status: Home / Self Care | Payer: BC Managed Care – PPO | Source: Ambulatory Visit | Attending: Nephrology | Admitting: Nephrology

## 2019-04-01 ENCOUNTER — Encounter (HOSPITAL_COMMUNITY): Payer: Self-pay

## 2019-04-01 ENCOUNTER — Other Ambulatory Visit: Payer: Self-pay

## 2019-04-01 ENCOUNTER — Ambulatory Visit: Payer: BC Managed Care – PPO

## 2019-04-01 DIAGNOSIS — I2699 Other pulmonary embolism without acute cor pulmonale: Secondary | ICD-10-CM | POA: Diagnosis not present

## 2019-04-01 DIAGNOSIS — Z79899 Other long term (current) drug therapy: Secondary | ICD-10-CM | POA: Diagnosis not present

## 2019-04-01 DIAGNOSIS — R809 Proteinuria, unspecified: Secondary | ICD-10-CM | POA: Diagnosis not present

## 2019-04-01 DIAGNOSIS — J45909 Unspecified asthma, uncomplicated: Secondary | ICD-10-CM | POA: Diagnosis not present

## 2019-04-01 DIAGNOSIS — Z7901 Long term (current) use of anticoagulants: Secondary | ICD-10-CM | POA: Insufficient documentation

## 2019-04-01 LAB — CBC
HCT: 30.5 % — ABNORMAL LOW (ref 36.0–46.0)
Hemoglobin: 9.8 g/dL — ABNORMAL LOW (ref 12.0–15.0)
MCH: 26.1 pg (ref 26.0–34.0)
MCHC: 32.1 g/dL (ref 30.0–36.0)
MCV: 81.3 fL (ref 80.0–100.0)
Platelets: 432 10*3/uL — ABNORMAL HIGH (ref 150–400)
RBC: 3.75 MIL/uL — ABNORMAL LOW (ref 3.87–5.11)
RDW: 19.2 % — ABNORMAL HIGH (ref 11.5–15.5)
WBC: 6.4 10*3/uL (ref 4.0–10.5)
nRBC: 0 % (ref 0.0–0.2)

## 2019-04-01 LAB — PROTIME-INR
INR: 1 (ref 0.8–1.2)
Prothrombin Time: 13.5 seconds (ref 11.4–15.2)

## 2019-04-01 IMAGING — US ULTRASOUND CORE BIOPSY
1 series · 12 of 12 positions shown · non-contrast
Comparison: none

INDICATION: 44-year-old with proteinuria. Scheduled for random renal biopsy.
Patient has held her Xarelto for this procedure.

[Series 1: ultrasound core biopsy · 12 of 12 slices shown]
[im 1/12]
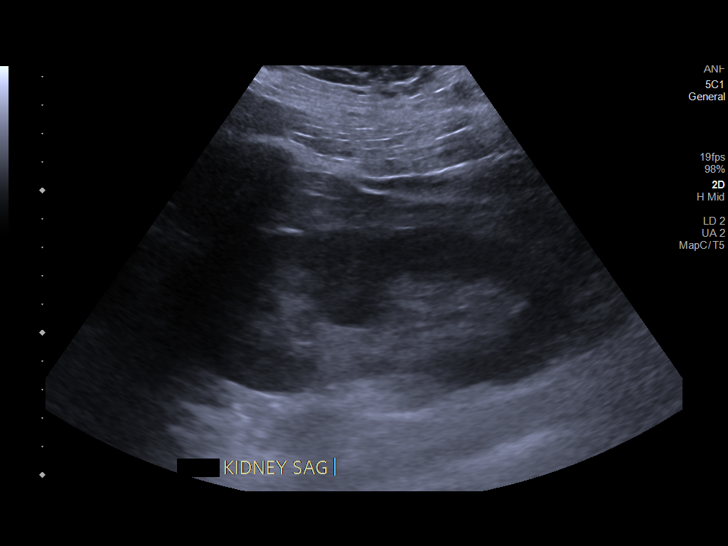
[im 2/12]
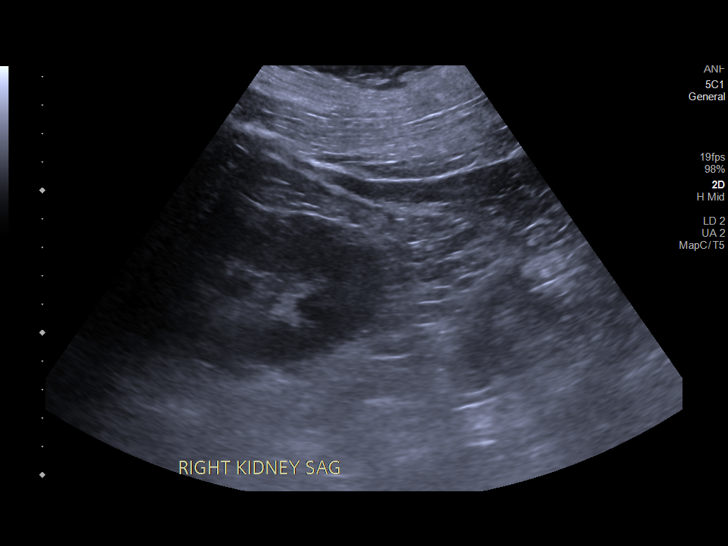
[im 3/12]
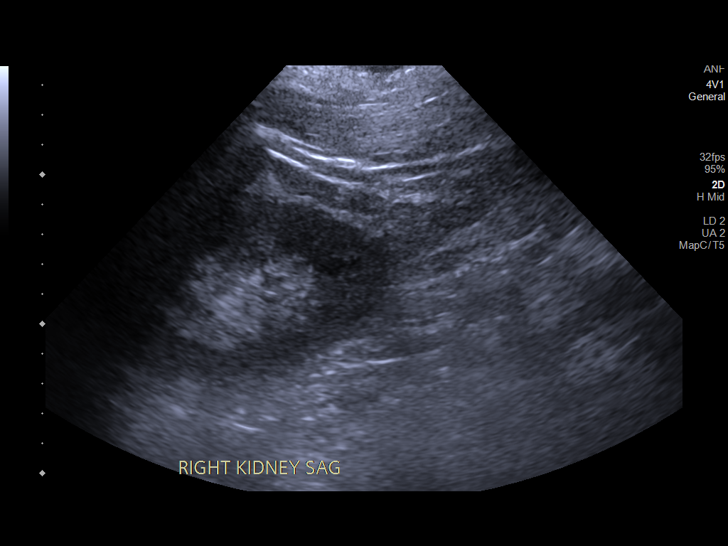
[im 4/12]
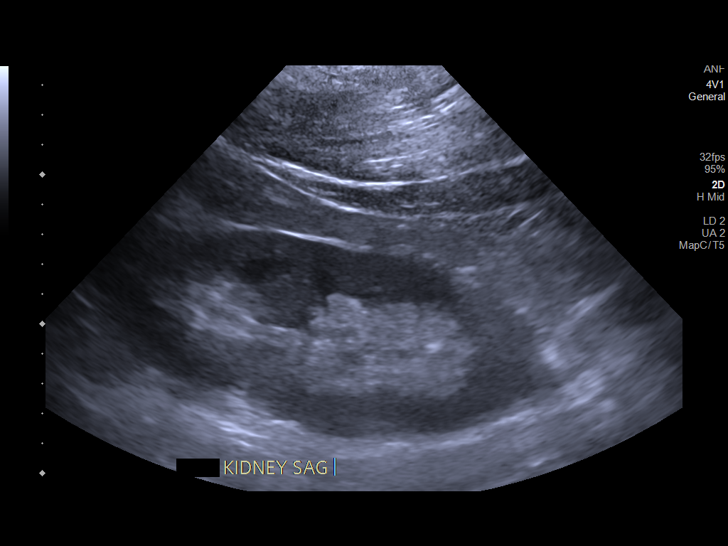
[im 5/12]
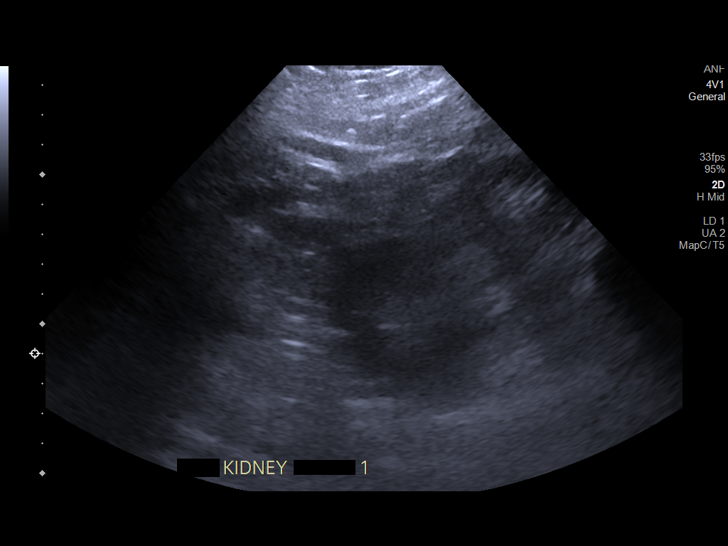
[im 6/12]
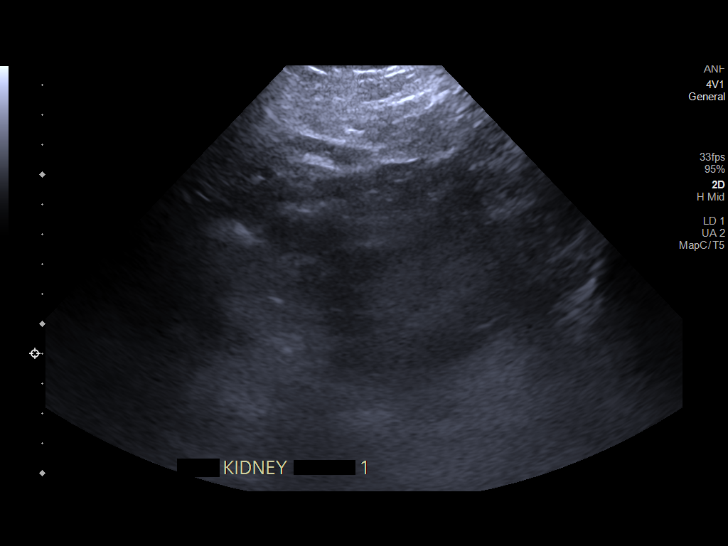
[im 7/12]
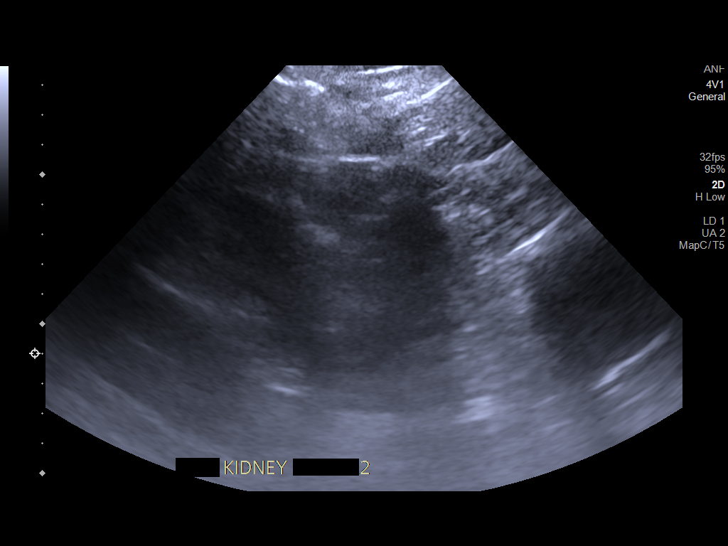
[im 8/12]
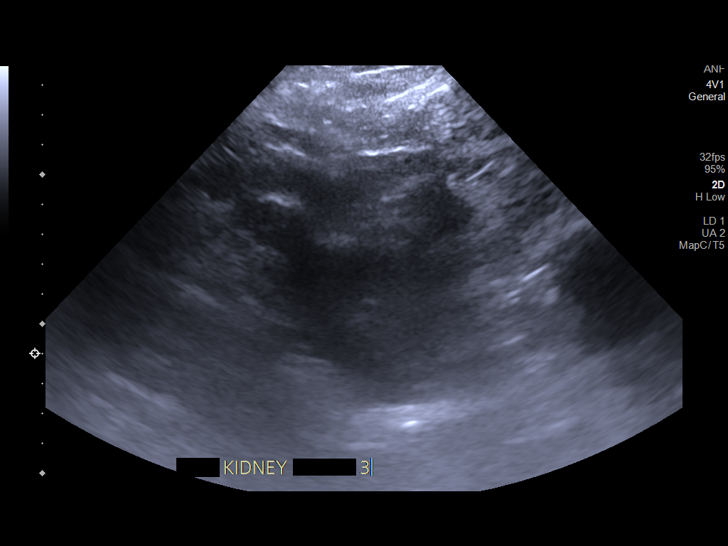
[im 9/12]
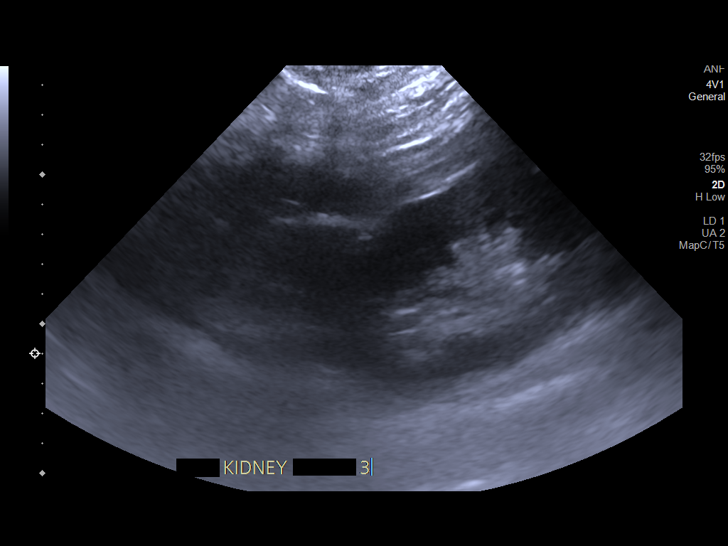
[im 10/12]
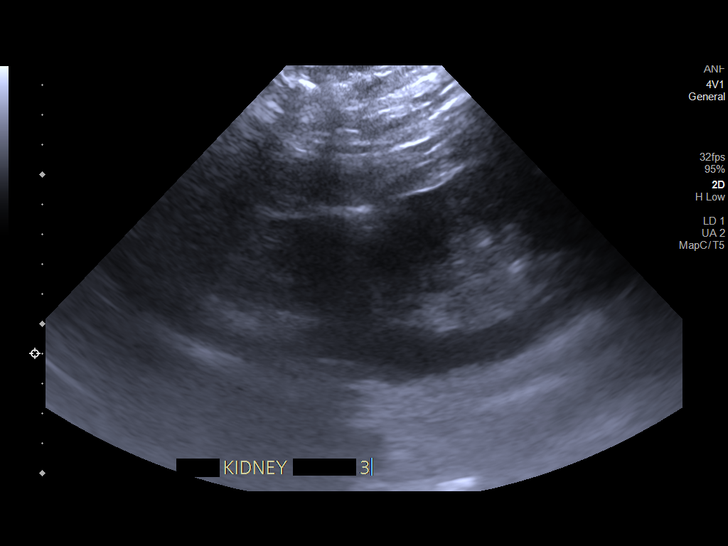
[im 11/12]
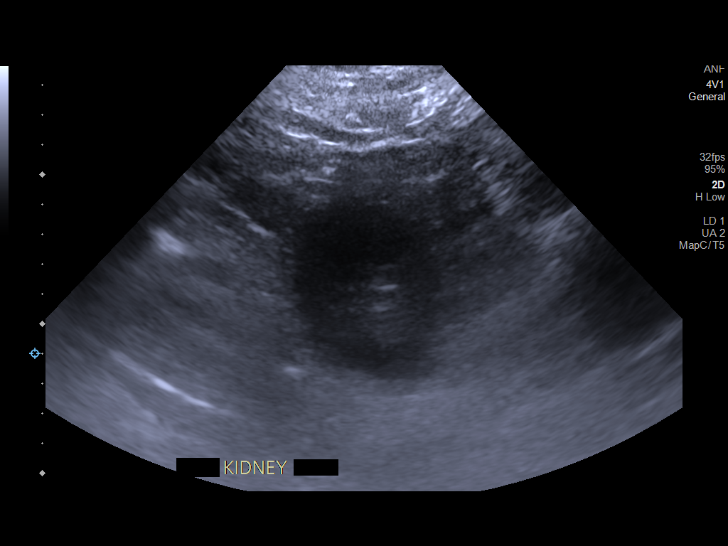
[im 12/12]
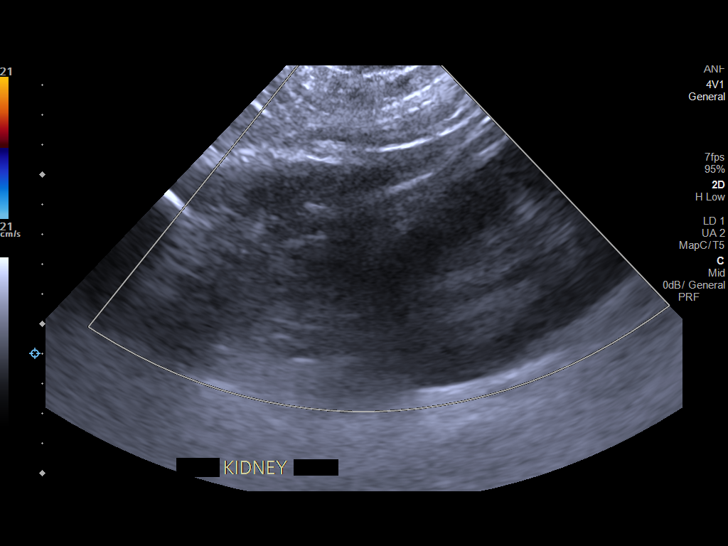

[12 of 12 positions shown; findings below may reference images not displayed]

EXAM:
ULTRASOUND-GUIDED LEFT RENAL BIOPSY

MEDICATIONS:
None.

ANESTHESIA/SEDATION:
Moderate (conscious) sedation was employed during this procedure. A
total of Versed 2.0 mg and Fentanyl 50 mcg was administered
intravenously.

Moderate Sedation Time: 16 minutes. The patient's level of
consciousness and vital signs were monitored continuously by
radiology nursing throughout the procedure under my direct
supervision.

FLUOROSCOPY TIME:  None

COMPLICATIONS:
None immediate.

PROCEDURE:
Informed written consent was obtained from the patient after a
thorough discussion of the procedural risks, benefits and
alternatives. All questions were addressed. Maximal Sterile Barrier
Technique was utilized including caps, mask, sterile gowns, sterile
gloves, sterile drape, hand hygiene and skin antiseptic. A timeout
was performed prior to the initiation of the procedure.

Patient was placed prone. Both kidneys were evaluated with
ultrasound. Left kidney lower pole was selected for biopsy. The left
flank was prepped with chlorhexidine and sterile field was created.
Skin and deep soft tissues were anesthetized with 1% lidocaine.
Using ultrasound guidance, 16 gauge core needle was directed into
the lower pole cortex. Total of 3 core biopsies were performed. Two
adequate core specimens were obtained. Specimens placed in saline.
Bandage placed over the puncture site.
FINDINGS: Negative for hydronephrosis. Core biopsies obtained from the left
kidney lower pole cortex. No significant bleeding or hematoma
formation following the core biopsies.
IMPRESSION: Ultrasound-guided core biopsies of the left kidney lower pole.

## 2019-04-01 MED ORDER — SODIUM CHLORIDE 0.9 % IV SOLN: INTRAVENOUS | Status: DC

## 2019-04-01 MED ORDER — MIDAZOLAM HCL 2 MG/2ML IJ SOLN
INTRAMUSCULAR | Status: AC
  Filled 2019-04-01: qty 2

## 2019-04-01 MED ORDER — FENTANYL CITRATE (PF) 100 MCG/2ML IJ SOLN
INTRAMUSCULAR | Status: AC | PRN
  Administered 2019-04-01: 50 ug via INTRAVENOUS

## 2019-04-01 MED ORDER — HYDROCODONE-ACETAMINOPHEN 5-325 MG PO TABS: 1.0000 | ORAL_TABLET | ORAL | Status: DC | PRN

## 2019-04-01 MED ORDER — MIDAZOLAM HCL 2 MG/2ML IJ SOLN
INTRAMUSCULAR | Status: AC | PRN
  Administered 2019-04-01 (×2): 1 mg via INTRAVENOUS

## 2019-04-01 MED ORDER — LIDOCAINE HCL (PF) 1 % IJ SOLN
INTRAMUSCULAR | Status: AC
  Filled 2019-04-01: qty 30

## 2019-04-01 MED ORDER — FENTANYL CITRATE (PF) 100 MCG/2ML IJ SOLN
INTRAMUSCULAR | Status: AC
  Filled 2019-04-01: qty 2

## 2019-04-01 NOTE — Procedures (Signed)
Interventional Radiology Procedure:   Indications: Proteinuria  Procedure: US guided core biopsy of left kidney lower pole  Findings: 3 cores performed, 2 adequate specimens obtained  Complications: None     EBL: Less than 10 ml  Plan: Bedrest 4 hours then discharge to home.      Catrell Morrone R. Anselm Pancoast, MD  Pager: [phone]

## 2019-04-01 NOTE — Progress Notes (Signed)
Patient assessed this afternoon after renal biopsy this AM.  Denies concerns.  No pain.  Resting comfortably on left side.  VSS.  HR 79, BP 144/48  Patient stable for discharge home at scheduled time.   Brynda Greathouse, MS RD PA-C

## 2019-04-01 NOTE — H&P (Signed)
Chief Complaint: Patient was seen in consultation today for random renal biopsy.  Referring Physician(s): Claudia Desanctis  Supervising Physician: Aletta Edouard  Patient Status: [facility] - Out-pt  History of Present Illness: Tiffany Hubbard is a 43 y.o. female with a past medical history significant for asthma, bronchitis, left adrenal adenoma, normocytic anemia, menorrhagia, unprovoked PE currently on Xarelto and proteinuria of unknown etiology followed by Dr. Royce Macadamia who presents today for a random renal biopsy. Tiffany Hubbard originally presented to the ER with complaints of left sided chest pain on 03/02/19 - after further evaluation she was found to have an unprovoked left upper lobe pulmonary embolus. Lower extremity doppler at that time was negative for DVT. She was started on Xarelto and discharged with hematology follow up - she was seen by their service on 03/25/19 and continues on Xarelto. During her hospital visit her UA incidentally showed proteinuria. She was referred to nephrology by her PCP for further evaluation of the proteinuria and request has been made by Dr. Royce Macadamia for a random renal biopsy, for which she presents today.  Tiffany Hubbard denies any complaints today, she is a little worried about bleeding risk due to being on Xarelto. She reports that her last dose of Xarelto was 48 hours ago. She also states that her blood pressure has been elevated recently but she does not take any medications for it at this time. She also endorses periodic headaches, none today. She states understanding of the procedure requested and wishes to proceed.   Past Medical History:  Diagnosis Date   Asthma    Bronchitis    Pulmonary emboli (HCC)     Past Surgical History:  Procedure Laterality Date   ECTOPIC PREGNANCY SURGERY      Allergies: Patient has no known allergies.  Medications: Prior to Admission medications   Medication Sig Start Date End Date Taking? Authorizing Provider    acetaminophen (TYLENOL) 500 MG tablet Take 1,000 mg by mouth every 6 (six) hours as needed for moderate pain.    Yes [provider]  furosemide (LASIX) 20 MG tablet Take 1 tablet (20 mg total) by mouth daily. 03/19/19  Yes Drenda Freeze, MD  lisinopril (ZESTRIL) 10 MG tablet Take 10 mg by mouth daily.  03/23/19  Yes [provider]  metoprolol tartrate (LOPRESSOR) 25 MG tablet Take 25 mg by mouth 2 (two) times a day. 03/28/19  Yes [provider]  rivaroxaban (XARELTO) 20 MG TABS tablet Take 1 tablet (20 mg total) by mouth daily with supper. Patient taking differently: Take 20 mg by mouth daily.  03/17/19  Yes Debbrah Alar, NP  triamcinolone (KENALOG) 0.025 % cream Apply 1 application topically 2 (two) times daily as needed (eczema). 03/24/19  Yes Debbrah Alar, NP  fluticasone (FLONASE) 50 MCG/ACT nasal spray Place 2 sprays into both nostrils daily. Patient taking differently: Place 2 sprays into both nostrils daily as needed for allergies.  03/29/19   Debbrah Alar, NP     Family History  Problem Relation Age of Onset   Lung cancer Mother    Breast cancer Mother 23   Diabetes Mother    Heart disease Father 23   Hypertension Father    Diabetes Maternal Grandmother    Stomach cancer Maternal Grandmother    Arthritis Maternal Grandfather    Colon cancer Maternal Aunt        2 aunts   Colon cancer Maternal Uncle     Social History   Socioeconomic History  Marital status: Married    Spouse name: Not on file   Number of children: Not on file   Years of education: Not on file   Highest education level: Not on file  Occupational History   Occupation: Theme park manager: [employer]    Comment: [employer]  Social Designer, fashion/clothing strain: Not on file   Food insecurity    Worry: Not on file    Inability: Not on file   Transportation needs    Medical: Not on file    Non-medical: Not on file  Tobacco Use    Smoking status: Never Smoker   Smokeless tobacco: Never Used  Substance and Sexual Activity   Alcohol use: Yes    Comment: occasionally   Drug use: No   Sexual activity: Yes    Partners: Male    Birth control/protection: Surgical  Lifestyle   Physical activity    Days per week: Not on file    Minutes per session: Not on file   Stress: Not on file  Relationships   Social connections    Talks on phone: Not on file    Gets together: Not on file    Attends religious service: Not on file    Active member of club or organization: Not on file    Attends meetings of clubs or organizations: Not on file    Relationship status: Not on file  Other Topics Concern   Not on file  Social History Narrative   No children   Step daughter- does not live with patient   Married   1 dog shitzu   Enjoys movies, books, travelling   Completed bachelors degree     Review of Systems: A 12 point ROS discussed and pertinent positives are indicated in the HPI above.  All other systems are negative.  Review of Systems  Constitutional: Negative for chills and fever.  Respiratory: Negative for cough and shortness of breath.   Cardiovascular: Negative for chest pain and leg swelling.  Gastrointestinal: Negative for abdominal pain, diarrhea, nausea and vomiting.  Genitourinary: Negative for dysuria and hematuria.  Musculoskeletal: Negative for back pain.  Skin: Negative for rash.  Neurological: Positive for headaches (intermittent - none currently). Negative for dizziness and syncope.  Psychiatric/Behavioral: Negative for confusion.    Vital Signs: BP (!) 144/88    Pulse 91    Temp 98.4 F (36.9 C) (Oral)    Resp 16    Ht 5\' 6"  (1.676 m)    Wt 270 lb (122.5 kg)    LMP 03/29/2019 Comment: tubal ligation   SpO2 100%    BMI 43.58 kg/m   Physical Exam Vitals signs reviewed.  Constitutional:      General: She is not in acute distress.    Comments: Very pleasant, good historian.   HENT:      Head: Normocephalic.  Cardiovascular:     Rate and Rhythm: Normal rate and regular rhythm.  Pulmonary:     Effort: Pulmonary effort is normal.     Breath sounds: Normal breath sounds.  Abdominal:     General: Bowel sounds are normal. There is no distension.     Palpations: Abdomen is soft.     Tenderness: There is no abdominal tenderness.  Skin:    General: Skin is warm and dry.  Neurological:     Mental Status: She is alert and oriented to person, place, and time.  Psychiatric:  Mood and Affect: Mood normal.        Behavior: Behavior normal.        Thought Content: Thought content normal.        Judgment: Judgment normal.      MD Evaluation Airway: WNL Heart: WNL Abdomen: WNL Chest/ Lungs: WNL ASA  Classification: 2 Mallampati/Airway Score: One   Imaging: Dg Chest 2 View  Result Date: 03/19/2019 CLINICAL DATA:  Arm and leg swelling for 1 week. Some intermittent chest pain. No shortness of breath. Patient taking Xarelto. Recently diagnosed with PE. EXAM: CHEST - 2 VIEW COMPARISON:  03/02/2019 FINDINGS: Cardiac silhouette is normal in size. No mediastinal or hilar masses. No evidence of adenopathy. Clear lungs.  No pleural effusion or pneumothorax. Skeletal structures are unremarkable. IMPRESSION: No active cardiopulmonary disease. Electronically Signed   By: Lajean Manes M.D.   On: 03/19/2019 21:39   Ct Angio Chest Pe W/cm &/or Wo Cm  Result Date: 03/02/2019 CLINICAL DATA:  Shortness of breath.  Sharp left-sided chest pain. EXAM: CT ANGIOGRAPHY CHEST WITH CONTRAST TECHNIQUE: Multidetector CT imaging of the chest was performed using the standard protocol during bolus administration of intravenous contrast. Multiplanar CT image reconstructions and MIPs were obtained to evaluate the vascular anatomy. CONTRAST:  112mL OMNIPAQUE IOHEXOL 350 MG/ML SOLN COMPARISON:  None. FINDINGS: Cardiovascular: There is an acute pulmonary embolus involving a segmental branch of the left  upper lobe (axial series 11, image 156). There may be additional smaller subsegmental pulmonary emboli involving the left lower lobe. There is mild cardiac enlargement. No CT evidence of heart strain. Mediastinum/Nodes: No enlarged mediastinal, hilar, or axillary lymph nodes. Thyroid gland, trachea, and esophagus demonstrate no significant findings. Lungs/Pleura: There is a small right-sided pleural effusion. The lung volumes are somewhat low. There is a slight mosaic appearance of all of the lobe. Upper Abdomen: There is a complex 2.5 cm left adrenal nodule. The remaining portions of the upper abdomen are unremarkable. Musculoskeletal: No chest wall abnormality. No acute or significant osseous findings. Review of the MIP images confirms the above findings. IMPRESSION: 1. Acute segmental pulmonary embolus involving the left upper lobe. No evidence of heart strain. 2. The lungs are clear aside from a small right-sided pleural effusion. 3. Mildly enlarged heart. 4. Indeterminate 2.5 cm left adrenal nodule. Follow-up with a nonemergent outpatient adrenal mass protocol MRI or CT is recommended. These results were called by telephone at the time of interpretation on 03/02/2019 at 7:47 pm to [facility] , who verbally acknowledged these results. Electronically Signed   By: Constance Holster M.D.   On: 03/02/2019 19:55   Ct Abdomen W Wo Contrast  Result Date: 03/16/2019 CLINICAL DATA:  Indeterminate left adrenal mass on recent chest CTA. EXAM: CT ABDOMEN WITHOUT AND WITH CONTRAST TECHNIQUE: Multidetector CT imaging of the abdomen was performed following the standard protocol before and following the bolus administration of intravenous contrast. CONTRAST:  178mL OMNIPAQUE IOHEXOL 300 MG/ML  SOLN COMPARISON:  Chest CTA on 03/02/2019 FINDINGS: Lower chest: No acute findings. Hepatobiliary: No hepatic masses identified. Gallbladder is unremarkable. Pancreas:  No mass or inflammatory changes. Spleen:  Within  normal limits in size and appearance. Adrenals/Urinary Tract: 1.9 cm left adrenal mass. This shows contrast washout consistent with a benign adrenal adenoma. The right adrenal gland is normal appearance. No evidence of renal masses or hydronephrosis. Bilateral perinephric soft tissue stranding is seen which is nonspecific, and may be due to pyelonephritis or medical renal disease. Stomach/Bowel: Visualized portion unremarkable. Vascular/Lymphatic:  No pathologically enlarged lymph nodes identified. No abdominal aortic aneurysm. Other:  None. Musculoskeletal:  No suspicious bone lesions identified. IMPRESSION: 1.9 cm benign left adrenal adenoma. Nonspecific bilateral perinephric soft tissue stranding, which could be due to pyelonephritis or medical renal disease. Recommend correlation with urinalysis and BUN/creatinine levels. Electronically Signed   By: Earle Gell M.D.   On: 03/16/2019 16:32   Dg Chest Portable 1 View  Result Date: 03/02/2019 CLINICAL DATA:  Chest pain and dyspnea EXAM: PORTABLE CHEST 1 VIEW COMPARISON:  12/31/2018 FINDINGS: The heart size is stable. There is mild volume overload without overt pulmonary edema. There may be a trace right-sided pleural effusion. There is no pneumothorax. No acute osseous abnormality. IMPRESSION: Mild volume overload with a trace right-sided pleural effusion. Electronically Signed   By: Constance Holster M.D.   On: 03/02/2019 16:01   Vas [place] Lower Extremity Venous (dvt) (only Mc & Wl 7a-7p)  Result Date: 03/03/2019  Lower Venous Study Indications: Edema, SOB, and Chest pain.  Risk Factors: Obesity. Performing Technologist: Toma Copier RVS  Examination Guidelines: A complete evaluation includes B-mode imaging, spectral Doppler, color Doppler, and power Doppler as needed of all accessible portions of each vessel. Bilateral testing is considered an integral part of a complete examination. Limited examinations for reoccurring indications may be performed as  noted.  +---------+---------------+---------+-----------+----------+-------+  RIGHT     Compressibility Phasicity Spontaneity Properties Summary  +---------+---------------+---------+-----------+----------+-------+  CFV       Full            Yes       Yes                             +---------+---------------+---------+-----------+----------+-------+  SFJ       Full                                                      +---------+---------------+---------+-----------+----------+-------+  FV Prox   Full            Yes       Yes                             +---------+---------------+---------+-----------+----------+-------+  FV Mid    Full                                                      +---------+---------------+---------+-----------+----------+-------+  FV Distal Full            Yes       Yes                             +---------+---------------+---------+-----------+----------+-------+  PFV       Full            Yes       Yes                             +---------+---------------+---------+-----------+----------+-------+  POP       Full  Yes       Yes                             +---------+---------------+---------+-----------+----------+-------+  PTV       Full                                                      +---------+---------------+---------+-----------+----------+-------+  PERO      Full                                                      +---------+---------------+---------+-----------+----------+-------+   +---------+---------------+---------+-----------+----------+-------+  LEFT      Compressibility Phasicity Spontaneity Properties Summary  +---------+---------------+---------+-----------+----------+-------+  CFV       Full            Yes       Yes                             +---------+---------------+---------+-----------+----------+-------+  SFJ       Full                                                      +---------+---------------+---------+-----------+----------+-------+  FV  Prox   Full            Yes       Yes                             +---------+---------------+---------+-----------+----------+-------+  FV Mid    Full                                                      +---------+---------------+---------+-----------+----------+-------+  FV Distal Full            Yes       Yes                             +---------+---------------+---------+-----------+----------+-------+  PFV       Full            Yes       Yes                             +---------+---------------+---------+-----------+----------+-------+  POP       Full            Yes       Yes                             +---------+---------------+---------+-----------+----------+-------+  PTV       Full                                                      +---------+---------------+---------+-----------+----------+-------+  PERO      Full                                                      +---------+---------------+---------+-----------+----------+-------+     Summary: Right: There is no evidence of deep vein thrombosis in the lower extremity. No cystic structure found in the popliteal fossa. Left: There is no evidence of deep vein thrombosis in the lower extremity. No cystic structure found in the popliteal fossa.  *See table(s) above for measurements and observations. Electronically signed by Servando Snare MD on 03/03/2019 at 12:42:27 PM.    Final     Labs:  CBC: Recent Labs    03/02/19 1522 03/19/19 2121 03/25/19 0844 04/01/19 0612  WBC 6.6 6.3 5.4 6.4  HGB 11.4* 9.9* 10.2* 9.8*  HCT 36.4 32.2* 32.4* 30.5*  PLT 475* 493* 466* 432*    COAGS: Recent Labs    03/02/19 2038 04/01/19 0612  INR 1.1 1.0    BMP: Recent Labs    03/02/19 1522 03/19/19 2121 03/25/19 0844  NA 135 139 136  K 4.4 4.2 4.0  CL 108 112* 109  CO2 17* 21* 24  GLUCOSE 78 92 82  BUN 11 24* 15  CALCIUM 8.6* 7.7* 7.8*  CREATININE 0.91 0.98 0.81  GFRNONAA >60 >60 >60  GFRAA >60 >60 >60    LIVER FUNCTION TESTS: Recent  Labs    03/02/19 1522 03/25/19 0844  BILITOT 0.6 0.2*  AST 21 17  ALT 21 15  ALKPHOS 64 58  PROT 6.3* 4.8*  ALBUMIN 2.4* 2.0*    TUMOR MARKERS: No results for input(s): AFPTM, CEA, CA199, CHROMGRNA in the last 8760 hours.  Assessment and Plan:  43 y/o F with incidental proteinuria found on UA during hospital visit on 03/02/19 for acute LUL PE. She is currently followed by hematology for PE and continues on Xarelto. She was referred to Dr. Royce Macadamia (nephrology) for evaluation of proteinuria and request has been made to IR for image guided random renal biopsy.  Patient has been NPO since 9 pm last night, last dose of Xarelto 48 hours ago per patient, she did not take any medications this morning. Afebrile, WBC 6.4, hgb 9.8, plt 432, INR 1.0.  Risks and benefits discussed with the patient including, but not limited to bleeding, infection, damage to adjacent structures or low yield requiring additional tests.  All of the patient's questions were answered, patient is agreeable to proceed.  Consent signed and in chart.  Thank you for this interesting consult.  I greatly enjoyed meeting Tiffany Hubbard and look forward to participating in their care.  A copy of this report was sent to the requesting provider on this date.  Electronically Signed: Joaquim Nam, PA-C 04/01/2019, 7:28 AM   I spent a total of  30 Minutes  in face to face in clinical consultation, greater than 50% of which was counseling/coordinating care for random renal biopsy.

## 2019-04-01 NOTE — Discharge Instructions (Signed)
Percutaneous Kidney Biopsy, Care After °This sheet gives you information about how to care for yourself after your procedure. Your health care provider may also give you more specific instructions. If you have problems or questions, contact your health care provider. °What can I expect after the procedure? °After the procedure, it is common to have: °· Pain or soreness near the area where the needle went through your skin (biopsy site). °· Bright pink or cloudy urine for 24 hours after the procedure. °Follow these instructions at home: °Activity °· Return to your normal activities as told by your health care provider. Ask your health care provider what activities are safe for you. °· Do not drive for 24 hours if you were given a medicine to help you relax (sedative). °· Do not lift anything that is heavier than 10 lb (4.5 kg) until your health care provider tells you that it is safe. °· Avoid activities that take a lot of effort (are strenuous) until your health care provider approves. Most people will have to wait 2 weeks before returning to activities such as exercise or sexual intercourse. °General instructions ° °· Take over-the-counter and prescription medicines only as told by your health care provider. °· You may eat and drink after your procedure. Follow instructions from your health care provider about eating or drinking restrictions. °· Check your biopsy site every day for signs of infection. Check for: °? More redness, swelling, or pain. °? More fluid or blood. °? Warmth. °? Pus or a bad smell. °· Keep all follow-up visits as told by your health care provider. This is important. °Contact a health care provider if: °· You have more redness, swelling, or pain around your biopsy site. °· You have more fluid or blood coming from your biopsy site. °· Your biopsy site feels warm to the touch. °· You have pus or a bad smell coming from your biopsy site. °· You have blood in your urine more than 24 hours after  your procedure. °Get help right away if: °· You have dark red or brown urine. °· You have a fever. °· You are unable to urinate. °· You feel burning when you urinate. °· You feel faint. °· You have severe pain in your abdomen or side. °This information is not intended to replace advice given to you by your health care provider. Make sure you discuss any questions you have with your health care provider. °Document Released: 05/25/2013 Document Revised: 07/04/2016 Document Reviewed: 07/04/2016 °Elsevier Interactive Patient Education © 2019 [employer] °Moderate Conscious Sedation, Adult, Care After °These instructions provide you with information about caring for yourself after your procedure. Your health care provider may also give you more specific instructions. Your treatment has been planned according to current medical practices, but problems sometimes occur. Call your health care provider if you have any problems or questions after your procedure. °What can I expect after the procedure? °After your procedure, it is common: °· To feel sleepy for several hours. °· To feel clumsy and have poor balance for several hours. °· To have poor judgment for several hours. °· To vomit if you eat too soon. °Follow these instructions at home: °For at least 24 hours after the procedure: ° °· Do not: °? Participate in activities where you could fall or become injured. °? Drive. °? Use heavy machinery. °? Drink alcohol. °? Take sleeping pills or medicines that cause drowsiness. °? Make important decisions or sign legal documents. °? Take care of children on   your own. °· Rest. °Eating and drinking °· Follow the diet recommended by your health care provider. °· If you vomit: °? Drink water, juice, or soup when you can drink without vomiting. °? Make sure you have little or no nausea before eating solid foods. °General instructions °· Have a responsible adult stay with you until you are awake and alert. °· Take over-the-counter  and prescription medicines only as told by your health care provider. °· If you smoke, do not smoke without supervision. °· Keep all follow-up visits as told by your health care provider. This is important. °Contact a health care provider if: °· You keep feeling nauseous or you keep vomiting. °· You feel light-headed. °· You develop a rash. °· You have a fever. °Get help right away if: °· You have trouble breathing. °This information is not intended to replace advice given to you by your health care provider. Make sure you discuss any questions you have with your health care provider. °Document Released: 07/13/2013 Document Revised: 02/25/2016 Document Reviewed: 01/12/2016 °Elsevier Interactive Patient Education © 2019 [employer] ° °

## 2019-04-05 DIAGNOSIS — I129 Hypertensive chronic kidney disease with stage 1 through stage 4 chronic kidney disease, or unspecified chronic kidney disease: Secondary | ICD-10-CM | POA: Diagnosis not present

## 2019-04-06 DIAGNOSIS — R3129 Other microscopic hematuria: Secondary | ICD-10-CM | POA: Diagnosis not present

## 2019-04-06 DIAGNOSIS — I129 Hypertensive chronic kidney disease with stage 1 through stage 4 chronic kidney disease, or unspecified chronic kidney disease: Secondary | ICD-10-CM | POA: Diagnosis not present

## 2019-04-06 DIAGNOSIS — R809 Proteinuria, unspecified: Secondary | ICD-10-CM | POA: Diagnosis not present

## 2019-04-06 DIAGNOSIS — D509 Iron deficiency anemia, unspecified: Secondary | ICD-10-CM | POA: Diagnosis not present

## 2019-04-07 ENCOUNTER — Ambulatory Visit: Payer: BC Managed Care – PPO

## 2019-04-11 ENCOUNTER — Other Ambulatory Visit: Payer: Self-pay

## 2019-04-11 ENCOUNTER — Inpatient Hospital Stay: Payer: BC Managed Care – PPO | Attending: Hematology

## 2019-04-11 ENCOUNTER — Ambulatory Visit (HOSPITAL_COMMUNITY): Payer: BC Managed Care – PPO

## 2019-04-11 VITALS — BP 151/97 | HR 76 | Temp 97.1°F | Resp 18

## 2019-04-11 DIAGNOSIS — D509 Iron deficiency anemia, unspecified: Secondary | ICD-10-CM | POA: Insufficient documentation

## 2019-04-11 DIAGNOSIS — D649 Anemia, unspecified: Secondary | ICD-10-CM

## 2019-04-11 MED ORDER — SODIUM CHLORIDE 0.9 % IV SOLN
510.0000 mg | Freq: Once | INTRAVENOUS | Status: AC
  Administered 2019-04-11: 510 mg via INTRAVENOUS
  Filled 2019-04-11: qty 17

## 2019-04-11 MED ORDER — SODIUM CHLORIDE 0.9 % IV SOLN
Freq: Once | INTRAVENOUS | Status: AC
  Administered 2019-04-11: 10:00:00 via INTRAVENOUS
  Filled 2019-04-11: qty 250

## 2019-04-18 ENCOUNTER — Other Ambulatory Visit: Payer: Self-pay

## 2019-04-18 ENCOUNTER — Inpatient Hospital Stay: Payer: BC Managed Care – PPO

## 2019-04-18 VITALS — BP 139/79 | HR 77 | Temp 98.6°F | Resp 18

## 2019-04-18 DIAGNOSIS — D649 Anemia, unspecified: Secondary | ICD-10-CM

## 2019-04-18 DIAGNOSIS — D509 Iron deficiency anemia, unspecified: Secondary | ICD-10-CM | POA: Diagnosis not present

## 2019-04-18 MED ORDER — SODIUM CHLORIDE 0.9 % IV SOLN
510.0000 mg | Freq: Once | INTRAVENOUS | Status: AC
  Administered 2019-04-18: 510 mg via INTRAVENOUS
  Filled 2019-04-18: qty 510

## 2019-04-18 MED ORDER — SODIUM CHLORIDE 0.9 % IV SOLN
Freq: Once | INTRAVENOUS | Status: AC
  Administered 2019-04-18: 15:00:00 via INTRAVENOUS
  Filled 2019-04-18: qty 250

## 2019-04-18 NOTE — Patient Instructions (Signed)

## 2019-04-20 ENCOUNTER — Encounter (HOSPITAL_COMMUNITY): Payer: Self-pay

## 2019-04-21 DIAGNOSIS — I129 Hypertensive chronic kidney disease with stage 1 through stage 4 chronic kidney disease, or unspecified chronic kidney disease: Secondary | ICD-10-CM | POA: Diagnosis not present

## 2019-04-25 DIAGNOSIS — D509 Iron deficiency anemia, unspecified: Secondary | ICD-10-CM | POA: Diagnosis not present

## 2019-04-25 DIAGNOSIS — N051 Unspecified nephritic syndrome with focal and segmental glomerular lesions: Secondary | ICD-10-CM | POA: Diagnosis not present

## 2019-04-25 DIAGNOSIS — R809 Proteinuria, unspecified: Secondary | ICD-10-CM | POA: Diagnosis not present

## 2019-04-25 DIAGNOSIS — R3129 Other microscopic hematuria: Secondary | ICD-10-CM | POA: Diagnosis not present

## 2019-04-25 DIAGNOSIS — I129 Hypertensive chronic kidney disease with stage 1 through stage 4 chronic kidney disease, or unspecified chronic kidney disease: Secondary | ICD-10-CM | POA: Diagnosis not present

## 2019-05-03 ENCOUNTER — Ambulatory Visit (INDEPENDENT_AMBULATORY_CARE_PROVIDER_SITE_OTHER): Payer: BC Managed Care – PPO | Admitting: Family

## 2019-05-03 ENCOUNTER — Other Ambulatory Visit: Payer: Self-pay

## 2019-05-03 ENCOUNTER — Encounter: Payer: Self-pay | Admitting: Family

## 2019-05-03 VITALS — BP 138/85 | HR 78 | Temp 98.2°F | Resp 16 | Ht 66.0 in | Wt 268.0 lb

## 2019-05-03 DIAGNOSIS — I2699 Other pulmonary embolism without acute cor pulmonale: Secondary | ICD-10-CM | POA: Diagnosis not present

## 2019-05-03 DIAGNOSIS — N051 Unspecified nephritic syndrome with focal and segmental glomerular lesions: Secondary | ICD-10-CM | POA: Diagnosis not present

## 2019-05-03 DIAGNOSIS — I10 Essential (primary) hypertension: Secondary | ICD-10-CM | POA: Diagnosis not present

## 2019-05-03 MED ORDER — FUROSEMIDE 20 MG PO TABS: 40.0000 mg | ORAL_TABLET | Freq: Two times a day (BID) | ORAL | 0 refills | Status: DC

## 2019-05-03 NOTE — Progress Notes (Signed)
Subjective:    Patient ID: Tiffany Hubbard, female    DOB: May 06, 1976, 43 y.o.   MRN: [id number]  HPI  Patient is a 43 yr old female who presents today for follow up.  PE-  Continues xarelto, denies blood in urine or stool.    Proteinuria-Patient had kidney biopsy which revealed: Focal and segmental glomerulosclerosis. She is being treated with steroids to hopefully put in remission. She has been on steroids x 2 weeks.  She will see them again on 8/18. She has a glucometer provided by her nephrologist and reports highest sugar reading so far has been 120. She is currently on furosemide 40mg  bid and reports that her max weight was 290. She feels like she has eliminated a great deal of fluid. Wt Readings from Last 3 Encounters:  05/03/19 268 lb (121.6 kg)  04/01/19 270 lb (122.5 kg)  03/25/19 274 lb 6.4 oz (124.5 kg)   Reports sugars 120.     Review of Systems See HPI  Past Medical History:  Diagnosis Date  . Asthma   . Bronchitis   . Pulmonary emboli [facility])      Social History   Socioeconomic History  . Marital status: Married    Spouse name: Not on file  . Number of children: Not on file  . Years of education: Not on file  . Highest education level: Not on file  Occupational History  . 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 Financial resource strain: Not on file  . Food insecurity    Worry: Not on file    Inability: Not on file  . Transportation needs    Medical: Not on file    Non-medical: Not on file  Tobacco Use  . Smoking status: Never Smoker  . Smokeless tobacco: Never Used  Substance and Sexual Activity  . Alcohol use: Yes    Comment: occasionally  . Drug use: No  . Sexual activity: Yes    Partners: Male    Birth control/protection: Surgical  Lifestyle  . Physical activity    Days per week: Not on file    Minutes per session: Not on file  . Stress: Not on file  Relationships  . Social Clinical research associate on phone: Not on file    Gets together: Not on file    Attends religious service: Not on file    Active member of club or organization: Not on file    Attends meetings of clubs or organizations: Not on file    Relationship status: Not on file  . Intimate partner violence    Fear of current or ex partner: Not on file    Emotionally abused: Not on file    Physically abused: Not on file    Forced sexual activity: Not on file  Other Topics Concern  . Not on file  Social History Narrative   No children   Step daughter- does not live with patient   Married   1 dog shitzu   Enjoys movies, books, travelling   Completed bachelors degree    Past Surgical History:  Procedure Laterality Date  . ECTOPIC PREGNANCY SURGERY      Family History  Problem Relation Age of Onset  . Lung cancer Mother   . Breast cancer Mother 69  . Diabetes Mother   . Heart disease Father 31  . Hypertension Father   . Diabetes Maternal Grandmother   .  Stomach cancer Maternal Grandmother   . Arthritis Maternal Grandfather   . Colon cancer Maternal Aunt        2 aunts  . Colon cancer Maternal Uncle     No Known Allergies  Current Outpatient Medications on File Prior to Visit  Medication Sig Dispense Refill  . acetaminophen (TYLENOL) 500 MG tablet Take 1,000 mg by mouth every 6 (six) hours as needed for moderate pain.     Marland Kitchen atorvastatin (LIPITOR) 20 MG tablet TAKE 1 TABLET BY MOUTH EVERYDAY AT BEDTIME    . fluticasone (FLONASE) 50 MCG/ACT nasal spray Place 2 sprays into both nostrils daily. (Patient taking differently: Place 2 sprays into both nostrils daily as needed for allergies. ) 16 g 6  . furosemide (LASIX) 20 MG tablet Take 1 tablet (20 mg total) by mouth daily. 3 tablet 0  . lisinopril (ZESTRIL) 10 MG tablet Take 10 mg by mouth daily.     . metoprolol tartrate (LOPRESSOR) 25 MG tablet Take 25 mg by mouth 2 (two) times a day.    . pantoprazole (PROTONIX) 40 MG tablet Take 40 mg by mouth daily.     . predniSONE (DELTASONE) 20 MG tablet Take 60 mg by mouth daily.    . rivaroxaban (XARELTO) 20 MG TABS tablet Take 1 tablet (20 mg total) by mouth daily with supper. (Patient taking differently: Take 20 mg by mouth daily. ) 30 tablet 1  . sulfamethoxazole-trimethoprim (BACTRIM DS) 800-160 MG tablet TAKE 1 (ONE) TABLET BY MOUTH THREE TIMES A WEEK    . triamcinolone (KENALOG) 0.025 % cream Apply 1 application topically 2 (two) times daily as needed (eczema). 30 g 1   No current facility-administered medications on file prior to visit.     BP (!) 144/84 (BP Location: Right Arm, Patient Position: Sitting, Cuff Size: Large)   Pulse 78   Temp 98.2 F (36.8 C) (Oral)   Resp 16   Ht 5\' 6"  (1.676 m)   Wt 268 lb (121.6 kg)   SpO2 100%   BMI 43.26 kg/m       Objective:   Physical Exam Constitutional:      Appearance: She is well-developed.  Neck:     Musculoskeletal: Neck supple.     Thyroid: No thyromegaly.  Cardiovascular:     Rate and Rhythm: Normal rate and regular rhythm.     Heart sounds: Normal heart sounds. No murmur.  Pulmonary:     Effort: Pulmonary effort is normal. No respiratory distress.     Breath sounds: Normal breath sounds. No wheezing.  Skin:    General: Skin is warm and dry.  Neurological:     Mental Status: She is alert and oriented to person, place, and time.  Psychiatric:        Behavior: Behavior normal.        Thought Content: Thought content normal.        Judgment: Judgment normal.           Assessment & Plan:  Focal and Segmental Glomerulonephritis- this is being treated with steroids by nephrology.  We discussed need to continue to monitor sugars for hyperglycemia.   PE- likely precipitated by low protein levels secondary to proteinuria from above.   HTN- Bp is acceptable. Continue current meds.

## 2019-05-13 DIAGNOSIS — I129 Hypertensive chronic kidney disease with stage 1 through stage 4 chronic kidney disease, or unspecified chronic kidney disease: Secondary | ICD-10-CM | POA: Diagnosis not present

## 2019-05-13 DIAGNOSIS — N051 Unspecified nephritic syndrome with focal and segmental glomerular lesions: Secondary | ICD-10-CM | POA: Diagnosis not present

## 2019-05-13 DIAGNOSIS — Z79899 Other long term (current) drug therapy: Secondary | ICD-10-CM | POA: Diagnosis not present

## 2019-05-19 ENCOUNTER — Telehealth: Payer: Self-pay | Admitting: Family

## 2019-05-19 DIAGNOSIS — I129 Hypertensive chronic kidney disease with stage 1 through stage 4 chronic kidney disease, or unspecified chronic kidney disease: Secondary | ICD-10-CM | POA: Diagnosis not present

## 2019-05-19 DIAGNOSIS — N051 Unspecified nephritic syndrome with focal and segmental glomerular lesions: Secondary | ICD-10-CM | POA: Diagnosis not present

## 2019-05-19 DIAGNOSIS — D509 Iron deficiency anemia, unspecified: Secondary | ICD-10-CM | POA: Diagnosis not present

## 2019-05-19 DIAGNOSIS — E8770 Fluid overload, unspecified: Secondary | ICD-10-CM | POA: Diagnosis not present

## 2019-05-19 NOTE — Telephone Encounter (Signed)
Medication Refill - Medication: rivaroxaban (XARELTO) 20 MG TABS tablet    Has the patient contacted their pharmacy? Yes (Agent: If no, request that the patient contact the pharmacy for the refill.) (Agent: If yes, when and what did the pharmacy advise?)Contact PCP  Preferred Pharmacy (with phone number or street name): [id number] - [place], [place] - [address] [phone] (Phone) [phone] (Fax)     Agent: Please be advised that RX refills may take up to 3 business days. We ask that you follow-up with your pharmacy.

## 2019-05-20 MED ORDER — RIVAROXABAN 20 MG PO TABS: 20.0000 mg | ORAL_TABLET | Freq: Every day | ORAL | 5 refills | Status: DC

## 2019-05-20 NOTE — Telephone Encounter (Signed)
Yes, she should continue xarelto. Refills sent. She should keep upcoming appointment in September with Dr. Maylon Peppers (Hematology).

## 2019-05-20 NOTE — Telephone Encounter (Signed)
Is patient supposed to continue this medication?

## 2019-05-24 NOTE — Telephone Encounter (Signed)
lvm for patient to be aware to continue medication and to keep appt.

## 2019-06-16 DIAGNOSIS — N051 Unspecified nephritic syndrome with focal and segmental glomerular lesions: Secondary | ICD-10-CM | POA: Diagnosis not present

## 2019-06-20 DIAGNOSIS — D509 Iron deficiency anemia, unspecified: Secondary | ICD-10-CM | POA: Diagnosis not present

## 2019-06-20 DIAGNOSIS — E8770 Fluid overload, unspecified: Secondary | ICD-10-CM | POA: Diagnosis not present

## 2019-06-20 DIAGNOSIS — N051 Unspecified nephritic syndrome with focal and segmental glomerular lesions: Secondary | ICD-10-CM | POA: Diagnosis not present

## 2019-06-20 DIAGNOSIS — I129 Hypertensive chronic kidney disease with stage 1 through stage 4 chronic kidney disease, or unspecified chronic kidney disease: Secondary | ICD-10-CM | POA: Diagnosis not present

## 2019-06-22 ENCOUNTER — Other Ambulatory Visit: Payer: Self-pay | Admitting: Hematology

## 2019-06-22 DIAGNOSIS — I2699 Other pulmonary embolism without acute cor pulmonale: Secondary | ICD-10-CM

## 2019-06-22 NOTE — Progress Notes (Signed)
[facility] OFFICE PROGRESS NOTE  Patient Care Team: Debbrah Alar, NP as PCP - General (Internal Medicine)  HEME/ONC OVERVIEW: 1. Acute unprovoked PTE of LUL -02/2019: CTA chest showed acute PTE involving a segmental branch of LUL, with possible smaller subsegmental PTE involving LLL; no DVT in the lower extremities  On Xarelto 13m daily   2. Incidental left adrenal nodule -Noted incidentally on CT, 2.5cm in 02/2019   TREATMENT REGIMEN:  02/2019 - present: Xarelto 231mdaily   PRN IV iron, last in 04/2019   ASSESSMENT & PLAN:   Unprovoked PTE of LUL -Patient is tolerating Xarelto 2021maily well -In light of her young age and unprovoked PTE, I have ordered hypercoaguable work-up, including prothrombin gene mutation, Factor V Leiden, and antiphospholipid syndrome  -Once she completes at least 6 months of anticoagulation (ie 08/2019), if she remains asymptomatic without any evidence of recurrent VTE, we can consider reducing her Xarelto dose to 21m81mily for secondary ppx  -I reinforced the importance of preventive strategies such as avoiding hormonal supplement, avoiding cigarette smoking, keeping up-to-date with screening programs for early cancer detection, frequent ambulation for long distance travel and aggressive DVT prophylaxis in all surgical settings. -Should she need any interruption of the anticoagulation for elective procedures in the future, feel free to contact me regarding peri-operative management.  Leukocytosis  -WBC 12.5k today, new -Likely due to steroid -Patient denies any symptoms infection -We will monitor it for now   Thrombocytosis -Likely reactive in the setting of recent iron deficiency and FSGS  -Plts 433k, stable  -We will monitor it for now   Menorrhagia -Overall stable; patient has gynecology appt on 06/28/2019 -I counseled the patient on the importance of addressing the menorrhagia, as it can lead to recurrent iron  deficiency  -I also recommended the patient to take daily oral iron supplement and counseled her the potential side effects, including constipation   FSGS  -Renal bx in 03/2019 confirmed FSGS without evidence of immune deposits -Review of UPEP and 24 hour urine studies were negative for any monoclonal protein  -Per nephrology request, I have ordered SPEP w/ IFE and free light chains to rule out any monoclonal protein  -Followed by nephrology  Orders Placed This Encounter  Procedures  . Multiple Myeloma Panel (SPEP&IFE w/QIG)    Standing Status:   Future    Standing Expiration Date:   07/28/2020  . Kappa/lambda light chains    Standing Status:   Future    Standing Expiration Date:   12/21/2020  . CBC with Differential (Cancer Center Only)    Standing Status:   Future    Standing Expiration Date:   07/28/2020  . CMP ([facility])    Standing Status:   Future    Standing Expiration Date:   07/28/2020    All questions were answered. The patient knows to call the clinic with any problems, questions or concerns. No barriers to learning was detected.  Return in 2 months for labs and clinic follow-up.  Tiffany Hubbard [name] 06/24/2019 10:43 AM  CHIEF COMPLAINT: "I am doing much better"  INTERVAL HISTORY: Mr. TaylLovena Leurns to clinic for follow-up of unprovoked PTE on Xarelto.  Patient reports that since starting high-dose steroid, normal extremity swelling has improved significantly.  She is tolerating Xarelto well without any abnormal bleeding.  She denies chest pain, dyspnea, abdominal pain, nausea, vomiting, diarrhea.  She still has mild to moderate persistent menorrhagia, and is scheduled to see gynecology next  week.  Since IV iron infusion: Energy level has improved significantly.  REVIEW OF SYSTEMS:   Constitutional: ( - ) fevers, ( - )  chills , ( - ) night sweats Eyes: ( - ) blurriness of vision, ( - ) double vision, ( - ) watery eyes Ears, nose, mouth, throat, and face: ( - )  mucositis, ( - ) sore throat Respiratory: ( - ) cough, ( - ) dyspnea, ( - ) wheezes Cardiovascular: ( - ) palpitation, ( - ) chest discomfort, ( + ) lower extremity swelling Gastrointestinal:  ( - ) nausea, ( - ) heartburn, ( - ) change in bowel habits Skin: ( - ) abnormal skin rashes Lymphatics: ( - ) new lymphadenopathy, ( - ) easy bruising Neurological: ( - ) numbness, ( - ) tingling, ( - ) new weaknesses Behavioral/Psych: ( - ) mood change, ( - ) new changes  All other systems were reviewed with the patient and are negative.  I have reviewed the past medical history, past surgical history, social history and family history with the patient and they are unchanged from previous note.  ALLERGIES:  has No Known Allergies.  MEDICATIONS:  Current Outpatient Medications  Medication Sig Dispense Refill  . acetaminophen (TYLENOL) 500 MG tablet Take 1,000 mg by mouth every 6 (six) hours as needed for moderate pain.     Marland Kitchen atorvastatin (LIPITOR) 20 MG tablet TAKE 1 TABLET BY MOUTH EVERYDAY AT BEDTIME    . fluticasone (FLONASE) 50 MCG/ACT nasal spray Place 2 sprays into both nostrils daily. (Patient taking differently: Place 2 sprays into both nostrils daily as needed for allergies. ) 16 g 6  . furosemide (LASIX) 20 MG tablet Take 2 tablets (40 mg total) by mouth 2 (two) times daily. 3 tablet 0  . lisinopril (ZESTRIL) 10 MG tablet Take 10 mg by mouth daily.     . metoprolol tartrate (LOPRESSOR) 25 MG tablet Take 25 mg by mouth 2 (two) times a day.    . predniSONE (DELTASONE) 20 MG tablet Take 60 mg by mouth daily.    . rivaroxaban (XARELTO) 20 MG TABS tablet Take 1 tablet (20 mg total) by mouth daily with supper. 30 tablet 5  . sulfamethoxazole-trimethoprim (BACTRIM DS) 800-160 MG tablet TAKE 1 (ONE) TABLET BY MOUTH THREE TIMES A WEEK    . triamcinolone (KENALOG) 0.025 % cream Apply 1 application topically 2 (two) times daily as needed (eczema). 30 g 1   No current facility-administered  medications for this visit.     PHYSICAL EXAMINATION: ECOG PERFORMANCE STATUS: 1 - Symptomatic but completely ambulatory  Today's Vitals   06/24/19 1000  BP: (!) 146/94  Pulse: 79  Resp: 18  Temp: 98.5 F (36.9 C)  TempSrc: Temporal  Weight: 271 lb (122.9 kg)  Height: 5' 6"  (1.676 m)   Body mass index is 43.74 kg/m.  Filed Weights   06/24/19 1000  Weight: 271 lb (122.9 kg)    GENERAL: alert, no distress and comfortable SKIN: skin color, texture, turgor are normal, no rashes or significant lesions EYES: conjunctiva are pink and non-injected, sclera clear OROPHARYNX: no exudate, no erythema; lips, buccal mucosa, and tongue normal  NECK: supple, non-tender LUNGS: clear to auscultation with normal breathing effort HEART: regular rate & rhythm and no murmurs and 1+ bilateral lower extremity edema ABDOMEN: soft, non-tender, non-distended, normal bowel sounds Musculoskeletal: no cyanosis of digits and no clubbing  PSYCH: alert & oriented x 3, fluent speech NEURO: no focal motor/sensory deficits  LABORATORY DATA:  I have reviewed the data as listed    Component Value Date/Time   NA 141 06/24/2019 0957   K 3.8 06/24/2019 0957   CL 107 06/24/2019 0957   CO2 28 06/24/2019 0957   GLUCOSE 96 06/24/2019 0957   BUN 13 06/24/2019 0957   CREATININE 0.71 06/24/2019 0957   CALCIUM 8.7 (L) 06/24/2019 0957   PROT 5.6 (L) 06/24/2019 0957   ALBUMIN 3.2 (L) 06/24/2019 0957   AST 11 (L) 06/24/2019 0957   ALT 20 06/24/2019 0957   ALKPHOS 33 (L) 06/24/2019 0957   BILITOT 0.2 (L) 06/24/2019 0957   GFRNONAA >60 06/24/2019 0957   GFRAA >60 06/24/2019 0957    No results found for: SPEP, UPEP  Lab Results  Component Value Date   WBC 12.5 (H) 06/24/2019   NEUTROABS 7.9 (H) 06/24/2019   HGB 12.4 06/24/2019   HCT 38.9 06/24/2019   MCV 88.2 06/24/2019   PLT 433 (H) 06/24/2019      Chemistry      Component Value Date/Time   NA 141 06/24/2019 0957   K 3.8 06/24/2019 0957   CL 107  06/24/2019 0957   CO2 28 06/24/2019 0957   BUN 13 06/24/2019 0957   CREATININE 0.71 06/24/2019 0957      Component Value Date/Time   CALCIUM 8.7 (L) 06/24/2019 0957   ALKPHOS 33 (L) 06/24/2019 0957   AST 11 (L) 06/24/2019 0957   ALT 20 06/24/2019 0957   BILITOT 0.2 (L) 06/24/2019 0957       RADIOGRAPHIC STUDIES: I have personally reviewed the radiological images as listed below and agreed with the findings in the report. No results found.

## 2019-06-24 ENCOUNTER — Inpatient Hospital Stay: Payer: BC Managed Care – PPO | Attending: Hematology | Admitting: Hematology

## 2019-06-24 ENCOUNTER — Inpatient Hospital Stay: Payer: BC Managed Care – PPO

## 2019-06-24 ENCOUNTER — Other Ambulatory Visit: Payer: Self-pay

## 2019-06-24 ENCOUNTER — Encounter: Payer: Self-pay | Admitting: Hematology

## 2019-06-24 VITALS — BP 146/94 | HR 79 | Temp 98.5°F | Resp 18 | Ht 66.0 in | Wt 271.0 lb

## 2019-06-24 DIAGNOSIS — D72825 Bandemia: Secondary | ICD-10-CM

## 2019-06-24 DIAGNOSIS — Z7901 Long term (current) use of anticoagulants: Secondary | ICD-10-CM | POA: Diagnosis not present

## 2019-06-24 DIAGNOSIS — N924 Excessive bleeding in the premenopausal period: Secondary | ICD-10-CM | POA: Diagnosis not present

## 2019-06-24 DIAGNOSIS — D75839 Thrombocytosis, unspecified: Secondary | ICD-10-CM

## 2019-06-24 DIAGNOSIS — I2699 Other pulmonary embolism without acute cor pulmonale: Secondary | ICD-10-CM | POA: Diagnosis not present

## 2019-06-24 DIAGNOSIS — Z79899 Other long term (current) drug therapy: Secondary | ICD-10-CM | POA: Insufficient documentation

## 2019-06-24 DIAGNOSIS — N920 Excessive and frequent menstruation with regular cycle: Secondary | ICD-10-CM | POA: Insufficient documentation

## 2019-06-24 DIAGNOSIS — N051 Unspecified nephritic syndrome with focal and segmental glomerular lesions: Secondary | ICD-10-CM | POA: Diagnosis not present

## 2019-06-24 DIAGNOSIS — D649 Anemia, unspecified: Secondary | ICD-10-CM

## 2019-06-24 DIAGNOSIS — D473 Essential (hemorrhagic) thrombocythemia: Secondary | ICD-10-CM | POA: Diagnosis not present

## 2019-06-24 DIAGNOSIS — R7989 Other specified abnormal findings of blood chemistry: Secondary | ICD-10-CM | POA: Insufficient documentation

## 2019-06-24 DIAGNOSIS — D72829 Elevated white blood cell count, unspecified: Secondary | ICD-10-CM | POA: Diagnosis not present

## 2019-06-24 LAB — CBC WITH DIFFERENTIAL (CANCER CENTER ONLY)
Abs Immature Granulocytes: 0.18 10*3/uL — ABNORMAL HIGH (ref 0.00–0.07)
Basophils Absolute: 0 10*3/uL (ref 0.0–0.1)
Basophils Relative: 0 %
Eosinophils Absolute: 0.1 10*3/uL (ref 0.0–0.5)
Eosinophils Relative: 1 %
HCT: 38.9 % (ref 36.0–46.0)
Hemoglobin: 12.4 g/dL (ref 12.0–15.0)
Immature Granulocytes: 1 %
Lymphocytes Relative: 29 %
Lymphs Abs: 3.6 10*3/uL (ref 0.7–4.0)
MCH: 28.1 pg (ref 26.0–34.0)
MCHC: 31.9 g/dL (ref 30.0–36.0)
MCV: 88.2 fL (ref 80.0–100.0)
Monocytes Absolute: 0.7 10*3/uL (ref 0.1–1.0)
Monocytes Relative: 5 %
Neutro Abs: 7.9 10*3/uL — ABNORMAL HIGH (ref 1.7–7.7)
Neutrophils Relative %: 64 %
Platelet Count: 433 10*3/uL — ABNORMAL HIGH (ref 150–400)
RBC: 4.41 MIL/uL (ref 3.87–5.11)
RDW: 14.3 % (ref 11.5–15.5)
WBC Count: 12.5 10*3/uL — ABNORMAL HIGH (ref 4.0–10.5)
nRBC: 0 % (ref 0.0–0.2)

## 2019-06-24 LAB — CMP (CANCER CENTER ONLY)
ALT: 20 U/L (ref 0–44)
AST: 11 U/L — ABNORMAL LOW (ref 15–41)
Albumin: 3.2 g/dL — ABNORMAL LOW (ref 3.5–5.0)
Alkaline Phosphatase: 33 U/L — ABNORMAL LOW (ref 38–126)
Anion gap: 6 (ref 5–15)
BUN: 13 mg/dL (ref 6–20)
CO2: 28 mmol/L (ref 22–32)
Calcium: 8.7 mg/dL — ABNORMAL LOW (ref 8.9–10.3)
Chloride: 107 mmol/L (ref 98–111)
Creatinine: 0.71 mg/dL (ref 0.44–1.00)
GFR, Est AFR Am: >60 mL/min (ref >60)
GFR, Estimated: >60 mL/min (ref >60)
Glucose, Bld: 96 mg/dL (ref 70–99)
Potassium: 3.8 mmol/L (ref 3.5–5.1)
Sodium: 141 mmol/L (ref 135–145)
Total Bilirubin: 0.2 mg/dL — ABNORMAL LOW (ref 0.3–1.2)
Total Protein: 5.6 g/dL — ABNORMAL LOW (ref 6.5–8.1)

## 2019-06-24 LAB — IRON AND TIBC
Iron: 43 ug/dL (ref 41–142)
Saturation Ratios: 15 % — ABNORMAL LOW (ref 21–57)
TIBC: 278 ug/dL (ref 236–444)
UIBC: 235 ug/dL (ref 120–384)

## 2019-06-24 LAB — FERRITIN: Ferritin: 19 ng/mL (ref 11–307)

## 2019-06-24 LAB — SAVE SMEAR(SSMR), FOR PROVIDER SLIDE REVIEW

## 2019-06-26 LAB — BETA-2-GLYCOPROTEIN I ABS, IGG/M/A
Beta-2 Glyco I IgG: <9 GPI IgG units (ref 0–20)
Beta-2-Glycoprotein I IgA: <9 GPI IgA units (ref 0–25)
Beta-2-Glycoprotein I IgM: <9 GPI IgM units (ref 0–32)

## 2019-06-27 LAB — LUPUS ANTICOAGULANT PANEL
DRVVT: 50.6 s — ABNORMAL HIGH (ref 0.0–47.0)
PTT Lupus Anticoagulant: 32 s (ref 0.0–51.9)

## 2019-06-27 LAB — DRVVT MIX: dRVVT Mix: 41.6 s (ref 0.0–47.0)

## 2019-06-28 LAB — CARDIOLIPIN ANTIBODIES, IGG, IGM, IGA
Anticardiolipin IgA: <9 APL U/mL (ref 0–11)
Anticardiolipin IgG: <9 GPL U/mL (ref 0–14)
Anticardiolipin IgM: 14 MPL U/mL — ABNORMAL HIGH (ref 0–12)

## 2019-06-30 LAB — FACTOR 5 LEIDEN

## 2019-06-30 LAB — PROTHROMBIN GENE MUTATION

## 2019-08-05 ENCOUNTER — Ambulatory Visit: Payer: BC Managed Care – PPO | Admitting: Family

## 2019-08-05 DIAGNOSIS — E785 Hyperlipidemia, unspecified: Secondary | ICD-10-CM | POA: Diagnosis not present

## 2019-08-05 DIAGNOSIS — I129 Hypertensive chronic kidney disease with stage 1 through stage 4 chronic kidney disease, or unspecified chronic kidney disease: Secondary | ICD-10-CM | POA: Diagnosis not present

## 2019-08-05 DIAGNOSIS — N051 Unspecified nephritic syndrome with focal and segmental glomerular lesions: Secondary | ICD-10-CM | POA: Diagnosis not present

## 2019-08-05 DIAGNOSIS — E8770 Fluid overload, unspecified: Secondary | ICD-10-CM | POA: Diagnosis not present

## 2019-08-08 ENCOUNTER — Other Ambulatory Visit: Payer: Self-pay

## 2019-08-08 ENCOUNTER — Ambulatory Visit (HOSPITAL_BASED_OUTPATIENT_CLINIC_OR_DEPARTMENT_OTHER)
Admission: RE | Admit: 2019-08-08 | Discharge: 2019-08-08 | Disposition: A | Discharge status: Home / Self Care | Payer: BC Managed Care – PPO | Source: Ambulatory Visit | Attending: Internal Medicine | Admitting: Internal Medicine

## 2019-08-08 ENCOUNTER — Encounter: Payer: Self-pay | Admitting: Internal Medicine

## 2019-08-08 ENCOUNTER — Ambulatory Visit (INDEPENDENT_AMBULATORY_CARE_PROVIDER_SITE_OTHER): Payer: BC Managed Care – PPO | Admitting: Internal Medicine

## 2019-08-08 VITALS — BP 141/60 | HR 79 | Temp 97.8°F | Resp 16 | Ht 66.0 in | Wt 270.0 lb

## 2019-08-08 DIAGNOSIS — S93401A Sprain of unspecified ligament of right ankle, initial encounter: Secondary | ICD-10-CM | POA: Diagnosis not present

## 2019-08-08 DIAGNOSIS — D72829 Elevated white blood cell count, unspecified: Secondary | ICD-10-CM | POA: Diagnosis not present

## 2019-08-08 DIAGNOSIS — E8770 Fluid overload, unspecified: Secondary | ICD-10-CM | POA: Diagnosis not present

## 2019-08-08 DIAGNOSIS — N051 Unspecified nephritic syndrome with focal and segmental glomerular lesions: Secondary | ICD-10-CM | POA: Diagnosis not present

## 2019-08-08 DIAGNOSIS — I129 Hypertensive chronic kidney disease with stage 1 through stage 4 chronic kidney disease, or unspecified chronic kidney disease: Secondary | ICD-10-CM | POA: Diagnosis not present

## 2019-08-08 DIAGNOSIS — D509 Iron deficiency anemia, unspecified: Secondary | ICD-10-CM | POA: Diagnosis not present

## 2019-08-08 DIAGNOSIS — S8251XA Displaced fracture of medial malleolus of right tibia, initial encounter for closed fracture: Secondary | ICD-10-CM

## 2019-08-08 IMAGING — DX DG ANKLE COMPLETE 3+V*R*
3 series · 3 of 3 positions shown · non-contrast
Comparison: None.

CLINICAL DATA: Ankle sprain

EXAM:
RIGHT ANKLE - COMPLETE 3+ VIEW

[ankle ap]
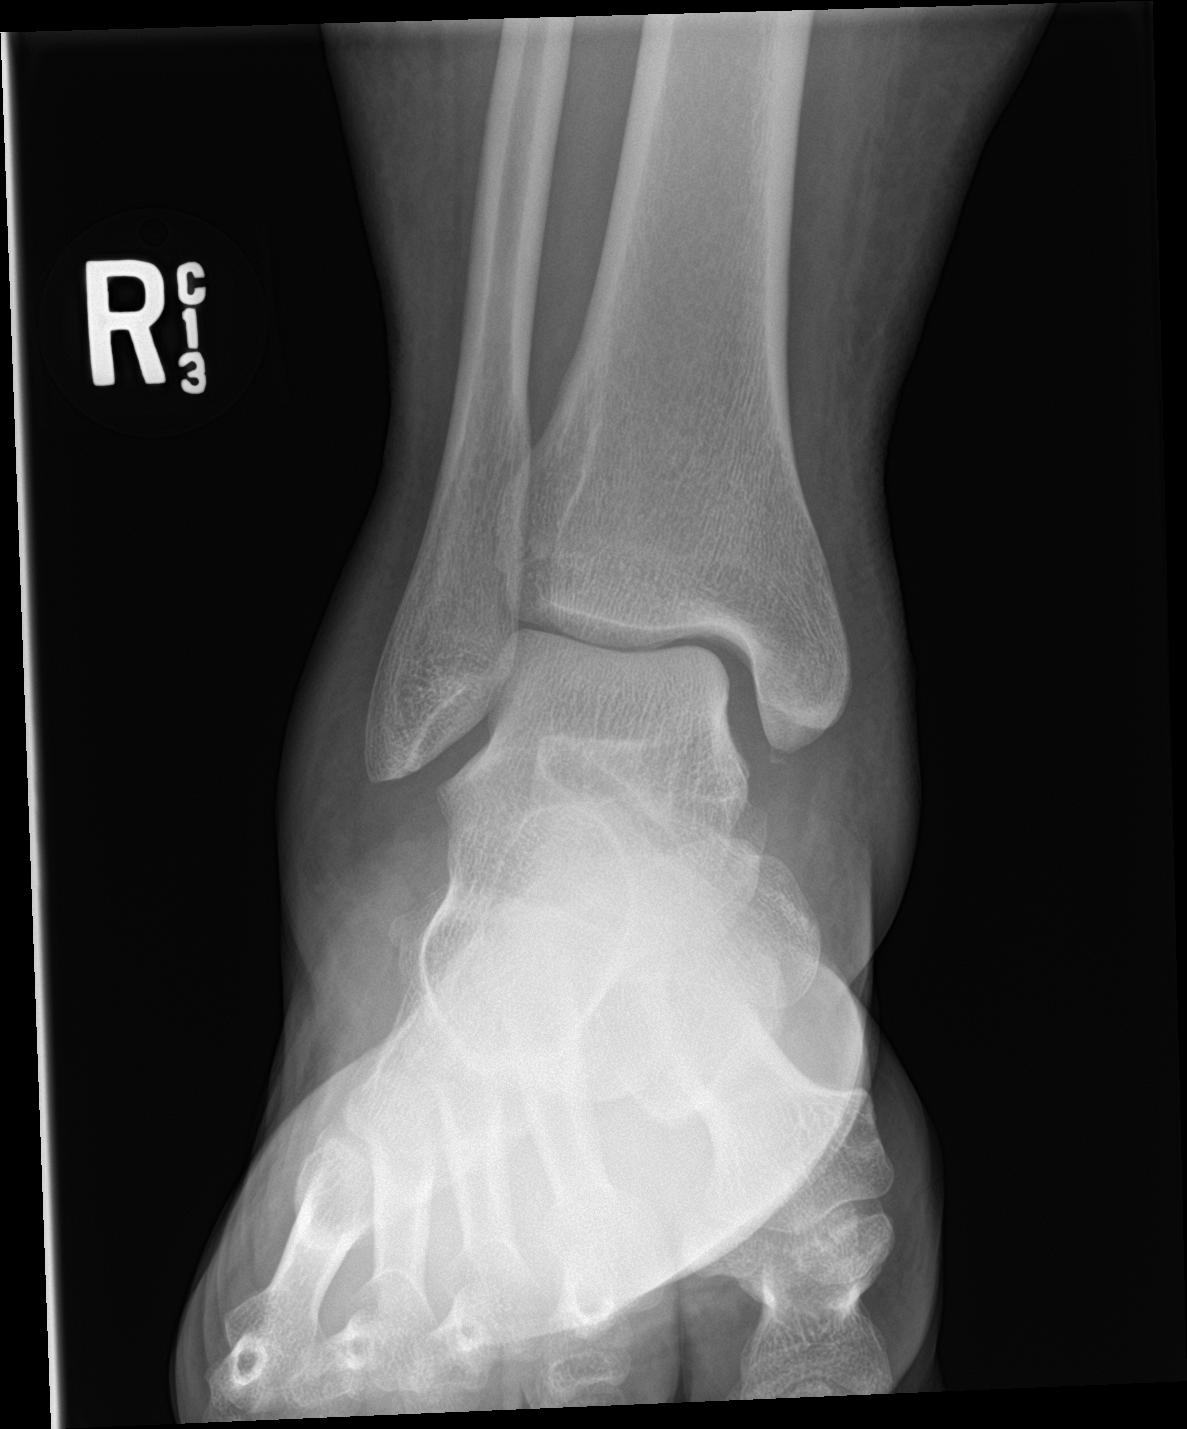

[ankle obl]
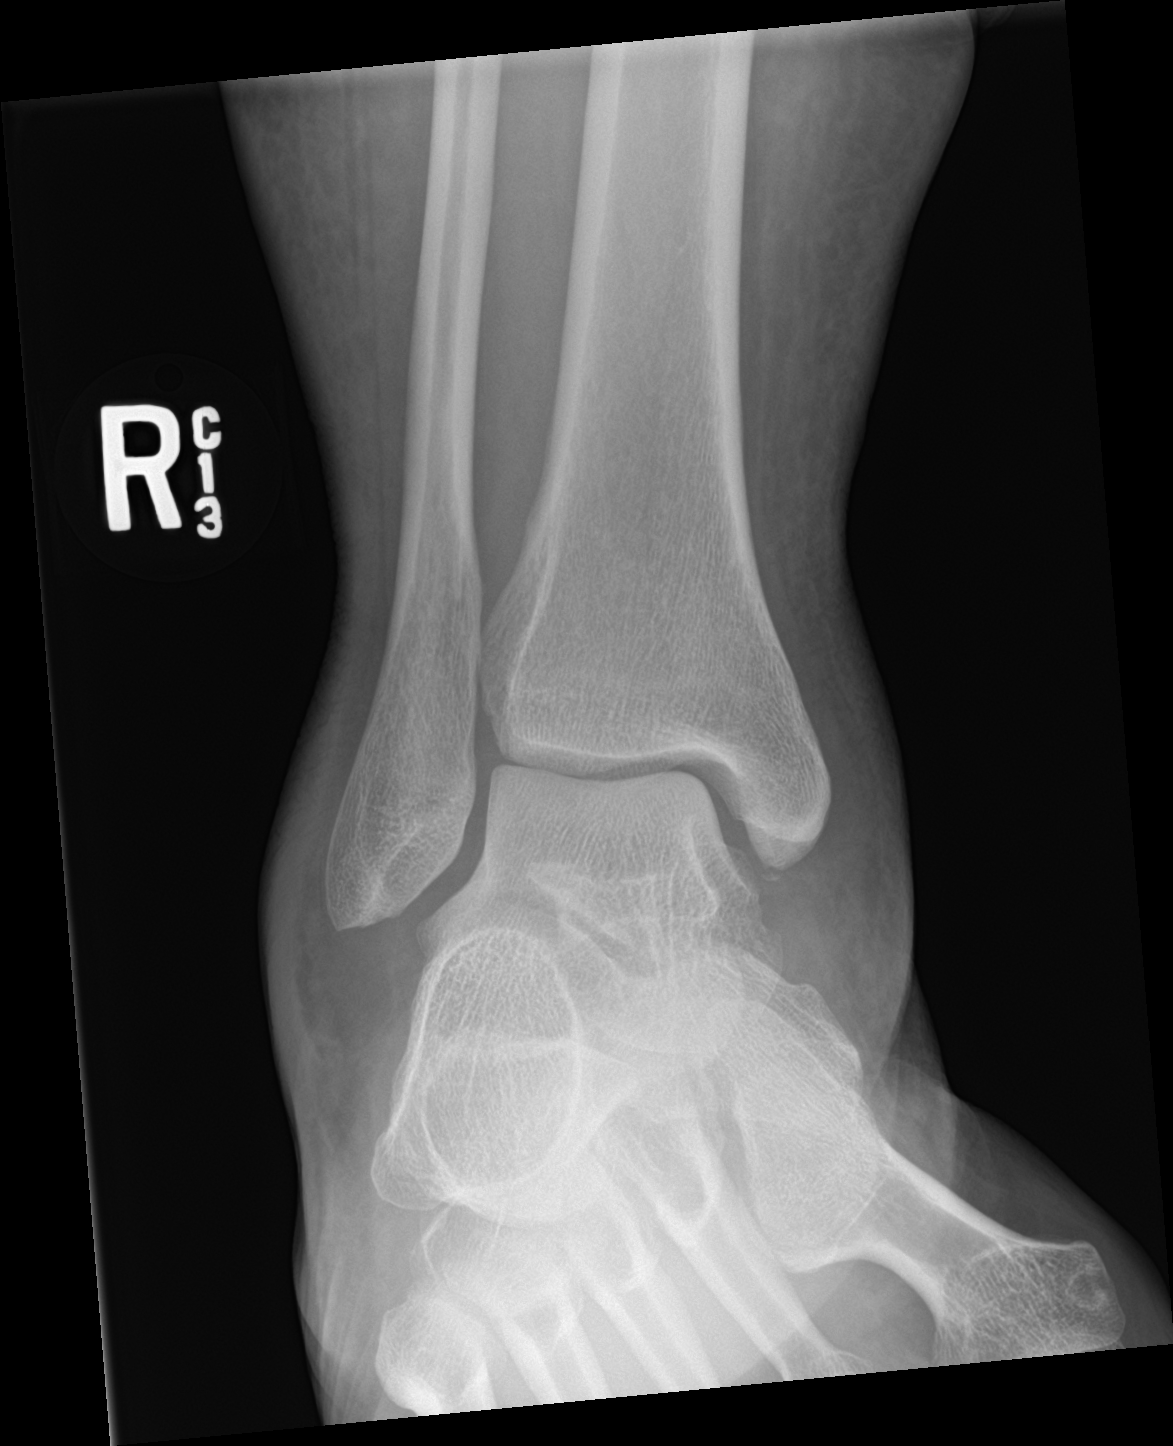

[ankle lat]
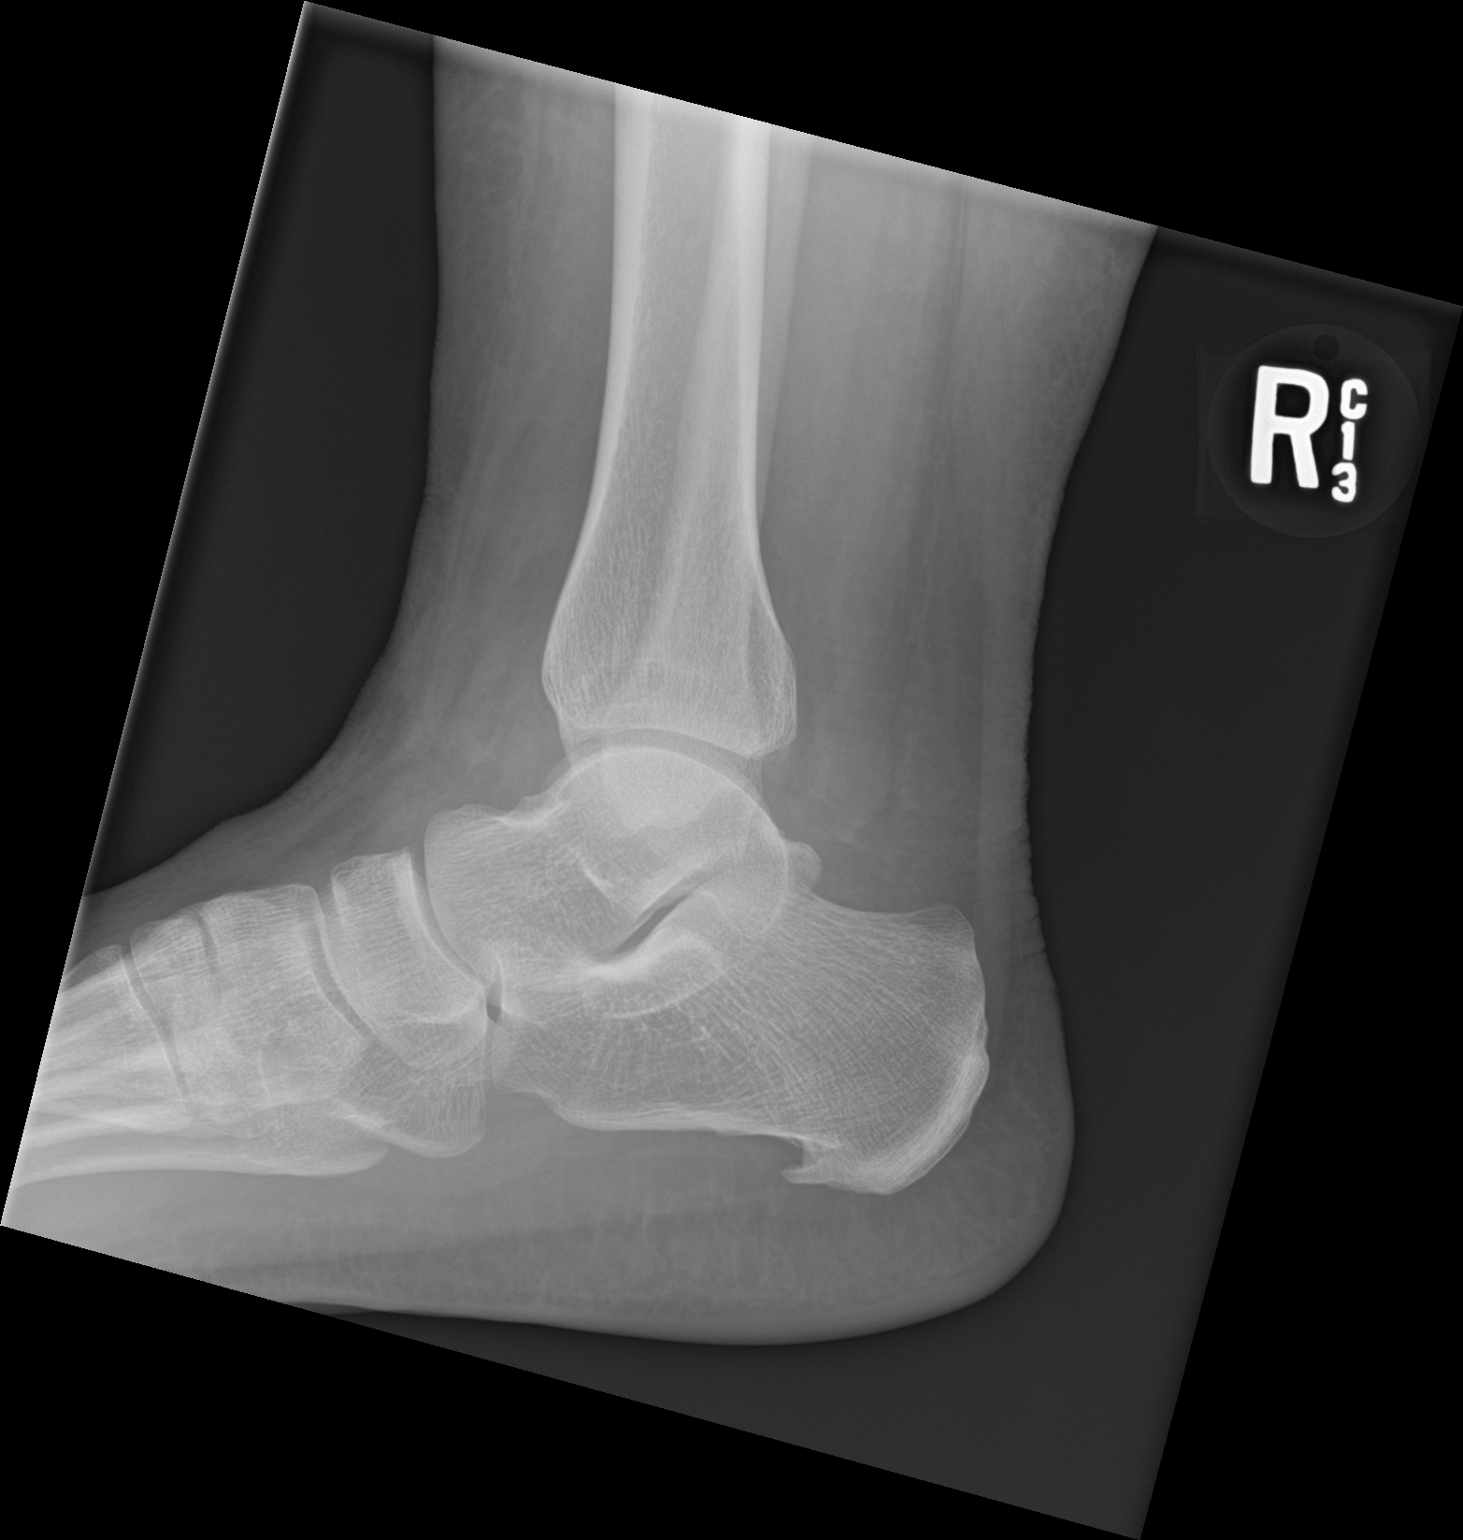

[3 of 3 positions shown; findings below may reference images not displayed]

FINDINGS: Moderate plantar calcaneal spur. Ankle mortise is symmetric. Tiny
avulsion injury off the tip of the medial malleolus.
IMPRESSION: Acute avulsion injury off the tip of the medial malleolus. Prominent
soft tissue swelling.

## 2019-08-08 NOTE — Progress Notes (Signed)
Subjective:    Patient ID: Tiffany Hubbard, female    DOB: 22-Dec-1975, 43 y.o.   MRN: [id number]  DOS:  08/08/2019 Type of visit - description: Acute 4 days ago, she was walking on the yard, she slept and fell forward. She denies any neck, face injuries.  No bleeding. In the process she twisted her right ankle. Shortly after she Ace wrapped and applied ice on and off. She is here because the pain has not improved.  She is controlling the swelling with the Ace wrap.  Review of Systems As above  Past Medical History:  Diagnosis Date  . Asthma   . Bronchitis   . Focal segmental glomerulosclerosis   . Pulmonary emboli [facility])     Past Surgical History:  Procedure Laterality Date  . ECTOPIC PREGNANCY SURGERY      Social History   Socioeconomic History  . Marital status: Married    Spouse name: Not on file  . Number of children: Not on file  . Years of education: Not on file  . Highest education level: Not on file  Occupational History  . 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 Financial resource strain: Not on file  . Food insecurity    Worry: Not on file    Inability: Not on file  . Transportation needs    Medical: Not on file    Non-medical: Not on file  Tobacco Use  . Smoking status: Never Smoker  . Smokeless tobacco: Never Used  Substance and Sexual Activity  . Alcohol use: Yes    Comment: occasionally  . Drug use: No  . Sexual activity: Yes    Partners: Male    Birth control/protection: Surgical  Lifestyle  . Physical activity    Days per week: Not on file    Minutes per session: Not on file  . Stress: Not on file  Relationships  . Social Herbalist on phone: Not on file    Gets together: Not on file    Attends religious service: Not on file    Active member of club or organization: Not on file    Attends meetings of clubs or organizations: Not on file    Relationship status: Not on file  .  Intimate partner violence    Fear of current or ex partner: Not on file    Emotionally abused: Not on file    Physically abused: Not on file    Forced sexual activity: Not on file  Other Topics Concern  . Not on file  Social History Narrative   No children   Step daughter- does not live with patient   Married   1 dog shitzu   Enjoys movies, books, travelling   Completed bachelors degree   Works for [employer] from home      Allergies as of 08/08/2019   No Known Allergies     Medication List       Accurate as of August 08, 2019 11:59 PM. If you have any questions, ask your nurse or doctor.        acetaminophen 500 MG tablet Commonly known as: TYLENOL Take 1,000 mg by mouth every 6 (six) hours as needed for moderate pain.   atorvastatin 20 MG tablet Commonly known as: LIPITOR TAKE 1 TABLET BY MOUTH EVERYDAY AT BEDTIME   fluticasone 50 MCG/ACT nasal spray Commonly known as: [place]  2 sprays into both nostrils daily. What changed:   when to take this  reasons to take this   furosemide 20 MG tablet Commonly known as: LASIX Take 2 tablets (40 mg total) by mouth 2 (two) times daily.   lisinopril 10 MG tablet Commonly known as: ZESTRIL Take 10 mg by mouth daily.   metFORMIN 500 MG tablet Commonly known as: GLUCOPHAGE   metoprolol tartrate 25 MG tablet Commonly known as: LOPRESSOR Take 25 mg by mouth 2 (two) times a day.   predniSONE 20 MG tablet Commonly known as: DELTASONE Take 60 mg by mouth daily.   rivaroxaban 20 MG Tabs tablet Commonly known as: XARELTO Take 1 tablet (20 mg total) by mouth daily with supper.   sulfamethoxazole-trimethoprim 800-160 MG tablet Commonly known as: BACTRIM DS TAKE 1 (ONE) TABLET BY MOUTH THREE TIMES A WEEK   triamcinolone 0.025 % cream Commonly known as: KENALOG Apply 1 application topically 2 (two) times daily as needed (eczema).           Objective:   Physical Exam BP (!) 141/60 (BP  Location: Left Arm, Patient Position: Sitting, Cuff Size: Large)   Pulse 79   Temp 97.8 F (36.6 C) (Temporal)   Resp 16   Ht 5\' 6"  (1.676 m)   Wt 270 lb (122.5 kg)   SpO2 100%   BMI 43.58 kg/m  General:   Well developed, NAD, BMI noted. HEENT:  Normocephalic . Face symmetric, atraumatic MSK: Left foot and ankle: Normal Right foot and ankle: Mild swelling noted, no redness, warmness. Good pedal pulse No ankle deformities, slightly TTP at the malleolar areas, mostly on the internal side. Range of motion passive and active: Good, slightly limited by pain. Skin: Not pale. Not jaundice Neurologic:  alert & oriented X3.  Speech normal, gait slightly limited by pain. Psych--  Cognition and judgment appear intact.  Cooperative with normal attention span and concentration.  Behavior appropriate. No anxious or depressed appearing.      Assessment    43 year old female, PMH includes a history of pulmonary emboli, currently on Xarelto. Also history of high cholesterol, HTN, diabetes, focal segmental glomerulosclerosis, presents with:  Right ankle sprain: As described above, will get x-ray. Otherwise recommend to continue Ace wrapping the area, Tylenol, ice, avoid NSAIDs. Ortho referral if there is a fracture or if she is not gradually improving. The right ankle was rewrapped today. Addendum: X-ray show an acute avulsion injury off the tip of the medial malleolus.  Refer to Ortho

## 2019-08-08 NOTE — Patient Instructions (Signed)
Go to the first floor, get an x-ray  Leg elevation  Ice the area twice a day  Okay to take Tylenol as needed for pain.  Do not take ibuprofen, naproxen or similar medications because you are anticoagulated.  If you are not gradually better please let us know

## 2019-08-08 NOTE — Progress Notes (Signed)
Pre visit review using our clinic review tool, if applicable. No additional management support is needed unless otherwise documented below in the visit note. 

## 2019-08-09 ENCOUNTER — Encounter: Payer: Self-pay | Admitting: Orthopedic Surgery

## 2019-08-09 ENCOUNTER — Ambulatory Visit: Payer: BC Managed Care – PPO | Admitting: Family

## 2019-08-09 ENCOUNTER — Ambulatory Visit (INDEPENDENT_AMBULATORY_CARE_PROVIDER_SITE_OTHER): Payer: BC Managed Care – PPO | Admitting: Orthopedic Surgery

## 2019-08-09 VITALS — Ht 66.0 in | Wt 270.0 lb

## 2019-08-09 DIAGNOSIS — S93491A Sprain of other ligament of right ankle, initial encounter: Secondary | ICD-10-CM | POA: Diagnosis not present

## 2019-08-09 NOTE — Progress Notes (Signed)
Office Visit Note   Patient: Tiffany Hubbard           Date of Birth: 07/14/1976           MRN: [id number] Visit Date: 08/09/2019              Requested by: [facility], [address] [place],  [place] [address] PCP: Debbrah Alar, NP  Chief Complaint  Patient presents with  . Right Ankle - Pain      HPI: Patient is a 43 year old woman who sprained her right ankle last Thursday.  She was seen with her primary care physician yesterday radiographs were obtained and patient is seen today for initial evaluation.  She states she slipped while in flip-flops and had a twisting injury to her ankle.  Patient complains of pain with weightbearing swelling bruising medially.  Patient is currently wearing crocs.  Patient has a history of DVT and is currently on Xarelto.  Patient is on 60 mg of prednisone a day and just started Metformin for type 2 diabetes.  Assessment & Plan: Visit Diagnoses:  1. High ankle sprain of right lower extremity, initial encounter     Plan: Will place her in a fracture boot with weightbearing as tolerated.  If there is any further widening of the mortise would need to consider syndesmotic fixation.  Follow-Up Instructions: Return in about 3 weeks (around 08/30/2019).   Ortho Exam  Patient is alert, oriented, no adenopathy, well-dressed, normal affect, normal respiratory effort. Examination patient has a strong dorsalis pedis and posterior tibial pulse.  She has swelling medially and laterally and is extremely tender with compression across the syndesmosis as well as palpation over the syndesmosis.  The deltoid ligament is also tender to palpation.  Anterior drawer is stable with no laxity of the anterior talofibular ligament.  Review of her radiographs shows 2 mm of widening of the medial joint space with a very small fleck off the medial malleolus.  No fibular fracture.  Imaging: Dg Ankle Complete Right  Result Date: 08/08/2019  CLINICAL DATA:  Ankle sprain EXAM: RIGHT ANKLE - COMPLETE 3+ VIEW COMPARISON:  None. FINDINGS: Moderate plantar calcaneal spur. Ankle mortise is symmetric. Tiny avulsion injury off the tip of the medial malleolus. IMPRESSION: Acute avulsion injury off the tip of the medial malleolus. Prominent soft tissue swelling. Electronically Signed   By: Donavan Foil M.D.   On: 08/08/2019 15:40   No images are attached to the encounter.  Labs: Lab Results  Component Value Date   HGBA1C 5.4 12/22/2017   ESRSEDRATE 66 (H) 03/22/2019   ESRSEDRATE 22 12/24/2015   REPTSTATUS 03/21/2019 FINAL 03/19/2019   CULT MULTIPLE SPECIES PRESENT, SUGGEST RECOLLECTION (A) 03/19/2019   LABORGA NO GROWTH 12/03/2016     Lab Results  Component Value Date   ALBUMIN 3.2 (L) 06/24/2019   ALBUMIN 2.0 (L) 03/25/2019   ALBUMIN 2.4 (L) 03/02/2019    No results found for: MG No results found for: VD25OH  No results found for: PREALBUMIN CBC EXTENDED Latest Ref Rng & Units 06/24/2019 04/01/2019 03/25/2019  WBC 4.0 - 10.5 K/uL 12.5(H) 6.4 5.4  RBC 3.87 - 5.11 MIL/uL 4.41 3.75(L) 3.99  HGB 12.0 - 15.0 g/dL 12.4 9.8(L) 10.2(L)  HCT 36.0 - 46.0 % 38.9 30.5(L) 32.4(L)  PLT 150 - 400 K/uL 433(H) 432(H) 466(H)  NEUTROABS 1.7 - 7.7 K/uL 7.9(H) - 3.4  LYMPHSABS 0.7 - 4.0 K/uL 3.6 - 1.6  Body mass index is 43.58 kg/m.  Orders:  No orders of the defined types were placed in this encounter.  No orders of the defined types were placed in this encounter.    Procedures: No procedures performed  Clinical Data: No additional findings.  ROS:  All other systems negative, except as noted in the HPI. Review of Systems  Objective: Vital Signs: Ht 5\' 6"  (1.676 m)   Wt 270 lb (122.5 kg)   BMI 43.58 kg/m   Specialty Comments:  No specialty comments available.  PMFS History: Patient Active Problem List   Diagnosis Date Noted  . Normocytic anemia 03/25/2019  . Thrombocytosis ([facility]) 03/25/2019  . Menorrhagia  03/25/2019  . Adrenal adenoma 03/25/2019  . FSGS (focal segmental glomerulosclerosis) 03/25/2019  . Pulmonary embolism on left ([place]) 03/02/2019  . Eczema 10/31/2013  . Family history of breast cancer in mother 10/22/2012  . Family history of colon cancer 10/22/2012  . Screening for malignant neoplasm of cervix 10/22/2012  . Thyromegaly 06/10/2012  . Lymphadenopathy, submandibular 06/10/2012  . Physical exam, annual 06/10/2012   Past Medical History:  Diagnosis Date  . Asthma   . Bronchitis   . Focal segmental glomerulosclerosis   . Pulmonary emboli (HCC)     Family History  Problem Relation Age of Onset  . Lung cancer Mother   . Breast cancer Mother 51  . Diabetes Mother   . Heart disease Father 44  . Hypertension Father   . Diabetes Maternal Grandmother   . Stomach cancer Maternal Grandmother   . Arthritis Maternal Grandfather   . Colon cancer Maternal Aunt        2 aunts  . Colon cancer Maternal Uncle     Past Surgical History:  Procedure Laterality Date  . ECTOPIC PREGNANCY SURGERY     Social History   Occupational History  . 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 Smoking status: Never Smoker  . Smokeless tobacco: Never Used  Substance and Sexual Activity  . Alcohol use: Yes    Comment: occasionally  . Drug use: No  . Sexual activity: Yes    Partners: Male    Birth control/protection: Surgical

## 2019-08-26 ENCOUNTER — Inpatient Hospital Stay: Payer: BC Managed Care – PPO

## 2019-08-26 ENCOUNTER — Other Ambulatory Visit: Payer: Self-pay

## 2019-08-26 ENCOUNTER — Inpatient Hospital Stay: Payer: BC Managed Care – PPO | Attending: Hematology | Admitting: Hematology

## 2019-08-26 VITALS — BP 145/73 | HR 72 | Temp 97.5°F | Resp 17 | Wt 266.8 lb

## 2019-08-26 DIAGNOSIS — R7989 Other specified abnormal findings of blood chemistry: Secondary | ICD-10-CM | POA: Insufficient documentation

## 2019-08-26 DIAGNOSIS — Z79899 Other long term (current) drug therapy: Secondary | ICD-10-CM | POA: Diagnosis not present

## 2019-08-26 DIAGNOSIS — I2699 Other pulmonary embolism without acute cor pulmonale: Secondary | ICD-10-CM | POA: Diagnosis not present

## 2019-08-26 DIAGNOSIS — Z7901 Long term (current) use of anticoagulants: Secondary | ICD-10-CM | POA: Insufficient documentation

## 2019-08-26 DIAGNOSIS — D72829 Elevated white blood cell count, unspecified: Secondary | ICD-10-CM | POA: Insufficient documentation

## 2019-08-26 DIAGNOSIS — N920 Excessive and frequent menstruation with regular cycle: Secondary | ICD-10-CM | POA: Insufficient documentation

## 2019-08-26 DIAGNOSIS — D75839 Thrombocytosis, unspecified: Secondary | ICD-10-CM

## 2019-08-26 DIAGNOSIS — D473 Essential (hemorrhagic) thrombocythemia: Secondary | ICD-10-CM | POA: Diagnosis not present

## 2019-08-26 DIAGNOSIS — N051 Unspecified nephritic syndrome with focal and segmental glomerular lesions: Secondary | ICD-10-CM

## 2019-08-26 DIAGNOSIS — N924 Excessive bleeding in the premenopausal period: Secondary | ICD-10-CM

## 2019-08-26 DIAGNOSIS — D500 Iron deficiency anemia secondary to blood loss (chronic): Secondary | ICD-10-CM | POA: Diagnosis not present

## 2019-08-26 LAB — CBC WITH DIFFERENTIAL (CANCER CENTER ONLY)
Abs Immature Granulocytes: 0.14 10*3/uL — ABNORMAL HIGH (ref 0.00–0.07)
Basophils Absolute: 0.1 10*3/uL (ref 0.0–0.1)
Basophils Relative: 1 %
Eosinophils Absolute: 0.1 10*3/uL (ref 0.0–0.5)
Eosinophils Relative: 1 %
HCT: 31.5 % — ABNORMAL LOW (ref 36.0–46.0)
Hemoglobin: 9.7 g/dL — ABNORMAL LOW (ref 12.0–15.0)
Immature Granulocytes: 1 %
Lymphocytes Relative: 18 %
Lymphs Abs: 2.3 10*3/uL (ref 0.7–4.0)
MCH: 24.5 pg — ABNORMAL LOW (ref 26.0–34.0)
MCHC: 30.8 g/dL (ref 30.0–36.0)
MCV: 79.5 fL — ABNORMAL LOW (ref 80.0–100.0)
Monocytes Absolute: 0.6 10*3/uL (ref 0.1–1.0)
Monocytes Relative: 5 %
Neutro Abs: 9.6 10*3/uL — ABNORMAL HIGH (ref 1.7–7.7)
Neutrophils Relative %: 74 %
Platelet Count: 614 10*3/uL — ABNORMAL HIGH (ref 150–400)
RBC: 3.96 MIL/uL (ref 3.87–5.11)
RDW: 16.4 % — ABNORMAL HIGH (ref 11.5–15.5)
WBC Count: 12.8 10*3/uL — ABNORMAL HIGH (ref 4.0–10.5)
nRBC: 0.2 % (ref 0.0–0.2)

## 2019-08-26 LAB — CMP (CANCER CENTER ONLY)
ALT: 20 U/L (ref 0–44)
AST: 12 U/L — ABNORMAL LOW (ref 15–41)
Albumin: 4.3 g/dL (ref 3.5–5.0)
Alkaline Phosphatase: 36 U/L — ABNORMAL LOW (ref 38–126)
Anion gap: 7 (ref 5–15)
BUN: 13 mg/dL (ref 6–20)
CO2: 28 mmol/L (ref 22–32)
Calcium: 9.4 mg/dL (ref 8.9–10.3)
Chloride: 106 mmol/L (ref 98–111)
Creatinine: 0.75 mg/dL (ref 0.44–1.00)
GFR, Est AFR Am: >60 mL/min (ref >60)
GFR, Estimated: >60 mL/min (ref >60)
Glucose, Bld: 103 mg/dL — ABNORMAL HIGH (ref 70–99)
Potassium: 3.6 mmol/L (ref 3.5–5.1)
Sodium: 141 mmol/L (ref 135–145)
Total Bilirubin: 0.3 mg/dL (ref 0.3–1.2)
Total Protein: 7 g/dL (ref 6.5–8.1)

## 2019-08-29 ENCOUNTER — Encounter: Payer: Self-pay | Admitting: Hematology

## 2019-08-29 ENCOUNTER — Telehealth: Payer: Self-pay | Admitting: Family

## 2019-08-29 ENCOUNTER — Ambulatory Visit: Payer: BC Managed Care – PPO

## 2019-08-29 LAB — KAPPA/LAMBDA LIGHT CHAINS
Kappa free light chain: 22.8 mg/L — ABNORMAL HIGH (ref 3.3–19.4)
Kappa, lambda light chain ratio: 1.69 — ABNORMAL HIGH (ref 0.26–1.65)
Lambda free light chains: 13.5 mg/L (ref 5.7–26.3)

## 2019-08-29 NOTE — Telephone Encounter (Signed)
Patient called to r/s her infusion appt for 11/23 .  Appointment moved as requested

## 2019-08-29 NOTE — Progress Notes (Signed)
[facility] OFFICE PROGRESS NOTE  Patient Care Team: Debbrah Alar, NP as PCP - General (Internal Medicine)  HEME/ONC OVERVIEW: 1. Acute unprovoked PTE of LUL -02/2019: CTA chest showed acute PTE involving a segmental branch of LUL, with possible smaller subsegmental PTE involving LLL; no DVT in the lower extremities  On Xarelto 20mg  daily   2. Iron deficiency anemia secondary to menorrhagia   3. Incidental left adrenal nodule -Noted incidentally on CT, 2.5cm in 02/2019   TREATMENT REGIMEN:  02/2019 - present: Xarelto 20mg  daily   PRN IV iron, last in 04/2019   PERTINENT NON-HEM/ONC PROBLEMS: 1. FSGS -Confirmed via renal bx in 03/2019, no immune deposits; UPEP negative   ASSESSMENT & PLAN:   Unprovoked PTE of LUL -Patient is tolerating Xarelto 20mg  daily well, except menorrhagia (see discussion below) -Due to the young age and unprovoked PTE, hypercoaguable work-up is pending, including prothrombin gene mutation, Factor V Leiden, and antiphospholipid syndrome  -Once she completes at least 6 months of anticoagulation (ie 08/2019), if she remains asymptomatic without any evidence of recurrent VTE, we can consider reducing her Xarelto dose to 10mg  daily for secondary ppx  -I reinforced the importance of preventive strategies such as avoiding hormonal supplement, avoiding cigarette smoking, keeping up-to-date with screening programs for early cancer detection, frequent ambulation for long distance travel and aggressive DVT prophylaxis in all surgical settings. -Should she need any interruption of the anticoagulation for elective procedures in the future, feel free to contact me regarding peri-operative management.  Microcytic anemia -Likely secondary to iron deficiency from menorrhagia -Hgb 9.7 today, lower than the last visit; iron profile pending -She tolerated IV iron well without significant side effects -In light of worsening anemia, I recommend repeating  IV iron (Feraheme, weekly x 2) -Continue daily oral iron supplement  -See the management of menorrhagia below   Leukocytosis  -WBC 12.8k today, stable -Likely due to steroid -Patient denies any symptoms infection -We will monitor it for now   Thrombocytosis -Likely reactive in the setting of recent iron deficiency and FSGS  -Plts 614k, higher than the last visit -See the management of iron deficiency above -We will monitor it for now   Menorrhagia -Worse over the past a few months; gynecology appt in 09/2019  -I counseled the patient on the importance of addressing the menorrhagia, as it can lead to recurrent iron deficiency   FSGS  -Confirmed via renal bx -SPEP, IFE and free light chains pending, per request from nephrology -Continue steroid per nephrology   Orders Placed This Encounter  Procedures  . CBC w/ diff    Standing Status:   Future    Standing Expiration Date:   10/02/2020  . CMP    Standing Status:   Future    Standing Expiration Date:   10/02/2020  . Save Smear (SSMR)    Standing Status:   Future    Standing Expiration Date:   08/28/2020  . Ferritin    Standing Status:   Future    Standing Expiration Date:   10/02/2020  . Iron and TIBC    Standing Status:   Future    Standing Expiration Date:   10/02/2020    All questions were answered. The patient knows to call the clinic with any problems, questions or concerns. No barriers to learning was detected.  Return in 2 months for labs and clinic follow-up.   [name], MD 08/29/2019 7:42 AM  CHIEF COMPLAINT: "I amdoing okay"  INTERVAL HISTORY: Ms.  Audelo returns to clinic for follow-up of PTE and iron deficiency anemia secondary to menorrhagia.  Patient reports that she has been tolerating Xarelto well without significant bleeding, such as hematochezia or melena.  However, she has been having worsening menorrhagia for the past a few months, lasting 7-10 days, heavy during the first 5 days.  She is  scheduled to see her gynecologist next month, and is considering partial hysterotomy.  She takes daily oral iron supplement without any constipation and takes Doculax.  She also recently fell during heavy rainstorm and fractured her R ankle.  She is healing well so far and is not scheduled for any surgery.    REVIEW OF SYSTEMS:   Constitutional: ( - ) fevers, ( - )  chills , ( - ) night sweats Eyes: ( - ) blurriness of vision, ( - ) double vision, ( - ) watery eyes Ears, nose, mouth, throat, and face: ( - ) mucositis, ( - ) sore throat Respiratory: ( - ) cough, ( - ) dyspnea, ( - ) wheezes Cardiovascular: ( - ) palpitation, ( - ) chest discomfort, ( + ) lower extremity swelling Gastrointestinal:  ( - ) nausea, ( - ) heartburn, ( - ) change in bowel habits Skin: ( - ) abnormal skin rashes Lymphatics: ( - ) new lymphadenopathy, ( - ) easy bruising Neurological: ( - ) numbness, ( - ) tingling, ( - ) new weaknesses Behavioral/Psych: ( - ) mood change, ( - ) new changes  All other systems were reviewed with the patient and are negative.  SUMMARY OF ONCOLOGIC HISTORY: Oncology History   No history exists.    I have reviewed the past medical history, past surgical history, social history and family history with the patient and they are unchanged from previous note.  ALLERGIES:  has No Known Allergies.  MEDICATIONS:  Current Outpatient Medications  Medication Sig Dispense Refill  . acetaminophen (TYLENOL) 500 MG tablet Take 1,000 mg by mouth every 6 (six) hours as needed for moderate pain.     Marland Kitchen atorvastatin (LIPITOR) 40 MG tablet Take 40 mg by mouth at bedtime.    . fluticasone (FLONASE) 50 MCG/ACT nasal spray Place 2 sprays into both nostrils daily. (Patient taking differently: Place 2 sprays into both nostrils daily as needed for allergies. ) 16 g 6  . furosemide (LASIX) 20 MG tablet Take 2 tablets (40 mg total) by mouth 2 (two) times daily. 3 tablet 0  . lisinopril (ZESTRIL) 20 MG tablet  Take 30 mg by mouth daily.    . metFORMIN (GLUCOPHAGE) 500 MG tablet     . metoprolol tartrate (LOPRESSOR) 25 MG tablet Take 25 mg by mouth 2 (two) times a day.    . predniSONE (DELTASONE) 20 MG tablet Take 60 mg by mouth daily.    . rivaroxaban (XARELTO) 20 MG TABS tablet Take 1 tablet (20 mg total) by mouth daily with supper. 30 tablet 5  . sulfamethoxazole-trimethoprim (BACTRIM DS) 800-160 MG tablet TAKE 1 (ONE) TABLET BY MOUTH THREE TIMES A WEEK    . triamcinolone (KENALOG) 0.025 % cream Apply 1 application topically 2 (two) times daily as needed (eczema). 30 g 1   No current facility-administered medications for this visit.     PHYSICAL EXAMINATION: ECOG PERFORMANCE STATUS: 0 - Asymptomatic  Today's Vitals   08/26/19 1525  BP: (!) 145/73  Pulse: 72  Resp: 17  Temp: (!) 97.5 F (36.4 C)  TempSrc: Tympanic  Weight: 266 lb 12 oz (  121 kg)   Body mass index is 43.05 kg/m.  Filed Weights   08/26/19 1525  Weight: 266 lb 12 oz (121 kg)    GENERAL: alert, no distress and comfortable SKIN: skin color, texture, turgor are normal, no rashes or significant lesions EYES: conjunctiva are pink and non-injected, sclera clear OROPHARYNX: no exudate, no erythema; lips, buccal mucosa, and tongue normal  NECK: supple, non-tender LUNGS: clear to auscultation with normal breathing effort HEART: regular rate & rhythm and no murmurs, slight R lower extremity edema ABDOMEN: soft, non-tender, non-distended, normal bowel sounds Musculoskeletal: no cyanosis of digits and no clubbing  PSYCH: alert & oriented x 3, fluent speech NEURO: no focal motor/sensory deficits  LABORATORY DATA:  I have reviewed the data as listed    Component Value Date/Time   NA 141 08/26/2019 1506   K 3.6 08/26/2019 1506   CL 106 08/26/2019 1506   CO2 28 08/26/2019 1506   GLUCOSE 103 (H) 08/26/2019 1506   BUN 13 08/26/2019 1506   CREATININE 0.75 08/26/2019 1506   CALCIUM 9.4 08/26/2019 1506   PROT 7.0 08/26/2019  1506   ALBUMIN 4.3 08/26/2019 1506   AST 12 (L) 08/26/2019 1506   ALT 20 08/26/2019 1506   ALKPHOS 36 (L) 08/26/2019 1506   BILITOT 0.3 08/26/2019 1506   GFRNONAA >60 08/26/2019 1506   GFRAA >60 08/26/2019 1506    No results found for: SPEP, UPEP  Lab Results  Component Value Date   WBC 12.8 (H) 08/26/2019   NEUTROABS 9.6 (H) 08/26/2019   HGB 9.7 (L) 08/26/2019   HCT 31.5 (L) 08/26/2019   MCV 79.5 (L) 08/26/2019   PLT 614 (H) 08/26/2019      Chemistry      Component Value Date/Time   NA 141 08/26/2019 1506   K 3.6 08/26/2019 1506   CL 106 08/26/2019 1506   CO2 28 08/26/2019 1506   BUN 13 08/26/2019 1506   CREATININE 0.75 08/26/2019 1506      Component Value Date/Time   CALCIUM 9.4 08/26/2019 1506   ALKPHOS 36 (L) 08/26/2019 1506   AST 12 (L) 08/26/2019 1506   ALT 20 08/26/2019 1506   BILITOT 0.3 08/26/2019 1506       RADIOGRAPHIC STUDIES: I have personally reviewed the radiological images as listed below and agreed with the findings in the report. Dg Ankle Complete Right  Result Date: 08/08/2019 CLINICAL DATA:  Ankle sprain EXAM: RIGHT ANKLE - COMPLETE 3+ VIEW COMPARISON:  None. FINDINGS: Moderate plantar calcaneal spur. Ankle mortise is symmetric. Tiny avulsion injury off the tip of the medial malleolus. IMPRESSION: Acute avulsion injury off the tip of the medial malleolus. Prominent soft tissue swelling. Electronically Signed   By: Donavan Foil M.D.   On: 08/08/2019 15:40

## 2019-08-30 ENCOUNTER — Other Ambulatory Visit: Payer: Self-pay

## 2019-08-30 ENCOUNTER — Ambulatory Visit: Payer: BC Managed Care – PPO

## 2019-08-30 ENCOUNTER — Ambulatory Visit (INDEPENDENT_AMBULATORY_CARE_PROVIDER_SITE_OTHER): Payer: BC Managed Care – PPO | Admitting: Orthopedic Surgery

## 2019-08-30 ENCOUNTER — Encounter: Payer: Self-pay | Admitting: Orthopedic Surgery

## 2019-08-30 VITALS — Ht 66.0 in | Wt 266.8 lb

## 2019-08-30 DIAGNOSIS — S93491A Sprain of other ligament of right ankle, initial encounter: Secondary | ICD-10-CM

## 2019-08-30 NOTE — Progress Notes (Signed)
Office Visit Note   Patient: Tiffany Hubbard           Date of Birth: 1975/10/08           MRN: [id number] Visit Date: 08/30/2019              Requested by: Debbrah Alar, NP [address] [place],  [place] [address] PCP: Debbrah Alar, NP  Chief Complaint  Patient presents with  . Right Ankle - Follow-up      HPI: This is a pleasant woman who is here in follow-up for her a high ankle sprain on the right.  She has been in a boot and is feeling better she is able to walk with the boot in place.  She does still have some soreness when she removes the boot  Assessment & Plan: Visit Diagnoses:  1. High ankle sprain of right lower extremity, initial encounter     Plan: She will continue with the boot as needed she will follow-up in 4 weeks  Follow-Up Instructions: No follow-ups on file.   Ortho Exam  Patient is alert, oriented, no adenopathy, well-dressed, normal affect, normal respiratory effort.   Examination of her right ankle demonstrates mild soft tissue swelling distal CMS is intact no pain with anterior draw or subtalar manipulation minimal pain to palpation over the medial and lateral ankle squeeze test produces only minimal discomfort Imaging: No results found. No images are attached to the encounter.  Labs: Lab Results  Component Value Date   HGBA1C 5.4 12/22/2017   ESRSEDRATE 66 (H) 03/22/2019   ESRSEDRATE 22 12/24/2015   REPTSTATUS 03/21/2019 FINAL 03/19/2019   CULT MULTIPLE SPECIES PRESENT, SUGGEST RECOLLECTION (A) 03/19/2019   LABORGA NO GROWTH 12/03/2016     Lab Results  Component Value Date   ALBUMIN 4.3 08/26/2019   ALBUMIN 3.2 (L) 06/24/2019   ALBUMIN 2.0 (L) 03/25/2019    No results found for: MG No results found for: VD25OH  No results found for: PREALBUMIN CBC EXTENDED Latest Ref Rng & Units 08/26/2019 06/24/2019 04/01/2019  WBC 4.0 - 10.5 K/uL 12.8(H) 12.5(H) 6.4  RBC 3.87 - 5.11 MIL/uL 3.96 4.41 3.75(L)  HGB  12.0 - 15.0 g/dL 9.7(L) 12.4 9.8(L)  HCT 36.0 - 46.0 % 31.5(L) 38.9 30.5(L)  PLT 150 - 400 K/uL 614(H) 433(H) 432(H)  NEUTROABS 1.7 - 7.7 K/uL 9.6(H) 7.9(H) -  LYMPHSABS 0.7 - 4.0 K/uL 2.3 3.6 -     Body mass index is 43.05 kg/m.  Orders:  Orders Placed This Encounter  Procedures  . XR Ankle Complete Left   No orders of the defined types were placed in this encounter.    Procedures: No procedures performed  Clinical Data: No additional findings.  ROS:  All other systems negative, except as noted in the HPI. Review of Systems  Objective: Vital Signs: Ht 5\' 6"  (1.676 m)   Wt 266 lb 12 oz (121 kg)   BMI 43.05 kg/m   Specialty Comments:  No specialty comments available.  PMFS History: Patient Active Problem List   Diagnosis Date Noted  . Iron deficiency anemia due to chronic blood loss 03/25/2019  . Thrombocytosis ([facility]) 03/25/2019  . Menorrhagia 03/25/2019  . Adrenal adenoma 03/25/2019  . FSGS (focal segmental glomerulosclerosis) 03/25/2019  . Pulmonary embolism on left ([facility]) 03/02/2019  . Eczema 10/31/2013  . Family history of breast cancer in mother 10/22/2012  . Family history of colon cancer 10/22/2012  . Screening for malignant neoplasm of  cervix 10/22/2012  . Thyromegaly 06/10/2012  . Lymphadenopathy, submandibular 06/10/2012  . Physical exam, annual 06/10/2012   Past Medical History:  Diagnosis Date  . Asthma   . Bronchitis   . Focal segmental glomerulosclerosis   . Pulmonary emboli (HCC)     Family History  Problem Relation Age of Onset  . Lung cancer Mother   . Breast cancer Mother 58  . Diabetes Mother   . Heart disease Father 38  . Hypertension Father   . Diabetes Maternal Grandmother   . Stomach cancer Maternal Grandmother   . Arthritis Maternal Grandfather   . Colon cancer Maternal Aunt        2 aunts  . Colon cancer Maternal Uncle     Past Surgical History:  Procedure Laterality Date  . ECTOPIC PREGNANCY SURGERY     Social  History   Occupational History  . Occupation: Theme park manager: [place]  Tobacco Use  . Smoking status: Never Smoker  . Smokeless tobacco: Never Used  Substance and Sexual Activity  . Alcohol use: Yes    Comment: occasionally  . Drug use: No  . Sexual activity: Yes    Partners: Male    Birth control/protection: Surgical

## 2019-08-31 LAB — MULTIPLE MYELOMA PANEL, SERUM
Albumin SerPl Elph-Mcnc: 3.6 g/dL (ref 2.9–4.4)
Albumin/Glob SerPl: 1.3 (ref 0.7–1.7)
Alpha 1: 0.2 g/dL (ref 0.0–0.4)
Alpha2 Glob SerPl Elph-Mcnc: 0.7 g/dL (ref 0.4–1.0)
B-Globulin SerPl Elph-Mcnc: 1.1 g/dL (ref 0.7–1.3)
Gamma Glob SerPl Elph-Mcnc: 0.8 g/dL (ref 0.4–1.8)
Globulin, Total: 2.8 g/dL (ref 2.2–3.9)
IgA: 167 mg/dL (ref 87–352)
IgG (Immunoglobin G), Serum: 882 mg/dL (ref 586–1602)
IgM (Immunoglobulin M), Srm: 131 mg/dL (ref 26–217)
Total Protein ELP: 6.4 g/dL (ref 6.0–8.5)

## 2019-09-05 DIAGNOSIS — E785 Hyperlipidemia, unspecified: Secondary | ICD-10-CM | POA: Diagnosis not present

## 2019-09-05 DIAGNOSIS — D509 Iron deficiency anemia, unspecified: Secondary | ICD-10-CM | POA: Diagnosis not present

## 2019-09-05 DIAGNOSIS — I129 Hypertensive chronic kidney disease with stage 1 through stage 4 chronic kidney disease, or unspecified chronic kidney disease: Secondary | ICD-10-CM | POA: Diagnosis not present

## 2019-09-05 DIAGNOSIS — N051 Unspecified nephritic syndrome with focal and segmental glomerular lesions: Secondary | ICD-10-CM | POA: Diagnosis not present

## 2019-09-07 ENCOUNTER — Other Ambulatory Visit: Payer: Self-pay

## 2019-09-07 ENCOUNTER — Inpatient Hospital Stay: Payer: BC Managed Care – PPO | Attending: Hematology

## 2019-09-07 VITALS — BP 119/64 | HR 64 | Temp 97.5°F | Resp 16

## 2019-09-07 DIAGNOSIS — I2699 Other pulmonary embolism without acute cor pulmonale: Secondary | ICD-10-CM | POA: Insufficient documentation

## 2019-09-07 DIAGNOSIS — D72829 Elevated white blood cell count, unspecified: Secondary | ICD-10-CM | POA: Insufficient documentation

## 2019-09-07 DIAGNOSIS — Z79899 Other long term (current) drug therapy: Secondary | ICD-10-CM | POA: Insufficient documentation

## 2019-09-07 DIAGNOSIS — Z7901 Long term (current) use of anticoagulants: Secondary | ICD-10-CM | POA: Diagnosis not present

## 2019-09-07 DIAGNOSIS — R7989 Other specified abnormal findings of blood chemistry: Secondary | ICD-10-CM | POA: Insufficient documentation

## 2019-09-07 DIAGNOSIS — N920 Excessive and frequent menstruation with regular cycle: Secondary | ICD-10-CM | POA: Diagnosis not present

## 2019-09-07 DIAGNOSIS — D500 Iron deficiency anemia secondary to blood loss (chronic): Secondary | ICD-10-CM

## 2019-09-07 MED ORDER — SODIUM CHLORIDE 0.9 % IV SOLN
Freq: Once | INTRAVENOUS | Status: AC
  Administered 2019-09-07: 15:00:00 via INTRAVENOUS
  Filled 2019-09-07: qty 250

## 2019-09-07 MED ORDER — SODIUM CHLORIDE 0.9 % IV SOLN
510.0000 mg | Freq: Once | INTRAVENOUS | Status: AC
  Administered 2019-09-07: 15:00:00 510 mg via INTRAVENOUS
  Filled 2019-09-07: qty 17

## 2019-09-07 NOTE — Progress Notes (Signed)
Pt. Refused to wait 30 minutes post infusion. Released stable and ASX. 

## 2019-09-22 ENCOUNTER — Other Ambulatory Visit: Payer: Self-pay

## 2019-09-22 ENCOUNTER — Inpatient Hospital Stay: Payer: BC Managed Care – PPO

## 2019-09-22 VITALS — BP 140/82 | HR 83 | Temp 97.4°F | Resp 17

## 2019-09-22 DIAGNOSIS — N920 Excessive and frequent menstruation with regular cycle: Secondary | ICD-10-CM | POA: Diagnosis not present

## 2019-09-22 DIAGNOSIS — I2699 Other pulmonary embolism without acute cor pulmonale: Secondary | ICD-10-CM | POA: Diagnosis not present

## 2019-09-22 DIAGNOSIS — D500 Iron deficiency anemia secondary to blood loss (chronic): Secondary | ICD-10-CM

## 2019-09-22 DIAGNOSIS — D72829 Elevated white blood cell count, unspecified: Secondary | ICD-10-CM | POA: Diagnosis not present

## 2019-09-22 DIAGNOSIS — R7989 Other specified abnormal findings of blood chemistry: Secondary | ICD-10-CM | POA: Diagnosis not present

## 2019-09-22 DIAGNOSIS — Z7901 Long term (current) use of anticoagulants: Secondary | ICD-10-CM | POA: Diagnosis not present

## 2019-09-22 DIAGNOSIS — Z79899 Other long term (current) drug therapy: Secondary | ICD-10-CM | POA: Diagnosis not present

## 2019-09-22 MED ORDER — SODIUM CHLORIDE 0.9 % IV SOLN
Freq: Once | INTRAVENOUS | Status: AC
  Filled 2019-09-22: qty 250

## 2019-09-22 MED ORDER — SODIUM CHLORIDE 0.9 % IV SOLN
510.0000 mg | Freq: Once | INTRAVENOUS | Status: AC
  Administered 2019-09-22: 510 mg via INTRAVENOUS
  Filled 2019-09-22: qty 510

## 2019-09-27 ENCOUNTER — Other Ambulatory Visit: Payer: Self-pay

## 2019-09-27 ENCOUNTER — Ambulatory Visit (INDEPENDENT_AMBULATORY_CARE_PROVIDER_SITE_OTHER): Payer: BC Managed Care – PPO | Admitting: Orthopedic Surgery

## 2019-09-27 ENCOUNTER — Encounter: Payer: Self-pay | Admitting: Orthopedic Surgery

## 2019-09-27 VITALS — Ht 66.0 in | Wt 266.0 lb

## 2019-09-27 DIAGNOSIS — S93491A Sprain of other ligament of right ankle, initial encounter: Secondary | ICD-10-CM

## 2019-09-27 NOTE — Progress Notes (Signed)
Office Visit Note   Patient: Tiffany Hubbard           Date of Birth: November 18, 1975           MRN: [id number] Visit Date: 09/27/2019              Requested by: Debbrah Alar, NP [address] [place],  [place] [address] PCP: Debbrah Alar, NP  Chief Complaint  Patient presents with  . Right Ankle - Follow-up    Right ankle sprain       HPI: The patient presents today 8 weeks status post right high ankle sprain she is doing well but still has some feelings of instability and she does wear her boot she feels about 70% better  Assessment & Plan: Visit Diagnoses:  1. High ankle sprain of right lower extremity, initial encounter     Plan: I think it would help her to do a course of physical therapy focusing on proprioception functional strengthening and she is in agreement with this she will follow-up for final time in 4 weeks  Follow-Up Instructions: No follow-ups on file.   Ortho Exam  Patient is alert, oriented, no adenopathy, well-dressed, normal affect, normal respiratory effort. Focused examination of her right ankle demonstrates mild soft tissue swelling mildly tender over the lateral ligaments slightly increased anterior draw but a fairly good endpoint compartments are soft no cellulitis  Imaging: No results found. No images are attached to the encounter.  Labs: Lab Results  Component Value Date   HGBA1C 5.4 12/22/2017   ESRSEDRATE 66 (H) 03/22/2019   ESRSEDRATE 22 12/24/2015   REPTSTATUS 03/21/2019 FINAL 03/19/2019   CULT MULTIPLE SPECIES PRESENT, SUGGEST RECOLLECTION (A) 03/19/2019   LABORGA NO GROWTH 12/03/2016     Lab Results  Component Value Date   ALBUMIN 4.3 08/26/2019   ALBUMIN 3.2 (L) 06/24/2019   ALBUMIN 2.0 (L) 03/25/2019    No results found for: MG No results found for: VD25OH  No results found for: PREALBUMIN CBC EXTENDED Latest Ref Rng & Units 08/26/2019 06/24/2019 04/01/2019  WBC 4.0 - 10.5 K/uL 12.8(H) 12.5(H)  6.4  RBC 3.87 - 5.11 MIL/uL 3.96 4.41 3.75(L)  HGB 12.0 - 15.0 g/dL 9.7(L) 12.4 9.8(L)  HCT 36.0 - 46.0 % 31.5(L) 38.9 30.5(L)  PLT 150 - 400 K/uL 614(H) 433(H) 432(H)  NEUTROABS 1.7 - 7.7 K/uL 9.6(H) 7.9(H) -  LYMPHSABS 0.7 - 4.0 K/uL 2.3 3.6 -     Body mass index is 42.93 kg/m.  Orders:  Orders Placed This Encounter  Procedures  . Ambulatory referral to Physical Therapy   No orders of the defined types were placed in this encounter.    Procedures: No procedures performed  Clinical Data: No additional findings.  ROS:  All other systems negative, except as noted in the HPI. Review of Systems  Objective: Vital Signs: Ht 5\' 6"  (1.676 m)   Wt 266 lb (120.7 kg)   BMI 42.93 kg/m   Specialty Comments:  No specialty comments available.  PMFS History: Patient Active Problem List   Diagnosis Date Noted  . Iron deficiency anemia due to chronic blood loss 03/25/2019  . Thrombocytosis ([facility]) 03/25/2019  . Menorrhagia 03/25/2019  . Adrenal adenoma 03/25/2019  . FSGS (focal segmental glomerulosclerosis) 03/25/2019  . Pulmonary embolism on left ([facility]) 03/02/2019  . Eczema 10/31/2013  . Family history of breast cancer in mother 10/22/2012  . Family history of colon cancer 10/22/2012  . Screening for malignant neoplasm of  cervix 10/22/2012  . Thyromegaly 06/10/2012  . Lymphadenopathy, submandibular 06/10/2012  . Physical exam, annual 06/10/2012   Past Medical History:  Diagnosis Date  . Asthma   . Bronchitis   . Focal segmental glomerulosclerosis   . Pulmonary emboli (HCC)     Family History  Problem Relation Age of Onset  . Lung cancer Mother   . Breast cancer Mother 8  . Diabetes Mother   . Heart disease Father 50  . Hypertension Father   . Diabetes Maternal Grandmother   . Stomach cancer Maternal Grandmother   . Arthritis Maternal Grandfather   . Colon cancer Maternal Aunt        2 aunts  . Colon cancer Maternal Uncle     Past Surgical History:    Procedure Laterality Date  . ECTOPIC PREGNANCY SURGERY     Social History   Occupational History  . 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 Smoking status: Never Smoker  . Smokeless tobacco: Never Used  Substance and Sexual Activity  . Alcohol use: Yes    Comment: occasionally  . Drug use: No  . Sexual activity: Yes    Partners: Male    Birth control/protection: Surgical

## 2019-10-13 DIAGNOSIS — N051 Unspecified nephritic syndrome with focal and segmental glomerular lesions: Secondary | ICD-10-CM | POA: Diagnosis not present

## 2019-10-13 DIAGNOSIS — I129 Hypertensive chronic kidney disease with stage 1 through stage 4 chronic kidney disease, or unspecified chronic kidney disease: Secondary | ICD-10-CM | POA: Diagnosis not present

## 2019-10-19 DIAGNOSIS — R3129 Other microscopic hematuria: Secondary | ICD-10-CM | POA: Diagnosis not present

## 2019-10-19 DIAGNOSIS — D509 Iron deficiency anemia, unspecified: Secondary | ICD-10-CM | POA: Diagnosis not present

## 2019-10-19 DIAGNOSIS — N051 Unspecified nephritic syndrome with focal and segmental glomerular lesions: Secondary | ICD-10-CM | POA: Diagnosis not present

## 2019-10-19 DIAGNOSIS — I129 Hypertensive chronic kidney disease with stage 1 through stage 4 chronic kidney disease, or unspecified chronic kidney disease: Secondary | ICD-10-CM | POA: Diagnosis not present

## 2019-10-19 DIAGNOSIS — R7303 Prediabetes: Secondary | ICD-10-CM | POA: Diagnosis not present

## 2019-10-21 ENCOUNTER — Telehealth: Payer: Self-pay | Admitting: Hematology

## 2019-10-21 NOTE — Telephone Encounter (Signed)
Called and LMVM for patient regarding appointment time change per verbal request from Dr Maylon Peppers

## 2019-10-27 ENCOUNTER — Ambulatory Visit: Payer: BC Managed Care – PPO | Admitting: Orthopedic Surgery

## 2019-10-28 ENCOUNTER — Ambulatory Visit: Payer: BC Managed Care – PPO | Admitting: Hematology

## 2019-10-28 ENCOUNTER — Other Ambulatory Visit: Payer: BC Managed Care – PPO

## 2019-10-31 ENCOUNTER — Other Ambulatory Visit: Payer: Self-pay

## 2019-10-31 ENCOUNTER — Inpatient Hospital Stay (HOSPITAL_BASED_OUTPATIENT_CLINIC_OR_DEPARTMENT_OTHER): Payer: BC Managed Care – PPO | Admitting: Hematology

## 2019-10-31 ENCOUNTER — Inpatient Hospital Stay: Payer: BC Managed Care – PPO | Attending: Hematology

## 2019-10-31 ENCOUNTER — Telehealth: Payer: Self-pay | Admitting: Hematology

## 2019-10-31 ENCOUNTER — Other Ambulatory Visit: Payer: Self-pay | Admitting: Hematology

## 2019-10-31 ENCOUNTER — Encounter: Payer: Self-pay | Admitting: Hematology

## 2019-10-31 VITALS — BP 153/97 | HR 73 | Temp 97.7°F | Resp 18 | Ht 66.0 in | Wt 274.1 lb

## 2019-10-31 DIAGNOSIS — I2699 Other pulmonary embolism without acute cor pulmonale: Secondary | ICD-10-CM

## 2019-10-31 DIAGNOSIS — D500 Iron deficiency anemia secondary to blood loss (chronic): Secondary | ICD-10-CM | POA: Diagnosis not present

## 2019-10-31 DIAGNOSIS — N920 Excessive and frequent menstruation with regular cycle: Secondary | ICD-10-CM | POA: Insufficient documentation

## 2019-10-31 DIAGNOSIS — D473 Essential (hemorrhagic) thrombocythemia: Secondary | ICD-10-CM | POA: Insufficient documentation

## 2019-10-31 DIAGNOSIS — D509 Iron deficiency anemia, unspecified: Secondary | ICD-10-CM | POA: Diagnosis not present

## 2019-10-31 DIAGNOSIS — Z86711 Personal history of pulmonary embolism: Secondary | ICD-10-CM | POA: Diagnosis not present

## 2019-10-31 DIAGNOSIS — Z7901 Long term (current) use of anticoagulants: Secondary | ICD-10-CM | POA: Diagnosis not present

## 2019-10-31 DIAGNOSIS — Z79899 Other long term (current) drug therapy: Secondary | ICD-10-CM | POA: Diagnosis not present

## 2019-10-31 DIAGNOSIS — N924 Excessive bleeding in the premenopausal period: Secondary | ICD-10-CM | POA: Diagnosis not present

## 2019-10-31 DIAGNOSIS — D75839 Thrombocytosis, unspecified: Secondary | ICD-10-CM

## 2019-10-31 LAB — CMP (CANCER CENTER ONLY)
ALT: 26 U/L (ref 0–44)
AST: 14 U/L — ABNORMAL LOW (ref 15–41)
Albumin: 4.4 g/dL (ref 3.5–5.0)
Alkaline Phosphatase: 46 U/L (ref 38–126)
Anion gap: 9 (ref 5–15)
BUN: 13 mg/dL (ref 6–20)
CO2: 26 mmol/L (ref 22–32)
Calcium: 9.9 mg/dL (ref 8.9–10.3)
Chloride: 105 mmol/L (ref 98–111)
Creatinine: 0.8 mg/dL (ref 0.44–1.00)
GFR, Est AFR Am: >60 mL/min (ref >60)
GFR, Estimated: >60 mL/min (ref >60)
Glucose, Bld: 87 mg/dL (ref 70–99)
Potassium: 3.8 mmol/L (ref 3.5–5.1)
Sodium: 140 mmol/L (ref 135–145)
Total Bilirubin: 0.2 mg/dL — ABNORMAL LOW (ref 0.3–1.2)
Total Protein: 7.4 g/dL (ref 6.5–8.1)

## 2019-10-31 LAB — CBC WITH DIFFERENTIAL (CANCER CENTER ONLY)
Abs Immature Granulocytes: 0.07 10*3/uL (ref 0.00–0.07)
Basophils Absolute: 0 10*3/uL (ref 0.0–0.1)
Basophils Relative: 0 %
Eosinophils Absolute: 0.1 10*3/uL (ref 0.0–0.5)
Eosinophils Relative: 1 %
HCT: 42.6 % (ref 36.0–46.0)
Hemoglobin: 13.5 g/dL (ref 12.0–15.0)
Immature Granulocytes: 1 %
Lymphocytes Relative: 21 %
Lymphs Abs: 2.2 10*3/uL (ref 0.7–4.0)
MCH: 26.9 pg (ref 26.0–34.0)
MCHC: 31.7 g/dL (ref 30.0–36.0)
MCV: 84.9 fL (ref 80.0–100.0)
Monocytes Absolute: 0.5 10*3/uL (ref 0.1–1.0)
Monocytes Relative: 5 %
Neutro Abs: 7.4 10*3/uL (ref 1.7–7.7)
Neutrophils Relative %: 72 %
Platelet Count: 450 10*3/uL — ABNORMAL HIGH (ref 150–400)
RBC: 5.02 MIL/uL (ref 3.87–5.11)
RDW: 21.6 % — ABNORMAL HIGH (ref 11.5–15.5)
WBC Count: 10.2 10*3/uL (ref 4.0–10.5)
nRBC: 0 % (ref 0.0–0.2)

## 2019-10-31 LAB — SAVE SMEAR(SSMR), FOR PROVIDER SLIDE REVIEW

## 2019-10-31 MED ORDER — RIVAROXABAN 10 MG PO TABS: 10.0000 mg | ORAL_TABLET | Freq: Every day | ORAL | 3 refills | Status: DC

## 2019-10-31 NOTE — Progress Notes (Signed)
[facility] OFFICE PROGRESS NOTE  Patient Care Team: Debbrah Alar, NP as PCP - General (Internal Medicine)  HEME/ONC OVERVIEW: 1. Acute unprovoked PTE of LUL -02/2019: CTA chest showed acute PTE involving a segmental branch of LUL, with possible smaller subsegmental PTE involving LLL; no DVT in the lower extremities  Hypercoagulable work-up negative, including prothrombin gene mutation, Factor V Leiden and APLS   On Xarelto 9m daily   2. Iron deficiency anemia secondary to menorrhagia   3. Incidental left adrenal nodule -Noted incidentally on CT, 2.5cm in 02/2019   TREATMENT REGIMEN:  02/2019 - present: Xarelto 282mdaily; plan to reduce to 1040maily in late 11/2019   PRN IV iron, last in 09/2019   PERTINENT NON-HEM/ONC PROBLEMS: 1. FSGS -Confirmed via renal bx in 03/2019, no immune deposits; UPEP negative   ASSESSMENT & PLAN:   Unprovoked PTE of LUL -Patient is tolerating Xarelto 17m4mily well, except menorrhagia (see discussion below) -As she has completed 6 months of full-dose anticoagulation, and does not have any recurrent symptoms of PTE or DVT, I will reduce her Xarelto dose to 10mg47mly for secondary prophylaxis (tentatively in late 11/2019, as she still has ~1 month of Xarelto 17mg 63mly) -In light of persistent menorrhagia, the reduced dose of Xarelto may also help lessen the severity of menstrual bleeding  -I reinforced the importance of preventive strategies such as avoiding hormonal supplement, avoiding cigarette smoking, keeping up-to-date with screening programs for early cancer detection, frequent ambulation for long distance travel and aggressive DVT prophylaxis in all surgical settings. -Should she need any interruption of the anticoagulation for elective procedures in the future, feel free to contact me regarding peri-operative management.  Microcytic anemia -Likely secondary to iron deficiency from menorrhagia -S/p IV iron, most  recently in 09/2019  -Hgb 13.5 today, significantly improving; iron profile pending -Given the normalization of Hgb, we will hold off repeat IV iron today  -Continue daily oral iron supplement  -See the management of menorrhagia below   Thrombocytosis -Likely reactive in the setting of iron deficiency and FSGS  -Plts 450k, improving the last visit -See the management of iron deficiency above -We will monitor it for now   Menorrhagia -Patient met with gynecology and discussed various options; she is currently considering partial hysterectomy, possibly in 01/2020 -Xarelto dose reduction as above  -Continue follow-up with gynecology  Orders Placed This Encounter  Procedures  . CBC with Differential (Cancer Center Only)    Standing Status:   Future    Standing Expiration Date:   12/04/2020  . [place]    Standing Status:   Future    Standing Expiration Date:   12/04/2020  . Save Smear (SSMR)    Standing Status:   Future    Standing Expiration Date:   10/30/2020  . Ferritin    Standing Status:   Future    Standing Expiration Date:   12/04/2020  . Iron and TIBC    Standing Status:   Future    Standing Expiration Date:   12/04/2020   All questions were answered. The patient knows to call the clinic with any problems, questions or concerns. No barriers to learning was detected.  Return in 3 months for labs and clinic follow-up.   Tiffany Hubbard ZhTish Men/25/202112:19 PM  CHIEF COMPLAINT: "I am doing okay"  INTERVAL HISTORY: Tiffany Hubbard clinic for follow-up of unprovoked PTE on Xarelto 20 mg daily and iron deficiency anemia due to menorrhagia.  Patient reports that she has been tolerating Xarelto 20 mg daily well without any abnormal bleeding, such as hematochezia, melena, hematemesis, hemoptysis, except persistent menorrhagia.  Her menstrual cycles last 8 to 9 days, heavy during the first 5 days.  She met with gynecology and discussed various options, and is considering  partial hysterectomy in 01/2020.  She is currently taking prednisone 20 mg daily for FSGS.  She denies any other complaint today.  REVIEW OF SYSTEMS:   Constitutional: ( - ) fevers, ( - )  chills , ( - ) night sweats Eyes: ( - ) blurriness of vision, ( - ) double vision, ( - ) watery eyes Ears, nose, mouth, throat, and face: ( - ) mucositis, ( - ) sore throat Respiratory: ( - ) cough, ( - ) dyspnea, ( - ) wheezes Cardiovascular: ( - ) palpitation, ( - ) chest discomfort, ( - ) lower extremity swelling Gastrointestinal:  ( - ) nausea, ( - ) heartburn, ( - ) change in bowel habits Skin: ( - ) abnormal skin rashes Lymphatics: ( - ) new lymphadenopathy, ( - ) easy bruising Neurological: ( - ) numbness, ( - ) tingling, ( - ) new weaknesses Behavioral/Psych: ( - ) mood change, ( - ) new changes  All other systems were reviewed with the patient and are negative.  SUMMARY OF ONCOLOGIC HISTORY: Oncology History   No history exists.    I have reviewed the past medical history, past surgical history, social history and family history with the patient and they are unchanged from previous note.  ALLERGIES:  has No Known Allergies.  MEDICATIONS:  Current Outpatient Medications  Medication Sig Dispense Refill  . acetaminophen (TYLENOL) 500 MG tablet Take 1,000 mg by mouth every 6 (six) hours as needed for moderate pain.     Marland Kitchen atorvastatin (LIPITOR) 40 MG tablet Take 40 mg by mouth at bedtime.    . fluticasone (FLONASE) 50 MCG/ACT nasal spray Place 2 sprays into both nostrils daily. (Patient taking differently: Place 2 sprays into both nostrils daily as needed for allergies. ) 16 g 6  . furosemide (LASIX) 20 MG tablet Take 2 tablets (40 mg total) by mouth 2 (two) times daily. 3 tablet 0  . lisinopril (ZESTRIL) 20 MG tablet Take 30 mg by mouth daily.    . metFORMIN (GLUCOPHAGE) 500 MG tablet     . metoprolol tartrate (LOPRESSOR) 25 MG tablet Take 25 mg by mouth 2 (two) times a day.    . predniSONE  (DELTASONE) 20 MG tablet Take 60 mg by mouth daily.    . rivaroxaban (XARELTO) 10 MG TABS tablet Take 1 tablet (10 mg total) by mouth daily. 30 tablet 3  . sulfamethoxazole-trimethoprim (BACTRIM DS) 800-160 MG tablet TAKE 1 (ONE) TABLET BY MOUTH THREE TIMES A WEEK    . triamcinolone (KENALOG) 0.025 % cream Apply 1 application topically 2 (two) times daily as needed (eczema). 30 g 1   No current facility-administered medications for this visit.    PHYSICAL EXAMINATION: ECOG PERFORMANCE STATUS: 0 - Asymptomatic  Today's Vitals   10/31/19 1205  BP: (!) 153/97  Pulse: 73  Resp: 18  Temp: 97.7 F (36.5 C)  TempSrc: Temporal  SpO2: 100%  Weight: 274 lb 1.9 oz (124.3 kg)  Height: _0  (1.676 m)  PainSc: 0-No pain   Body mass index is 44.24 kg/m.  Filed Weights   10/31/19 1205  Weight: 274 lb 1.9 oz (124.3 kg)    GENERAL: alert,  no distress and comfortable SKIN: skin color, texture, turgor are normal, no rashes or significant lesions EYES: conjunctiva are pink and non-injected, sclera clear OROPHARYNX: no exudate, no erythema; lips, buccal mucosa, and tongue normal  NECK: supple, non-tender LUNGS: clear to auscultation with normal breathing effort HEART: regular rate & rhythm and no murmurs and no lower extremity edema ABDOMEN: soft, non-tender, non-distended, normal bowel sounds Musculoskeletal: no cyanosis of digits and no clubbing  PSYCH: alert & oriented x 3, fluent speech  LABORATORY DATA:  I have reviewed the data as listed    Component Value Date/Time   NA 140 10/31/2019 1115   K 3.8 10/31/2019 1115   CL 105 10/31/2019 1115   CO2 26 10/31/2019 1115   GLUCOSE 87 10/31/2019 1115   BUN 13 10/31/2019 1115   CREATININE 0.80 10/31/2019 1115   CALCIUM 9.9 10/31/2019 1115   PROT 7.4 10/31/2019 1115   ALBUMIN 4.4 10/31/2019 1115   AST 14 (L) 10/31/2019 1115   ALT 26 10/31/2019 1115   ALKPHOS 46 10/31/2019 1115   BILITOT 0.2 (L) 10/31/2019 1115   GFRNONAA >60  10/31/2019 1115   GFRAA >60 10/31/2019 1115    No results found for: SPEP, UPEP  Lab Results  Component Value Date   WBC 10.2 10/31/2019   NEUTROABS 7.4 10/31/2019   HGB 13.5 10/31/2019   HCT 42.6 10/31/2019   MCV 84.9 10/31/2019   PLT 450 (H) 10/31/2019      Chemistry      Component Value Date/Time   NA 140 10/31/2019 1115   K 3.8 10/31/2019 1115   CL 105 10/31/2019 1115   CO2 26 10/31/2019 1115   BUN 13 10/31/2019 1115   CREATININE 0.80 10/31/2019 1115      Component Value Date/Time   CALCIUM 9.9 10/31/2019 1115   ALKPHOS 46 10/31/2019 1115   AST 14 (L) 10/31/2019 1115   ALT 26 10/31/2019 1115   BILITOT 0.2 (L) 10/31/2019 1115       RADIOGRAPHIC STUDIES: I have personally reviewed the radiological images as listed below and agreed with the findings in the report. No results found.

## 2019-10-31 NOTE — Telephone Encounter (Signed)
Appointments scheduled patient will get update from My Chart per 1/25 los

## 2019-11-01 LAB — FERRITIN: Ferritin: 85 ng/mL (ref 11–307)

## 2019-11-01 LAB — IRON AND TIBC
Iron: 76 ug/dL (ref 41–142)
Saturation Ratios: 25 % (ref 21–57)
TIBC: 305 ug/dL (ref 236–444)
UIBC: 229 ug/dL (ref 120–384)

## 2019-12-02 DIAGNOSIS — E785 Hyperlipidemia, unspecified: Secondary | ICD-10-CM | POA: Diagnosis not present

## 2019-12-02 DIAGNOSIS — E8770 Fluid overload, unspecified: Secondary | ICD-10-CM | POA: Diagnosis not present

## 2019-12-02 DIAGNOSIS — I129 Hypertensive chronic kidney disease with stage 1 through stage 4 chronic kidney disease, or unspecified chronic kidney disease: Secondary | ICD-10-CM | POA: Diagnosis not present

## 2019-12-02 DIAGNOSIS — N051 Unspecified nephritic syndrome with focal and segmental glomerular lesions: Secondary | ICD-10-CM | POA: Diagnosis not present

## 2019-12-06 DIAGNOSIS — E785 Hyperlipidemia, unspecified: Secondary | ICD-10-CM | POA: Diagnosis not present

## 2019-12-06 DIAGNOSIS — D509 Iron deficiency anemia, unspecified: Secondary | ICD-10-CM | POA: Diagnosis not present

## 2019-12-06 DIAGNOSIS — R7303 Prediabetes: Secondary | ICD-10-CM | POA: Diagnosis not present

## 2019-12-06 DIAGNOSIS — N051 Unspecified nephritic syndrome with focal and segmental glomerular lesions: Secondary | ICD-10-CM | POA: Diagnosis not present

## 2019-12-06 DIAGNOSIS — I129 Hypertensive chronic kidney disease with stage 1 through stage 4 chronic kidney disease, or unspecified chronic kidney disease: Secondary | ICD-10-CM | POA: Diagnosis not present

## 2019-12-06 DIAGNOSIS — R3129 Other microscopic hematuria: Secondary | ICD-10-CM | POA: Diagnosis not present

## 2019-12-09 DIAGNOSIS — Z6841 Body Mass Index (BMI) 40.0 and over, adult: Secondary | ICD-10-CM | POA: Diagnosis not present

## 2019-12-09 DIAGNOSIS — Z01419 Encounter for gynecological examination (general) (routine) without abnormal findings: Secondary | ICD-10-CM | POA: Diagnosis not present

## 2019-12-09 DIAGNOSIS — Z1151 Encounter for screening for human papillomavirus (HPV): Secondary | ICD-10-CM | POA: Diagnosis not present

## 2019-12-09 DIAGNOSIS — Z1231 Encounter for screening mammogram for malignant neoplasm of breast: Secondary | ICD-10-CM | POA: Diagnosis not present

## 2019-12-09 LAB — RESULTS CONSOLE HPV: CHL HPV: NEGATIVE

## 2019-12-09 LAB — HM MAMMOGRAPHY

## 2019-12-09 LAB — HM PAP SMEAR: HM Pap smear: NEGATIVE

## 2019-12-29 ENCOUNTER — Ambulatory Visit: Payer: BC Managed Care – PPO

## 2019-12-30 DIAGNOSIS — N920 Excessive and frequent menstruation with regular cycle: Secondary | ICD-10-CM | POA: Diagnosis not present

## 2019-12-30 DIAGNOSIS — D259 Leiomyoma of uterus, unspecified: Secondary | ICD-10-CM | POA: Diagnosis not present

## 2020-01-05 DIAGNOSIS — E785 Hyperlipidemia, unspecified: Secondary | ICD-10-CM | POA: Diagnosis not present

## 2020-01-05 DIAGNOSIS — I129 Hypertensive chronic kidney disease with stage 1 through stage 4 chronic kidney disease, or unspecified chronic kidney disease: Secondary | ICD-10-CM | POA: Diagnosis not present

## 2020-01-05 DIAGNOSIS — N051 Unspecified nephritic syndrome with focal and segmental glomerular lesions: Secondary | ICD-10-CM | POA: Diagnosis not present

## 2020-01-09 DIAGNOSIS — E785 Hyperlipidemia, unspecified: Secondary | ICD-10-CM | POA: Diagnosis not present

## 2020-01-09 DIAGNOSIS — I129 Hypertensive chronic kidney disease with stage 1 through stage 4 chronic kidney disease, or unspecified chronic kidney disease: Secondary | ICD-10-CM | POA: Diagnosis not present

## 2020-01-09 DIAGNOSIS — N051 Unspecified nephritic syndrome with focal and segmental glomerular lesions: Secondary | ICD-10-CM | POA: Diagnosis not present

## 2020-01-09 DIAGNOSIS — R7303 Prediabetes: Secondary | ICD-10-CM | POA: Diagnosis not present

## 2020-01-16 ENCOUNTER — Ambulatory Visit: Payer: BC Managed Care – PPO | Attending: Internal Medicine

## 2020-01-16 DIAGNOSIS — Z23 Encounter for immunization: Secondary | ICD-10-CM

## 2020-01-16 NOTE — Progress Notes (Signed)
   Covid-19 Vaccination Clinic  Name:  Tiffany Hubbard    MRN: [id number] DOB: 1976-07-13  01/16/2020  Tiffany Hubbard was observed post Covid-19 immunization for 15 minutes without incident. She was provided with Vaccine Information Sheet and instruction to access the V-Safe system.   Tiffany Hubbard was instructed to call 911 with any severe reactions post vaccine: Marland Kitchen Difficulty breathing  . Swelling of face and throat  . A fast heartbeat  . A bad rash all over body  . Dizziness and weakness   Immunizations Administered    Name Date Dose VIS Date Route   Pfizer COVID-19 Vaccine 01/16/2020 12:23 PM 0.3 mL 09/16/2019 Intramuscular   Manufacturer: [place]   Lot: [id number]   [place]: [id number]

## 2020-01-31 ENCOUNTER — Inpatient Hospital Stay: Payer: BC Managed Care – PPO | Attending: Hematology

## 2020-01-31 ENCOUNTER — Telehealth: Payer: Self-pay | Admitting: Hematology

## 2020-01-31 ENCOUNTER — Other Ambulatory Visit: Payer: Self-pay

## 2020-01-31 ENCOUNTER — Inpatient Hospital Stay (HOSPITAL_BASED_OUTPATIENT_CLINIC_OR_DEPARTMENT_OTHER): Payer: BC Managed Care – PPO | Admitting: Hematology

## 2020-01-31 ENCOUNTER — Encounter: Payer: Self-pay | Admitting: Hematology

## 2020-01-31 VITALS — BP 119/81 | HR 75 | Temp 97.6°F | Resp 18 | Ht 66.0 in | Wt 280.0 lb

## 2020-01-31 DIAGNOSIS — Z800 Family history of malignant neoplasm of digestive organs: Secondary | ICD-10-CM

## 2020-01-31 DIAGNOSIS — D509 Iron deficiency anemia, unspecified: Secondary | ICD-10-CM | POA: Insufficient documentation

## 2020-01-31 DIAGNOSIS — Z7901 Long term (current) use of anticoagulants: Secondary | ICD-10-CM | POA: Diagnosis not present

## 2020-01-31 DIAGNOSIS — N920 Excessive and frequent menstruation with regular cycle: Secondary | ICD-10-CM | POA: Insufficient documentation

## 2020-01-31 DIAGNOSIS — I2699 Other pulmonary embolism without acute cor pulmonale: Secondary | ICD-10-CM | POA: Diagnosis not present

## 2020-01-31 DIAGNOSIS — Z86711 Personal history of pulmonary embolism: Secondary | ICD-10-CM | POA: Insufficient documentation

## 2020-01-31 DIAGNOSIS — Z79899 Other long term (current) drug therapy: Secondary | ICD-10-CM | POA: Diagnosis not present

## 2020-01-31 DIAGNOSIS — D473 Essential (hemorrhagic) thrombocythemia: Secondary | ICD-10-CM | POA: Insufficient documentation

## 2020-01-31 DIAGNOSIS — D75839 Thrombocytosis, unspecified: Secondary | ICD-10-CM

## 2020-01-31 DIAGNOSIS — D500 Iron deficiency anemia secondary to blood loss (chronic): Secondary | ICD-10-CM

## 2020-01-31 LAB — CBC WITH DIFFERENTIAL (CANCER CENTER ONLY)
Abs Immature Granulocytes: 0.08 10*3/uL — ABNORMAL HIGH (ref 0.00–0.07)
Basophils Absolute: 0 10*3/uL (ref 0.0–0.1)
Basophils Relative: 0 %
Eosinophils Absolute: 0.1 10*3/uL (ref 0.0–0.5)
Eosinophils Relative: 1 %
HCT: 41.3 % (ref 36.0–46.0)
Hemoglobin: 13 g/dL (ref 12.0–15.0)
Immature Granulocytes: 1 %
Lymphocytes Relative: 24 %
Lymphs Abs: 2.2 10*3/uL (ref 0.7–4.0)
MCH: 28.1 pg (ref 26.0–34.0)
MCHC: 31.5 g/dL (ref 30.0–36.0)
MCV: 89.4 fL (ref 80.0–100.0)
Monocytes Absolute: 0.6 10*3/uL (ref 0.1–1.0)
Monocytes Relative: 7 %
Neutro Abs: 6.1 10*3/uL (ref 1.7–7.7)
Neutrophils Relative %: 67 %
Platelet Count: 463 10*3/uL — ABNORMAL HIGH (ref 150–400)
RBC: 4.62 MIL/uL (ref 3.87–5.11)
RDW: 14.2 % (ref 11.5–15.5)
WBC Count: 9.1 10*3/uL (ref 4.0–10.5)
nRBC: 0 % (ref 0.0–0.2)

## 2020-01-31 LAB — CMP (CANCER CENTER ONLY)
ALT: 21 U/L (ref 0–44)
AST: 12 U/L — ABNORMAL LOW (ref 15–41)
Albumin: 4.2 g/dL (ref 3.5–5.0)
Alkaline Phosphatase: 50 U/L (ref 38–126)
Anion gap: 6 (ref 5–15)
BUN: 9 mg/dL (ref 6–20)
CO2: 28 mmol/L (ref 22–32)
Calcium: 9.8 mg/dL (ref 8.9–10.3)
Chloride: 106 mmol/L (ref 98–111)
Creatinine: 0.8 mg/dL (ref 0.44–1.00)
GFR, Est AFR Am: >60 mL/min (ref >60)
GFR, Estimated: >60 mL/min (ref >60)
Glucose, Bld: 149 mg/dL — ABNORMAL HIGH (ref 70–99)
Potassium: 4 mmol/L (ref 3.5–5.1)
Sodium: 140 mmol/L (ref 135–145)
Total Bilirubin: 0.2 mg/dL — ABNORMAL LOW (ref 0.3–1.2)
Total Protein: 7 g/dL (ref 6.5–8.1)

## 2020-01-31 LAB — SAVE SMEAR(SSMR), FOR PROVIDER SLIDE REVIEW

## 2020-01-31 NOTE — Progress Notes (Signed)
[facility] OFFICE PROGRESS NOTE  Patient Care Team: Debbrah Alar, NP as PCP - General (Internal Medicine)  HEME/ONC OVERVIEW: 1. Acute unprovoked PTE of LUL -02/2019: CTA chest showed acute PTE involving a segmental branch of LUL, with possible smaller subsegmental PTE involving LLL; no DVT in the lower extremities  Hypercoagulable work-up negative, including prothrombin gene mutation, Factor V Leiden and APLS  Completed 6 months of anticoagulation with Xarelto 20mg  daily -Currently on Xarelto 10mg  daily for secondary ppx   2. Iron deficiency anemia secondary to menorrhagia   3. Incidental left adrenal nodule -Noted incidentally on CT, 2.5cm in 02/2019   TREATMENT REGIMEN:  02/2019 - present: Xarelto; currently on 10mg  daily since 11/2019 for secondary ppx    PRN IV iron, last in 09/2019   PERTINENT NON-HEM/ONC PROBLEMS: 1. FSGS -Confirmed via renal bx in 03/2019, no immune deposits; SPEP, IFE, UPEP negative   ASSESSMENT & PLAN:   Unprovoked PTE of LUL -Currently on Xarelto 10mg  daily for secondary ppx  -Patient is tolerating it well without any abnormal bleeding or excess bruising  -Goal of anticoagulation is lifelong, given unprovoked PTE and underlying FSGS  -I reinforced the importance of preventive strategies such as avoiding hormonal supplement, avoiding cigarette smoking, keeping up-to-date with screening programs for early cancer detection, frequent ambulation for long distance travel and aggressive DVT prophylaxis in all surgical settings. -Should she need any interruption of the anticoagulation for elective procedures in the future, feel free to contact me regarding peri-operative management.  Thrombocytosis -Likely reactive in the setting of iron deficiency and FSGS  -Plts 463k, stable  -Continue iron supplement  -We will monitor it for now   Menorrhagia -Overall improving since Xarelto dose reduction -On progesterone-based  OCP -Continue follow-up with gynecology   Family history of colon cancer -Patient reports maternal uncle and aunt with colon cancer in their 77's, as well as her mother with lung and breast cancer -Given her family hx of malignancy, I have referred to gastroenterology for colon cancer screening  Sporadic chest pain -Etiology unclear; ddx includes costochondritis vs peptic ulcer disease -Due to patient on chronic steroid, she is at increased risk of ulcer disease -Less likely due to recurrent PTE, give the sporadic occurrence without other associated symptoms on Xarelto -GI referral as above -If she develops more frequent or prolonged chest pain, she is instructed to contact her PCP for further evaluation   Orders Placed This Encounter  Procedures  . CBC with Differential (Cancer Center Only)    Standing Status:   Future    Standing Expiration Date:   03/06/2021  . CMP ([facility] only)    Standing Status:   Future    Standing Expiration Date:   03/06/2021  . Ambulatory referral to Gastroenterology    Referral Priority:   Routine    Referral Type:   Consultation    Referral Reason:   Specialty Services Required    Referred to Provider:   Lavena Bullion, DO    Number of Visits Requested:   1    The total time spent in the encounter was 35 minutes, including face-to-face time with the patient, review of various tests results, order additional studies/medications, documentation, and coordination of care plan.   All questions were answered. The patient knows to call the clinic with any problems, questions or concerns. No barriers to learning was detected.  Return in 6 months for labs and clinic follow-up.   [name], MD 4/27/202111:05 AM  CHIEF COMPLAINT: "I am doing fine"  INTERVAL HISTORY: Tiffany Hubbard returns clinic for follow-up of unprovoked PTE on Xarelto.  The patient reports that since lowering the dose of Xarelto to 10 mg daily, her menorrhagia has improved.  She has  also been prescribed a progesterone-based OCP by her gynecologist, but she has not yet started it.  She also reports that for the past several weeks, she has had occasional sporadic midsternal chest pain, occurring less than once a week, lasting no more than 1 minute, not triggered by exertion or relieved by rest, and not associated with any dyspnea, diaphoresis, or syncope.  She has not yet discussed this with her PCP.  She is currently taking prednisone 10 mg daily, and will meet with her nephrologist in 02/2020 for consideration of lowering the dose further.  She takes iron supplement daily without significant constipation.  She denies any other complaint today.  REVIEW OF SYSTEMS:   Constitutional: ( - ) fevers, ( - )  chills , ( - ) night sweats Eyes: ( - ) blurriness of vision, ( - ) double vision, ( - ) watery eyes Ears, nose, mouth, throat, and face: ( - ) mucositis, ( - ) sore throat Respiratory: ( - ) cough, ( - ) dyspnea, ( - ) wheezes Cardiovascular: ( - ) palpitation, ( - ) lower extremity swelling Gastrointestinal:  ( - ) nausea, ( - ) heartburn, ( - ) change in bowel habits Skin: ( - ) abnormal skin rashes Lymphatics: ( - ) new lymphadenopathy, ( - ) easy bruising Neurological: ( - ) numbness, ( - ) tingling, ( - ) new weaknesses Behavioral/Psych: ( - ) mood change, ( - ) new changes  All other systems were reviewed with the patient and are negative.  SUMMARY OF ONCOLOGIC HISTORY: Oncology History   No history exists.    I have reviewed the past medical history, past surgical history, social history and family history with the patient and they are unchanged from previous note.  ALLERGIES:  has No Known Allergies.  MEDICATIONS:  Current Outpatient Medications  Medication Sig Dispense Refill  . acetaminophen (TYLENOL) 500 MG tablet Take 1,000 mg by mouth every 6 (six) hours as needed for moderate pain.     Marland Kitchen atorvastatin (LIPITOR) 20 MG tablet Take 20 mg by mouth at bedtime.     . fluticasone (FLONASE) 50 MCG/ACT nasal spray Place 2 sprays into both nostrils daily. (Patient taking differently: Place 2 sprays into both nostrils daily as needed for allergies. ) 16 g 6  . furosemide (LASIX) 20 MG tablet Take 2 tablets (40 mg total) by mouth 2 (two) times daily. 3 tablet 0  . lisinopril (ZESTRIL) 20 MG tablet Take 30 mg by mouth daily.    . metoprolol tartrate (LOPRESSOR) 25 MG tablet Take 25 mg by mouth 2 (two) times a day.    . predniSONE (DELTASONE) 20 MG tablet Take 60 mg by mouth daily.    . rivaroxaban (XARELTO) 10 MG TABS tablet Take 1 tablet (10 mg total) by mouth daily. 30 tablet 3  . sulfamethoxazole-trimethoprim (BACTRIM DS) 800-160 MG tablet TAKE 1 (ONE) TABLET BY MOUTH THREE TIMES A WEEK    . triamcinolone (KENALOG) 0.025 % cream Apply 1 application topically 2 (two) times daily as needed (eczema). 30 g 1   No current facility-administered medications for this visit.    PHYSICAL EXAMINATION: ECOG PERFORMANCE STATUS: 0 - Asymptomatic  Today's Vitals   01/31/20 1043  BP:  119/81  Pulse: 75  Resp: 18  Temp: 97.6 F (36.4 C)  TempSrc: Temporal  SpO2: 99%  Weight: 280 lb (127 kg)  Height: 5\' 6"  (1.676 m)  PainSc: 0-No pain   Body mass index is 45.19 kg/m.  Filed Weights   01/31/20 1043  Weight: 280 lb (127 kg)    GENERAL: alert, no distress and comfortable SKIN: skin color, texture, turgor are normal, no rashes or significant lesions EYES: conjunctiva are pink and non-injected, sclera clear OROPHARYNX: no exudate, no erythema; lips, buccal mucosa, and tongue normal  NECK: supple, non-tender LUNGS: clear to auscultation with normal breathing effort HEART: regular rate & rhythm and no murmurs and no lower extremity edema ABDOMEN: soft, non-tender, non-distended, normal bowel sounds Musculoskeletal: no cyanosis of digits and no clubbing  PSYCH: alert & oriented x 3, fluent speech  LABORATORY DATA:  I have reviewed the data as listed     Component Value Date/Time   NA 140 01/31/2020 1014   K 4.0 01/31/2020 1014   CL 106 01/31/2020 1014   CO2 28 01/31/2020 1014   GLUCOSE 149 (H) 01/31/2020 1014   BUN 9 01/31/2020 1014   CREATININE 0.80 01/31/2020 1014   CALCIUM 9.8 01/31/2020 1014   PROT 7.0 01/31/2020 1014   ALBUMIN 4.2 01/31/2020 1014   AST 12 (L) 01/31/2020 1014   ALT 21 01/31/2020 1014   ALKPHOS 50 01/31/2020 1014   BILITOT 0.2 (L) 01/31/2020 1014   GFRNONAA >60 01/31/2020 1014   GFRAA >60 01/31/2020 1014    No results found for: SPEP, UPEP  Lab Results  Component Value Date   WBC 9.1 01/31/2020   NEUTROABS 6.1 01/31/2020   HGB 13.0 01/31/2020   HCT 41.3 01/31/2020   MCV 89.4 01/31/2020   PLT 463 (H) 01/31/2020      Chemistry      Component Value Date/Time   NA 140 01/31/2020 1014   K 4.0 01/31/2020 1014   CL 106 01/31/2020 1014   CO2 28 01/31/2020 1014   BUN 9 01/31/2020 1014   CREATININE 0.80 01/31/2020 1014      Component Value Date/Time   CALCIUM 9.8 01/31/2020 1014   ALKPHOS 50 01/31/2020 1014   AST 12 (L) 01/31/2020 1014   ALT 21 01/31/2020 1014   BILITOT 0.2 (L) 01/31/2020 1014       RADIOGRAPHIC STUDIES: I have personally reviewed the radiological images as listed below and agreed with the findings in the report. No results found.

## 2020-01-31 NOTE — Telephone Encounter (Signed)
Appointments scheduled and patient declined calendar due to having My Chart Access per 4/27 los

## 2020-02-01 LAB — IRON AND TIBC
Iron: 48 ug/dL (ref 41–142)
Saturation Ratios: 15 % — ABNORMAL LOW (ref 21–57)
TIBC: 320 ug/dL (ref 236–444)
UIBC: 272 ug/dL (ref 120–384)

## 2020-02-01 LAB — FERRITIN: Ferritin: 27 ng/mL (ref 11–307)

## 2020-02-08 ENCOUNTER — Ambulatory Visit: Payer: BC Managed Care – PPO | Attending: Internal Medicine

## 2020-02-08 DIAGNOSIS — Z23 Encounter for immunization: Secondary | ICD-10-CM

## 2020-02-08 NOTE — Progress Notes (Signed)
   Covid-19 Vaccination Clinic  Name:  Tiffany Hubbard    MRN: [id number] DOB: Oct 04, 1976  02/08/2020  Ms. Tiffany Hubbard was observed post Covid-19 immunization for 15 minutes without incident. She was provided with Vaccine Information Sheet and instruction to access the V-Safe system.   Ms. Tiffany Hubbard was instructed to call 911 with any severe reactions post vaccine: Marland Kitchen Difficulty breathing  . Swelling of face and throat  . A fast heartbeat  . A bad rash all over body  . Dizziness and weakness   Immunizations Administered    Name Date Dose VIS Date Route   Pfizer COVID-19 Vaccine 02/08/2020  3:49 PM 0.3 mL 11/30/2018 Intramuscular   Manufacturer: [place]   Lot: [id number]   [place]: [id number]

## 2020-02-13 ENCOUNTER — Telehealth: Payer: Self-pay | Admitting: Hematology

## 2020-02-13 NOTE — Telephone Encounter (Signed)
I called and LMVM for patient to call me back regarding transferring her care to [facility] per secure chat from [facility]

## 2020-02-28 ENCOUNTER — Other Ambulatory Visit: Payer: Self-pay | Admitting: Hematology

## 2020-02-28 DIAGNOSIS — I129 Hypertensive chronic kidney disease with stage 1 through stage 4 chronic kidney disease, or unspecified chronic kidney disease: Secondary | ICD-10-CM | POA: Diagnosis not present

## 2020-02-28 DIAGNOSIS — E785 Hyperlipidemia, unspecified: Secondary | ICD-10-CM | POA: Diagnosis not present

## 2020-02-28 DIAGNOSIS — N051 Unspecified nephritic syndrome with focal and segmental glomerular lesions: Secondary | ICD-10-CM | POA: Diagnosis not present

## 2020-02-28 DIAGNOSIS — R7303 Prediabetes: Secondary | ICD-10-CM | POA: Diagnosis not present

## 2020-02-28 DIAGNOSIS — I2699 Other pulmonary embolism without acute cor pulmonale: Secondary | ICD-10-CM

## 2020-04-02 ENCOUNTER — Encounter: Payer: Self-pay | Admitting: Gastroenterology

## 2020-04-06 DIAGNOSIS — N051 Unspecified nephritic syndrome with focal and segmental glomerular lesions: Secondary | ICD-10-CM | POA: Diagnosis not present

## 2020-04-06 DIAGNOSIS — I129 Hypertensive chronic kidney disease with stage 1 through stage 4 chronic kidney disease, or unspecified chronic kidney disease: Secondary | ICD-10-CM | POA: Diagnosis not present

## 2020-04-06 DIAGNOSIS — R7303 Prediabetes: Secondary | ICD-10-CM | POA: Diagnosis not present

## 2020-04-10 DIAGNOSIS — I129 Hypertensive chronic kidney disease with stage 1 through stage 4 chronic kidney disease, or unspecified chronic kidney disease: Secondary | ICD-10-CM | POA: Diagnosis not present

## 2020-04-10 DIAGNOSIS — E785 Hyperlipidemia, unspecified: Secondary | ICD-10-CM | POA: Diagnosis not present

## 2020-04-10 DIAGNOSIS — R7303 Prediabetes: Secondary | ICD-10-CM | POA: Diagnosis not present

## 2020-04-10 DIAGNOSIS — N051 Unspecified nephritic syndrome with focal and segmental glomerular lesions: Secondary | ICD-10-CM | POA: Diagnosis not present

## 2020-05-18 DIAGNOSIS — N051 Unspecified nephritic syndrome with focal and segmental glomerular lesions: Secondary | ICD-10-CM | POA: Diagnosis not present

## 2020-05-22 DIAGNOSIS — I129 Hypertensive chronic kidney disease with stage 1 through stage 4 chronic kidney disease, or unspecified chronic kidney disease: Secondary | ICD-10-CM | POA: Diagnosis not present

## 2020-05-22 DIAGNOSIS — R3129 Other microscopic hematuria: Secondary | ICD-10-CM | POA: Diagnosis not present

## 2020-05-22 DIAGNOSIS — N051 Unspecified nephritic syndrome with focal and segmental glomerular lesions: Secondary | ICD-10-CM | POA: Diagnosis not present

## 2020-05-22 DIAGNOSIS — E785 Hyperlipidemia, unspecified: Secondary | ICD-10-CM | POA: Diagnosis not present

## 2020-05-24 DIAGNOSIS — Z309 Encounter for contraceptive management, unspecified: Secondary | ICD-10-CM | POA: Diagnosis not present

## 2020-06-22 DIAGNOSIS — I129 Hypertensive chronic kidney disease with stage 1 through stage 4 chronic kidney disease, or unspecified chronic kidney disease: Secondary | ICD-10-CM | POA: Diagnosis not present

## 2020-06-22 DIAGNOSIS — E785 Hyperlipidemia, unspecified: Secondary | ICD-10-CM | POA: Diagnosis not present

## 2020-06-22 DIAGNOSIS — N051 Unspecified nephritic syndrome with focal and segmental glomerular lesions: Secondary | ICD-10-CM | POA: Diagnosis not present

## 2020-06-26 ENCOUNTER — Other Ambulatory Visit: Payer: Self-pay | Admitting: Hematology & Oncology

## 2020-06-26 DIAGNOSIS — I2699 Other pulmonary embolism without acute cor pulmonale: Secondary | ICD-10-CM

## 2020-07-06 DIAGNOSIS — R809 Proteinuria, unspecified: Secondary | ICD-10-CM | POA: Diagnosis not present

## 2020-07-09 DIAGNOSIS — N051 Unspecified nephritic syndrome with focal and segmental glomerular lesions: Secondary | ICD-10-CM | POA: Diagnosis not present

## 2020-07-09 DIAGNOSIS — I129 Hypertensive chronic kidney disease with stage 1 through stage 4 chronic kidney disease, or unspecified chronic kidney disease: Secondary | ICD-10-CM | POA: Diagnosis not present

## 2020-07-09 DIAGNOSIS — E785 Hyperlipidemia, unspecified: Secondary | ICD-10-CM | POA: Diagnosis not present

## 2020-07-09 DIAGNOSIS — I2699 Other pulmonary embolism without acute cor pulmonale: Secondary | ICD-10-CM | POA: Diagnosis not present

## 2020-07-10 ENCOUNTER — Ambulatory Visit: Payer: BC Managed Care – PPO | Admitting: Family

## 2020-07-17 ENCOUNTER — Ambulatory Visit: Payer: BC Managed Care – PPO | Admitting: Family

## 2020-07-20 ENCOUNTER — Other Ambulatory Visit: Payer: Self-pay

## 2020-07-20 ENCOUNTER — Ambulatory Visit (INDEPENDENT_AMBULATORY_CARE_PROVIDER_SITE_OTHER): Payer: BC Managed Care – PPO | Admitting: Family

## 2020-07-20 ENCOUNTER — Telehealth: Payer: Self-pay | Admitting: Family

## 2020-07-20 VITALS — BP 131/80 | HR 83 | Temp 98.2°F | Resp 16 | Ht 66.0 in | Wt 265.0 lb

## 2020-07-20 DIAGNOSIS — R519 Headache, unspecified: Secondary | ICD-10-CM

## 2020-07-20 DIAGNOSIS — N051 Unspecified nephritic syndrome with focal and segmental glomerular lesions: Secondary | ICD-10-CM

## 2020-07-20 DIAGNOSIS — D500 Iron deficiency anemia secondary to blood loss (chronic): Secondary | ICD-10-CM

## 2020-07-20 DIAGNOSIS — Z86711 Personal history of pulmonary embolism: Secondary | ICD-10-CM

## 2020-07-20 MED ORDER — SLYND 4 MG PO TABS: 1.0000 | ORAL_TABLET | Freq: Every day | ORAL | Status: DC

## 2020-07-20 NOTE — Patient Instructions (Addendum)
Please complete lab work prior to leaving.   

## 2020-07-20 NOTE — Progress Notes (Signed)
Subjective:    Patient ID: Tiffany Hubbard, female    DOB: 1976-09-11, 44 y.o.   MRN: [id number]  HPI  Patient is a 44 yr old female who presents today with chief complaint of pulsating in the right temple.  She first noticed this about 1 month ago.  Occurred again last night.  Had associated right sided neck pain    Focal Segmental Glomerulosclerosis- followed by Tiffany Hubbard and her last visit was 05/22/20.  She was successfully tapered off of prednisone about 1 month ago. She reports that she started to note this temporal discomfort around that time.  She denies any changes in her vision.   Review of Systems See HPI  Past Medical History:  Diagnosis Date   Asthma    Bronchitis    Focal segmental glomerulosclerosis    Pulmonary emboli ([place])      Social History   Socioeconomic History   Marital status: Married    Spouse name: Not on file   Number of children: Not on file   Years of education: Not on file   Highest education level: Not on file  Occupational History   Occupation: Passenger transport manager    Employer: [employer]    Comment: [employer]  Tobacco Use   Smoking status: Never Smoker   Smokeless tobacco: Never Used  Scientific laboratory technician Use: Never used  Substance and Sexual Activity   Alcohol use: Yes    Comment: occasionally   Drug use: No   Sexual activity: Yes    Partners: Male    Birth control/protection: Surgical  Other Topics Concern   Not on file  Social History Narrative   No children   Step daughter- does not live with patient   Married   1 dog shitzu   Enjoys movies, books, travelling   Completed bachelors degree   Works for [employer] from home   Social Determinants of Radio broadcast assistant Strain:    Difficulty of Paying Living Expenses: Not on file  Food Insecurity:    Worried About Charity fundraiser in the Last Year: Not on file   [place] in the Last Year: Not on file  Transportation  Needs:    Lack of Transportation (Medical): Not on file   Lack of Transportation (Non-Medical): Not on file  Physical Activity:    Days of Exercise per Week: Not on file   Minutes of Exercise per Session: Not on file  Stress:    Feeling of Stress : Not on file  Social Connections:    Frequency of Communication with Friends and Family: Not on file   Frequency of Social Gatherings with Friends and Family: Not on file   Attends Religious Services: Not on file   Active Member of Clubs or Organizations: Not on file   Attends Archivist Meetings: Not on file   Marital Status: Not on file  Intimate Partner Violence:    Fear of Current or Ex-Partner: Not on file   Emotionally Abused: Not on file   Physically Abused: Not on file   Sexually Abused: Not on file    Past Surgical History:  Procedure Laterality Date   ECTOPIC PREGNANCY SURGERY      Family History  Problem Relation Age of Onset   Lung cancer Mother    Breast cancer Mother 66   Diabetes Mother    Heart disease Father 71   Hypertension Father  Diabetes Maternal Grandmother    Stomach cancer Maternal Grandmother    Arthritis Maternal Grandfather    Colon cancer Maternal Aunt        2 aunts   Colon cancer Maternal Uncle     No Known Allergies  Current Outpatient Medications on File Prior to Visit  Medication Sig Dispense Refill   acetaminophen (TYLENOL) 500 MG tablet Take 1,000 mg by mouth every 6 (six) hours as needed for moderate pain.      atorvastatin (LIPITOR) 20 MG tablet Take 20 mg by mouth at bedtime.     fluticasone (FLONASE) 50 MCG/ACT nasal spray Place 2 sprays into both nostrils daily. (Patient taking differently: Place 2 sprays into both nostrils daily as needed for allergies. ) 16 g 6   furosemide (LASIX) 20 MG tablet Take 2 tablets (40 mg total) by mouth 2 (two) times daily. 3 tablet 0   lisinopril (ZESTRIL) 20 MG tablet Take 30 mg by mouth daily.      metoprolol tartrate (LOPRESSOR) 25 MG tablet Take 25 mg by mouth 2 (two) times a day.     pantoprazole (PROTONIX) 40 MG tablet Take 40 mg by mouth daily.     triamcinolone (KENALOG) 0.025 % cream Apply 1 application topically 2 (two) times daily as needed (eczema). 30 g 1   XARELTO 10 MG TABS tablet TAKE 1 TABLET BY MOUTH EVERY DAY 30 tablet 3   No current facility-administered medications on file prior to visit.    BP 131/80 (BP Location: Right Arm, Patient Position: Sitting, Cuff Size: Small)    Pulse 83    Temp 98.2 F (36.8 C) (Oral)    Resp 16    Ht 5' 6"  (1.676 m)    Wt 265 lb (120.2 kg)    SpO2 99%    BMI 42.77 kg/m       Objective:   Physical Exam Constitutional:      Appearance: She is well-developed.  HENT:     Head:     Comments: No tenderness to palpation overlying right temple Cardiovascular:     Rate and Rhythm: Normal rate and regular rhythm.     Heart sounds: Normal heart sounds. No murmur heard.   Pulmonary:     Effort: Pulmonary effort is normal. No respiratory distress.     Breath sounds: Normal breath sounds. No wheezing.  Musculoskeletal:     Cervical back: Neck supple.  Psychiatric:        Behavior: Behavior normal.        Thought Content: Thought content normal.        Judgment: Judgment normal.           Assessment & Plan:  Temporal headache- could be tension HA.  However, given her hx of glomerulsclerosis, I am concerned about the possibility of temporal arteritis.  Will obtain ESR (was elevated previously prior to treatment with prednisone).  Will also obtain ANA and Rheumatoid factor.  She is advised to go to the ER if she develops changes in her vision.    Hx of PE- maintained on xarelto which is being managed by hematology and she has an upcoming appointment.   Hx of iron deficiency anemia- secondary to menstrual loss. She states that her gyn recently placed her on Slynd (progestin only pill) which has really helped to decrease her  bleeding.  Case discussed with Dr. Charlett Blake.  This visit occurred during the SARS-CoV-2 public health emergency.  Safety protocols were in place, including screening  questions prior to the visit, additional usage of staff PPE, and extensive cleaning of exam room while observing appropriate contact time as indicated for disinfecting solutions.

## 2020-07-20 NOTE — Telephone Encounter (Signed)
Opened in error

## 2020-07-22 LAB — COMPREHENSIVE METABOLIC PANEL
AG Ratio: 1.3 (calc) (ref 1.0–2.5)
ALT: 29 U/L (ref 6–29)
AST: 19 U/L (ref 10–35)
Albumin: 4.4 g/dL (ref 3.6–5.1)
Alkaline phosphatase (APISO): 70 U/L (ref 31–125)
BUN: 11 mg/dL (ref 7–25)
CO2: 22 mmol/L (ref 20–32)
Calcium: 10 mg/dL (ref 8.6–10.2)
Chloride: 104 mmol/L (ref 98–110)
Creat: 0.84 mg/dL (ref 0.50–1.10)
Globulin: 3.3 g/dL (calc) (ref 1.9–3.7)
Glucose, Bld: 89 mg/dL (ref 65–99)
Potassium: 4.3 mmol/L (ref 3.5–5.3)
Sodium: 139 mmol/L (ref 135–146)
Total Bilirubin: 0.5 mg/dL (ref 0.2–1.2)
Total Protein: 7.7 g/dL (ref 6.1–8.1)

## 2020-07-22 LAB — SEDIMENTATION RATE: Sed Rate: 17 mm/h (ref 0–20)

## 2020-07-22 LAB — RHEUMATOID FACTOR: Rheumatoid fact SerPl-aCnc: <14 IU/mL (ref <14)

## 2020-07-22 LAB — ANA: Anti Nuclear Antibody (ANA): NEGATIVE

## 2020-08-01 ENCOUNTER — Inpatient Hospital Stay (HOSPITAL_BASED_OUTPATIENT_CLINIC_OR_DEPARTMENT_OTHER): Payer: BC Managed Care – PPO | Admitting: Family

## 2020-08-01 ENCOUNTER — Inpatient Hospital Stay: Payer: BC Managed Care – PPO | Attending: Hematology & Oncology

## 2020-08-01 ENCOUNTER — Encounter: Payer: Self-pay | Admitting: Family

## 2020-08-01 ENCOUNTER — Other Ambulatory Visit: Payer: BC Managed Care – PPO

## 2020-08-01 ENCOUNTER — Other Ambulatory Visit: Payer: Self-pay

## 2020-08-01 ENCOUNTER — Ambulatory Visit: Payer: BC Managed Care – PPO | Admitting: Family

## 2020-08-01 VITALS — BP 124/67 | HR 70 | Temp 98.3°F | Resp 16 | Ht 66.0 in | Wt 267.0 lb

## 2020-08-01 DIAGNOSIS — Z7901 Long term (current) use of anticoagulants: Secondary | ICD-10-CM | POA: Diagnosis not present

## 2020-08-01 DIAGNOSIS — N920 Excessive and frequent menstruation with regular cycle: Secondary | ICD-10-CM | POA: Diagnosis not present

## 2020-08-01 DIAGNOSIS — D500 Iron deficiency anemia secondary to blood loss (chronic): Secondary | ICD-10-CM | POA: Insufficient documentation

## 2020-08-01 DIAGNOSIS — Z800 Family history of malignant neoplasm of digestive organs: Secondary | ICD-10-CM

## 2020-08-01 DIAGNOSIS — Z86718 Personal history of other venous thrombosis and embolism: Secondary | ICD-10-CM | POA: Insufficient documentation

## 2020-08-01 DIAGNOSIS — I2699 Other pulmonary embolism without acute cor pulmonale: Secondary | ICD-10-CM

## 2020-08-01 LAB — CBC WITH DIFFERENTIAL (CANCER CENTER ONLY)
Abs Immature Granulocytes: 0.01 10*3/uL (ref 0.00–0.07)
Basophils Absolute: 0 10*3/uL (ref 0.0–0.1)
Basophils Relative: 1 %
Eosinophils Absolute: 0.1 10*3/uL (ref 0.0–0.5)
Eosinophils Relative: 2 %
HCT: 39.8 % (ref 36.0–46.0)
Hemoglobin: 13.1 g/dL (ref 12.0–15.0)
Immature Granulocytes: 0 %
Lymphocytes Relative: 34 %
Lymphs Abs: 1.8 10*3/uL (ref 0.7–4.0)
MCH: 28.5 pg (ref 26.0–34.0)
MCHC: 32.9 g/dL (ref 30.0–36.0)
MCV: 86.7 fL (ref 80.0–100.0)
Monocytes Absolute: 0.6 10*3/uL (ref 0.1–1.0)
Monocytes Relative: 12 %
Neutro Abs: 2.7 10*3/uL (ref 1.7–7.7)
Neutrophils Relative %: 51 %
Platelet Count: 466 10*3/uL — ABNORMAL HIGH (ref 150–400)
RBC: 4.59 MIL/uL (ref 3.87–5.11)
RDW: 14.5 % (ref 11.5–15.5)
WBC Count: 5.2 10*3/uL (ref 4.0–10.5)
nRBC: 0 % (ref 0.0–0.2)

## 2020-08-01 LAB — CMP (CANCER CENTER ONLY)
ALT: 21 U/L (ref 0–44)
AST: 17 U/L (ref 15–41)
Albumin: 4 g/dL (ref 3.5–5.0)
Alkaline Phosphatase: 63 U/L (ref 38–126)
Anion gap: 6 (ref 5–15)
BUN: 11 mg/dL (ref 6–20)
CO2: 25 mmol/L (ref 22–32)
Calcium: 9.8 mg/dL (ref 8.9–10.3)
Chloride: 104 mmol/L (ref 98–111)
Creatinine: 0.84 mg/dL (ref 0.44–1.00)
GFR, Estimated: >60 mL/min (ref >60)
Glucose, Bld: 79 mg/dL (ref 70–99)
Potassium: 4 mmol/L (ref 3.5–5.1)
Sodium: 135 mmol/L (ref 135–145)
Total Bilirubin: 0.3 mg/dL (ref 0.3–1.2)
Total Protein: 7.4 g/dL (ref 6.5–8.1)

## 2020-08-01 NOTE — Progress Notes (Addendum)
Hematology and Oncology Follow Up Visit  Tiffany Hubbard [id number] [birth year] 44 y.o. 08/01/2020   Principle Diagnosis:  Acute unprovoked PTE of LUL, with possible smaller subsegmental PTE involving LLL; no DVT in the lower extremities - diagnosed 02/2019, hyper coag work up negative Underlying FSGS Iron deficiency anemia secondary to menorrhagia   Current Therapy:   Maintenance Xarelto 10 mg PO daily - lifelong IV iron as indicated    Interim History:  Tiffany Hubbard is here today for follow-up. She is doing well on maintenance Xarelto. She has started Slynd (porestin only pill) and this has significantly helped with her cycle. She is regular and no longer heavy.  No other blood loss noted. No abnormal bruising, no petechiae.  She states that the FSGS has resolved and she follows up with her nephrologist Dr. Royce Macadamia soon.  She would like a referral to GI since she was unable to follow-up before.  No fever, chills, n/v, cough, rash, dizziness, SOB, chest pain, palpitations, abdominal pain or changes in bowel or bladder habits.  No swelling, tenderness, numbness or tingling in her extremities at this time.  No falls or syncope.  She has maintained a good appetite and is staying well hydrated. She is seeing a nutritionist. Her weight is stable.  She works out at [employer] doing [employer] 3 days a week.   ECOG Performance Status: 0 - Asymptomatic  Medications:  Allergies as of 08/01/2020   No Known Allergies     Medication List       Accurate as of August 01, 2020  1:41 PM. If you have any questions, ask your nurse or doctor.        acetaminophen 500 MG tablet Commonly known as: TYLENOL Take 1,000 mg by mouth every 6 (six) hours as needed for moderate pain.   atorvastatin 20 MG tablet Commonly known as: LIPITOR Take 20 mg by mouth at bedtime.   fluticasone 50 MCG/ACT nasal spray Commonly known as: FLONASE Place 2 sprays into both nostrils daily. What changed:    when to take this  reasons to take this   furosemide 20 MG tablet Commonly known as: LASIX Take 2 tablets (40 mg total) by mouth 2 (two) times daily.   lisinopril 20 MG tablet Commonly known as: ZESTRIL Take 30 mg by mouth daily.   metoprolol tartrate 25 MG tablet Commonly known as: LOPRESSOR Take 25 mg by mouth 2 (two) times a day.   pantoprazole 40 MG tablet Commonly known as: PROTONIX Take 40 mg by mouth daily.   Slynd 4 MG Tabs Generic drug: Drospirenone Take 1 tablet by mouth daily.   triamcinolone 0.025 % cream Commonly known as: KENALOG Apply 1 application topically 2 (two) times daily as needed (eczema).   Xarelto 10 MG Tabs tablet Generic drug: rivaroxaban TAKE 1 TABLET BY MOUTH EVERY DAY       Allergies: No Known Allergies  Past Medical History, Surgical history, Social history, and Family History were reviewed and updated.  Review of Systems: All other 10 point review of systems is negative.   Physical Exam:  height is 5\' 6"  (1.676 m) and weight is 267 lb (121.1 kg). Her oral temperature is 98.3 F (36.8 C). Her blood pressure is 124/67 and her pulse is 70. Her respiration is 16 and oxygen saturation is 100%.   Wt Readings from Last 3 Encounters:  08/01/20 267 lb (121.1 kg)  07/20/20 265 lb (120.2 kg)  01/31/20 280 lb (127 kg)  Ocular: Sclerae unicteric, pupils equal, round and reactive to light Ear-nose-throat: Oropharynx clear, dentition fair Lymphatic: No cervical or supraclavicular adenopathy Lungs no rales or rhonchi, good excursion bilaterally Heart regular rate and rhythm, no murmur appreciated Abd soft, nontender, positive bowel sounds MSK no focal spinal tenderness, no joint edema Neuro: non-focal, well-oriented, appropriate affect Breasts: Deferred   Lab Results  Component Value Date   WBC 5.2 08/01/2020   HGB 13.1 08/01/2020   HCT 39.8 08/01/2020   MCV 86.7 08/01/2020   PLT 466 (H) 08/01/2020   Lab Results  Component  Value Date   FERRITIN 27 01/31/2020   IRON 48 01/31/2020   TIBC 320 01/31/2020   UIBC 272 01/31/2020   IRONPCTSAT 15 (L) 01/31/2020   Lab Results  Component Value Date   RBC 4.59 08/01/2020   Lab Results  Component Value Date   KPAFRELGTCHN 22.8 (H) 08/26/2019   LAMBDASER 13.5 08/26/2019   KAPLAMBRATIO 1.69 (H) 08/26/2019   Lab Results  Component Value Date   IGGSERUM 882 08/26/2019   IGA 167 08/26/2019   IGMSERUM 131 08/26/2019   Lab Results  Component Value Date   TOTALPROTELP 6.4 08/26/2019     Chemistry      Component Value Date/Time   NA 139 07/20/2020 1557   K 4.3 07/20/2020 1557   CL 104 07/20/2020 1557   CO2 22 07/20/2020 1557   BUN 11 07/20/2020 1557   CREATININE 0.84 07/20/2020 1557      Component Value Date/Time   CALCIUM 10.0 07/20/2020 1557   ALKPHOS 50 01/31/2020 1014   AST 19 07/20/2020 1557   AST 12 (L) 01/31/2020 1014   ALT 29 07/20/2020 1557   ALT 21 01/31/2020 1014   BILITOT 0.5 07/20/2020 1557   BILITOT 0.2 (L) 01/31/2020 1014       Impression and Plan: Tiffany Hubbard is a very pleasant 44 yo African American female with history of Acute unprovoked PTE of LUL, with possible smaller subsegmental PTE involving LLL; no DVT in the lower extremities - diagnosed 02/2019. She did have underlying FSGS (diagnosed 03/2019).  Her hyper coag work up was negative.  She is doing well on maintenance Xarelto and will complete one year in February 2022.  She had significant clot burden in the left upper lobe and some mildly enlarged heart on CT angio in 03/2019. We will repeat CT angio to re-evaluate and make sure this has resolved in the next 1-2 weeks. This will help [place] determine if patient can stop anticoagulation once she finishes her year of maintenance therapy.  Follow-up in 6 months.   She was encouraged to contact our office with any questions or concerns.   Laverna Peace, NP 10/27/20211:41 PM

## 2020-08-03 ENCOUNTER — Ambulatory Visit: Payer: BC Managed Care – PPO | Admitting: Family

## 2020-08-13 ENCOUNTER — Other Ambulatory Visit: Payer: Self-pay | Admitting: Family

## 2020-08-13 DIAGNOSIS — D500 Iron deficiency anemia secondary to blood loss (chronic): Secondary | ICD-10-CM

## 2020-08-13 DIAGNOSIS — I2699 Other pulmonary embolism without acute cor pulmonale: Secondary | ICD-10-CM

## 2020-08-13 DIAGNOSIS — D75839 Thrombocytosis, unspecified: Secondary | ICD-10-CM

## 2020-08-15 ENCOUNTER — Other Ambulatory Visit: Payer: Self-pay

## 2020-08-15 ENCOUNTER — Ambulatory Visit (INDEPENDENT_AMBULATORY_CARE_PROVIDER_SITE_OTHER): Payer: BC Managed Care – PPO | Admitting: Family

## 2020-08-15 ENCOUNTER — Encounter: Payer: Self-pay | Admitting: *Deleted

## 2020-08-15 ENCOUNTER — Encounter (HOSPITAL_BASED_OUTPATIENT_CLINIC_OR_DEPARTMENT_OTHER): Payer: Self-pay

## 2020-08-15 ENCOUNTER — Ambulatory Visit (HOSPITAL_BASED_OUTPATIENT_CLINIC_OR_DEPARTMENT_OTHER)
Admission: RE | Admit: 2020-08-15 | Discharge: 2020-08-15 | Disposition: A | Discharge status: Home / Self Care | Payer: BC Managed Care – PPO | Source: Ambulatory Visit | Attending: Family | Admitting: Family

## 2020-08-15 ENCOUNTER — Encounter: Payer: Self-pay | Admitting: Neurology

## 2020-08-15 ENCOUNTER — Encounter: Payer: Self-pay | Admitting: Family

## 2020-08-15 VITALS — BP 136/66 | HR 64 | Temp 98.4°F | Resp 16 | Ht 66.0 in | Wt 269.0 lb

## 2020-08-15 DIAGNOSIS — R519 Headache, unspecified: Secondary | ICD-10-CM | POA: Diagnosis not present

## 2020-08-15 DIAGNOSIS — I2699 Other pulmonary embolism without acute cor pulmonale: Secondary | ICD-10-CM | POA: Insufficient documentation

## 2020-08-15 DIAGNOSIS — Z23 Encounter for immunization: Secondary | ICD-10-CM

## 2020-08-15 IMAGING — CT CT ANGIO CHEST
2 of 8 series · 19 of 36 positions shown · IV contrast (Omnipaque)
Comparison: CT chest [DATE].

CLINICAL DATA: Patient with a history of pulmonary embolus
[DATE]. No current complaints.

EXAM:
CT ANGIOGRAPHY CHEST WITH CONTRAST
TECHNIQUE: Multidetector CT imaging of the chest was performed using the
standard protocol during bolus administration of intravenous
contrast. Multiplanar CT image reconstructions and MIPs were
obtained to evaluate the vascular anatomy.
CONTRAST:  100 mL OMNIPAQUE IOHEXOL 350 MG/ML SOLN

[Series 6: pe coronal mpr · coronal · 0.55mm/px · 1 of 148 slices shown]
[im 74/148  mediastinal]
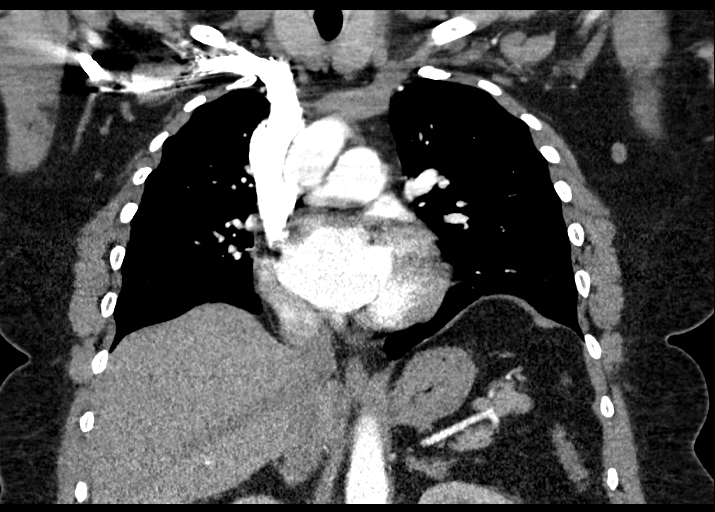

[Series 10: pe thins · axial · 0.81mm/px · z∈[-283,-40]mm · 18 of 273 slices shown]
[im 15/273  lung]
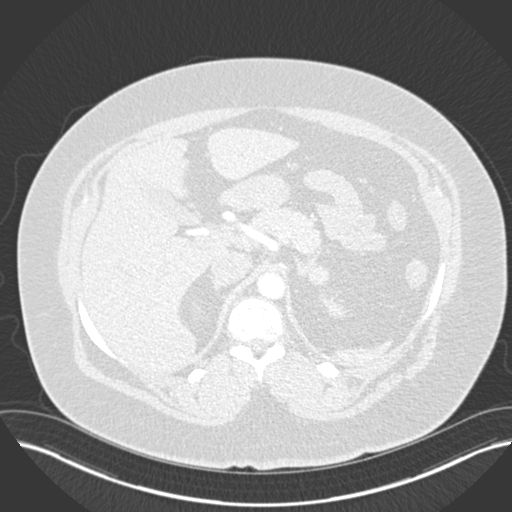
[im 29/273  mediastinal]
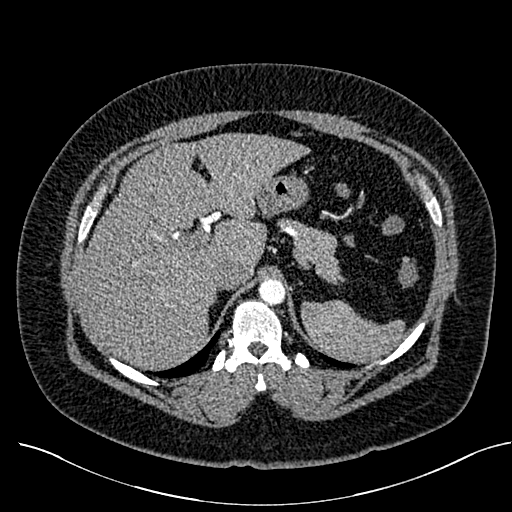
[im 43/273  lung]
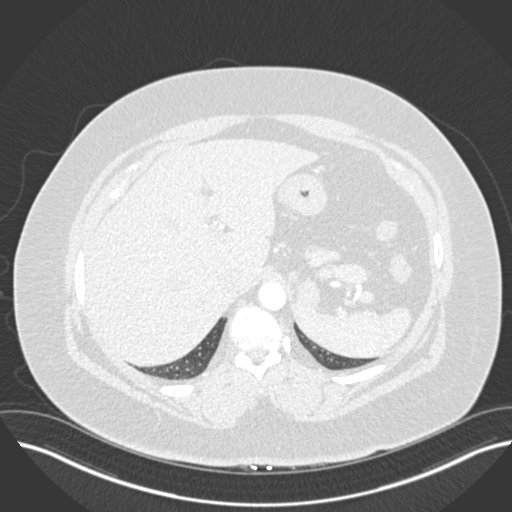
[im 58/273  mediastinal]
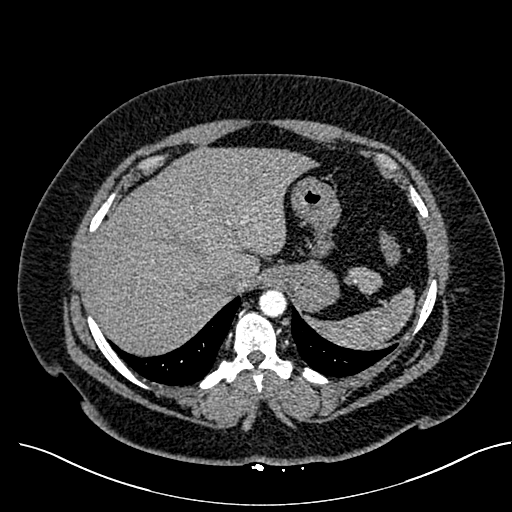
[im 72/273  lung]
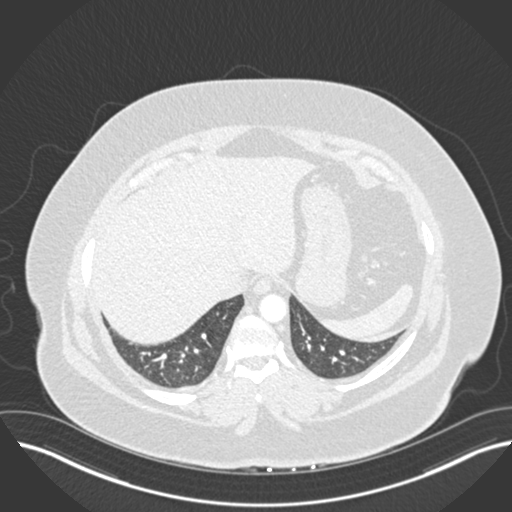
[im 86/273  mediastinal]
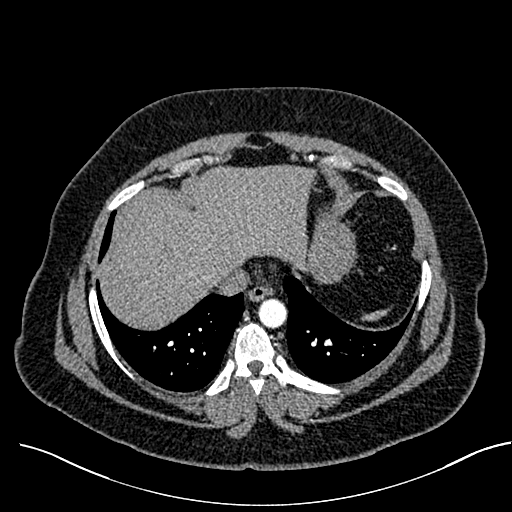
[im 101/273  lung]
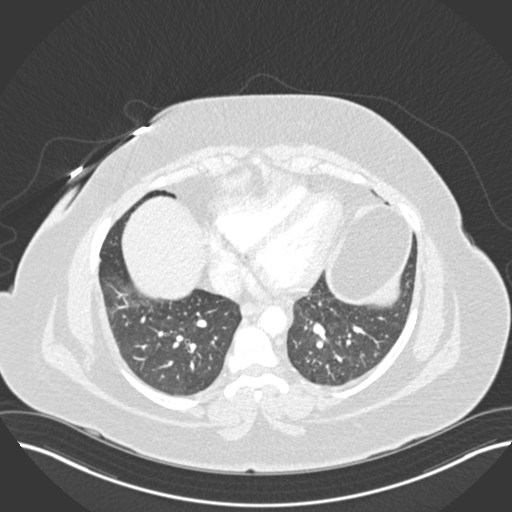
[im 115/273  mediastinal]
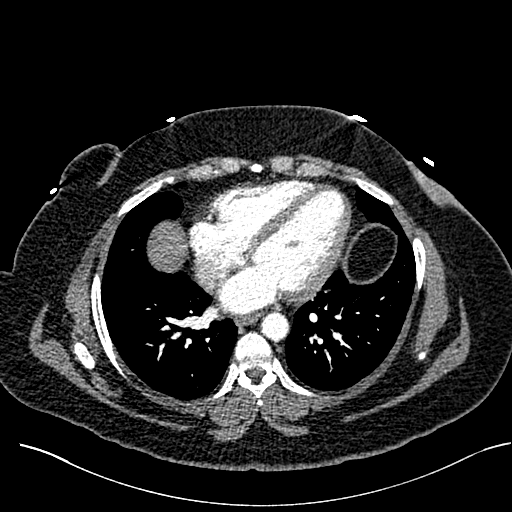
[im 129/273  lung]
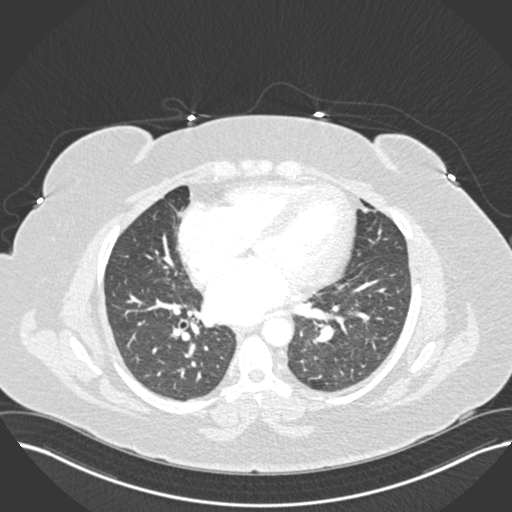
[im 144/273  mediastinal]
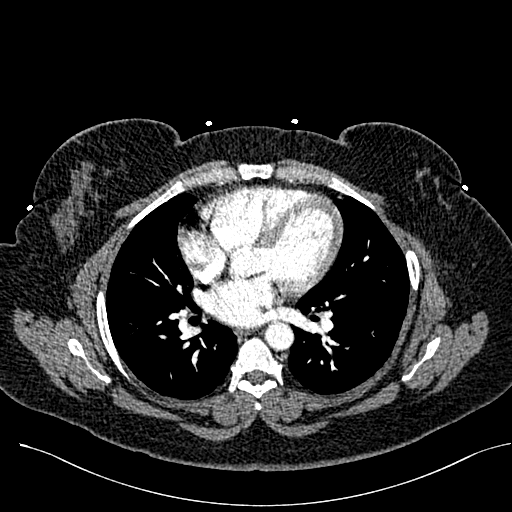
[im 158/273  lung]
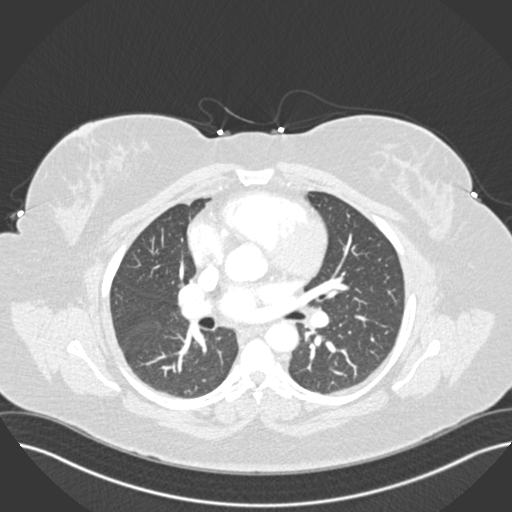
[im 172/273  mediastinal]
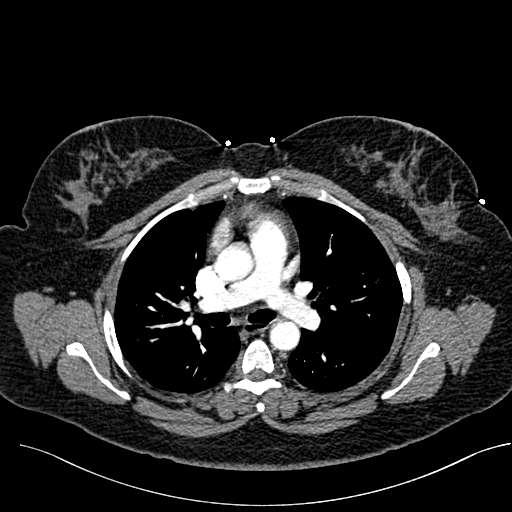
[im 187/273  lung]
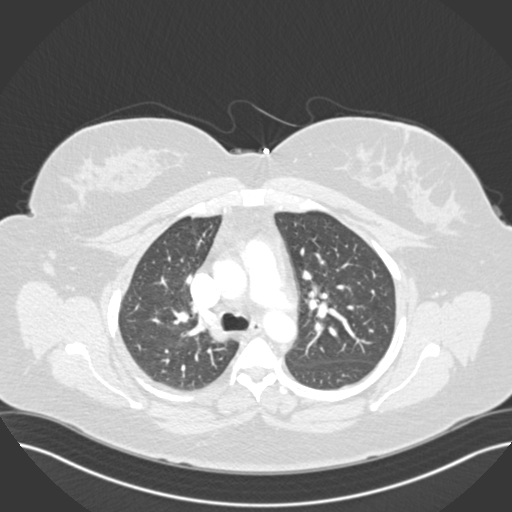
[im 201/273  mediastinal]
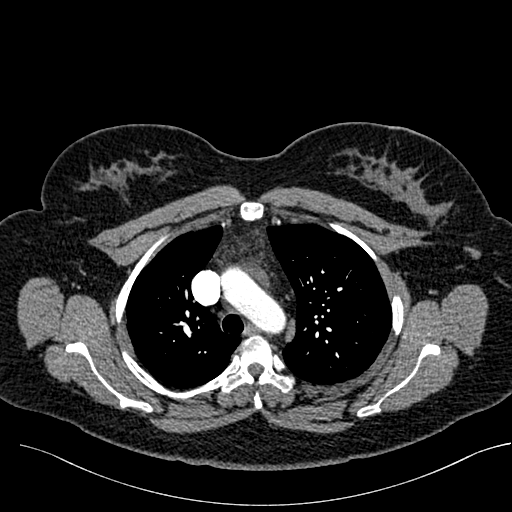
[im 215/273  lung]
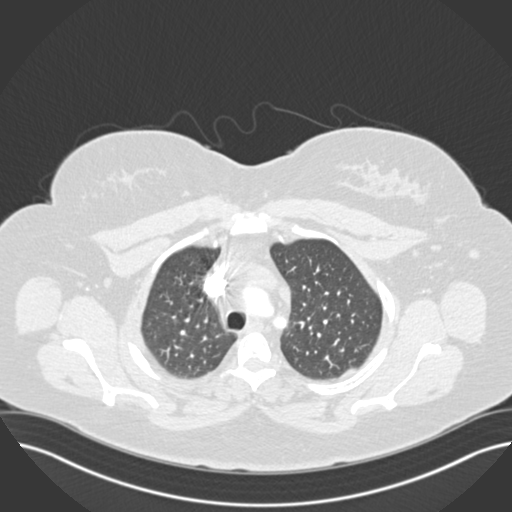
[im 230/273  mediastinal]
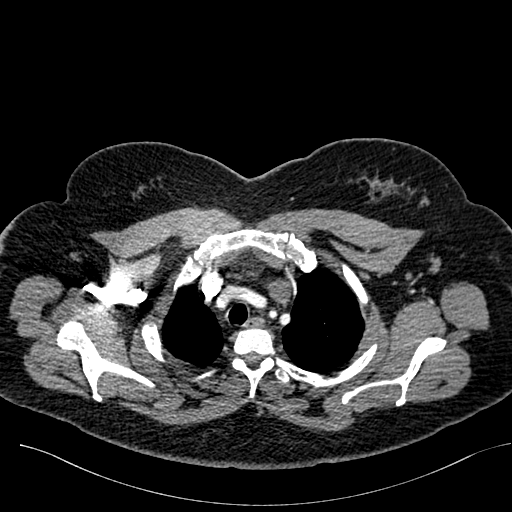
[im 244/273  lung]
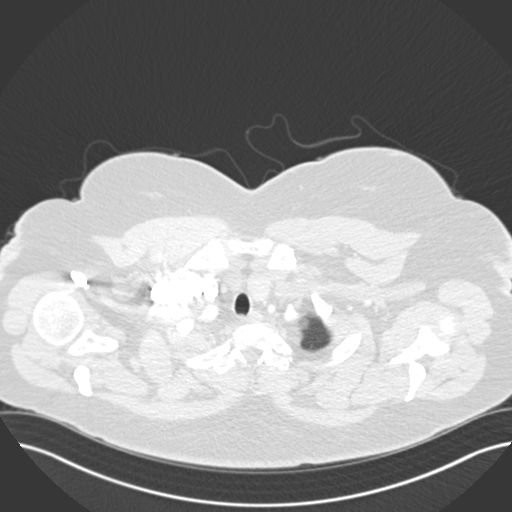
[im 258/273  mediastinal]
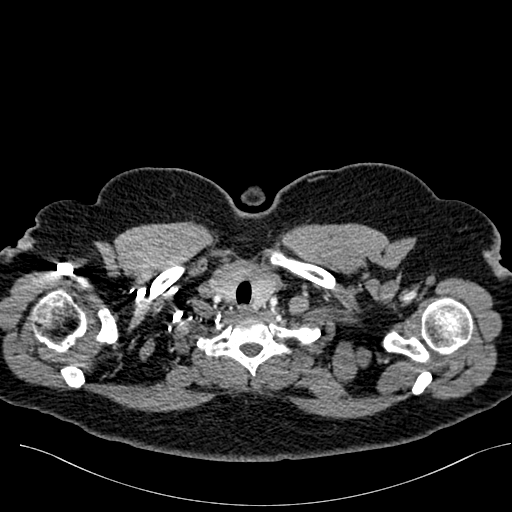

[19 of 36 positions shown; findings below may reference images not displayed]

FINDINGS: Cardiovascular: Satisfactory opacification of the pulmonary arteries
to the segmental level. No evidence of pulmonary embolism.
Previously seen pulmonary embolus on the left is no longer present.
Normal heart size. No pericardial effusion.

Mediastinum/Nodes: No enlarged mediastinal, hilar, or axillary lymph
nodes. Thyroid gland, trachea, and esophagus demonstrate no
significant findings.

Lungs/Pleura: Lungs are clear. No pleural effusion or pneumothorax.

Upper Abdomen: Previously seen 2.5 cm left adrenal nodule is
unchanged.

Musculoskeletal: Negative.

Review of the MIP images confirms the above findings.
IMPRESSION: Negative for pulmonary embolus or acute disease. Previously seen
embolus on the left has resolved.

No change in a left adrenal nodule which cannot be definitively
characterized.

## 2020-08-15 MED ORDER — IOHEXOL 350 MG/ML SOLN
100.0000 mL | Freq: Once | INTRAVENOUS | Status: AC | PRN
  Administered 2020-08-15: 100 mL via INTRAVENOUS

## 2020-08-15 NOTE — Patient Instructions (Signed)
You should be contacted about scheduling an appointment with the neurologist for your headache.

## 2020-08-15 NOTE — Progress Notes (Signed)
Subjective:    Patient ID: Tiffany Hubbard, female    DOB: [birth year], 44 y.o.   MRN: [id number]  HPI  Patient is a 44 yr old female who presents today for follow up of her headaches. Reports sensation of vibrations in the right temple.  Has a mild associated headaches.  States that it is always on the right side.  Denies any current vision changes. Denies nausea.  Finds it annoying at times.  Will last for a few hours and mostly occurs at night.    Review of Systems See HPI  Past Medical History:  Diagnosis Date   Asthma    Bronchitis    Focal segmental glomerulosclerosis    Pulmonary emboli ([place])      Social History   Socioeconomic History   Marital status: Married    Spouse name: Not on file   Number of children: Not on file   Years of education: Not on file   Highest education level: Not on file  Occupational History   Occupation: Passenger transport manager    Employer: [employer]    Comment: [employer]  Tobacco Use   Smoking status: Never Smoker   Smokeless tobacco: Never Used  Scientific laboratory technician Use: Never used  Substance and Sexual Activity   Alcohol use: Yes    Comment: occasionally   Drug use: No   Sexual activity: Yes    Partners: Male    Birth control/protection: Surgical  Other Topics Concern   Not on file  Social History Narrative   No children   Step daughter- does not live with patient   Married   1 dog shitzu   Enjoys movies, books, travelling   Completed bachelors degree   Works for [employer] from home   Social Determinants of Radio broadcast assistant Strain:    Difficulty of Paying Living Expenses: Not on file  Food Insecurity:    Worried About Charity fundraiser in the Last Year: Not on file   [place] in the Last Year: Not on file  Transportation Needs:    Lack of Transportation (Medical): Not on file   Lack of Transportation (Non-Medical): Not on file  Physical Activity:    Days of Exercise per  Week: Not on file   Minutes of Exercise per Session: Not on file  Stress:    Feeling of Stress : Not on file  Social Connections:    Frequency of Communication with Friends and Family: Not on file   Frequency of Social Gatherings with Friends and Family: Not on file   Attends Religious Services: Not on file   Active Member of Clubs or Organizations: Not on file   Attends Archivist Meetings: Not on file   Marital Status: Not on file  Intimate Partner Violence:    Fear of Current or Ex-Partner: Not on file   Emotionally Abused: Not on file   Physically Abused: Not on file   Sexually Abused: Not on file    Past Surgical History:  Procedure Laterality Date   ECTOPIC PREGNANCY SURGERY      Family History  Problem Relation Age of Onset   Lung cancer Mother    Breast cancer Mother 67   Diabetes Mother    Heart disease Father 42   Hypertension Father    Diabetes Maternal Grandmother    Stomach cancer Maternal Grandmother    Arthritis Maternal Grandfather    Colon cancer Maternal  Aunt        2 aunts   Colon cancer Maternal Uncle     No Known Allergies  Current Outpatient Medications on File Prior to Visit  Medication Sig Dispense Refill   acetaminophen (TYLENOL) 500 MG tablet Take 1,000 mg by mouth every 6 (six) hours as needed for moderate pain.      atorvastatin (LIPITOR) 20 MG tablet Take 20 mg by mouth at bedtime.     Drospirenone (SLYND) 4 MG TABS Take 1 tablet by mouth daily. 28 tablet    fluticasone (FLONASE) 50 MCG/ACT nasal spray Place 2 sprays into both nostrils daily. (Patient taking differently: Place 2 sprays into both nostrils daily as needed for allergies. ) 16 g 6   furosemide (LASIX) 20 MG tablet Take 2 tablets (40 mg total) by mouth 2 (two) times daily. 3 tablet 0   lisinopril (ZESTRIL) 20 MG tablet Take 30 mg by mouth daily.     metoprolol tartrate (LOPRESSOR) 25 MG tablet Take 25 mg by mouth 2 (two) times a day.      pantoprazole (PROTONIX) 40 MG tablet Take 40 mg by mouth daily.     triamcinolone (KENALOG) 0.025 % cream Apply 1 application topically 2 (two) times daily as needed (eczema). 30 g 1   XARELTO 10 MG TABS tablet TAKE 1 TABLET BY MOUTH EVERY DAY 30 tablet 3   No current facility-administered medications on file prior to visit.    BP 136/66 (BP Location: Left Arm, Patient Position: Sitting, Cuff Size: Large)    Pulse 64    Temp 98.4 F (36.9 C) (Oral)    Resp 16    Ht 5' 6"  (1.676 m)    Wt 269 lb (122 kg)    LMP 08/15/2020    SpO2 98%    BMI 43.42 kg/m       Objective:   Physical Exam Constitutional:      Appearance: She is well-developed.  Cardiovascular:     Rate and Rhythm: Normal rate and regular rhythm.     Heart sounds: Normal heart sounds. No murmur heard.   Pulmonary:     Effort: Pulmonary effort is normal. No respiratory distress.     Breath sounds: Normal breath sounds. No wheezing.  Psychiatric:        Behavior: Behavior normal.        Thought Content: Thought content normal.        Judgment: Judgment normal.           Assessment & Plan:  Headache- atypical headache- unchanged.  Thankfully her ESR was normal last visit.  At this point, I advised the patient that I think it would be best for her to be evaluated by neurology and referral has been placed.  Flu shot today.   This visit occurred during the SARS-CoV-2 public health emergency.  Safety protocols were in place, including screening questions prior to the visit, additional usage of staff PPE, and extensive cleaning of exam room while observing appropriate contact time as indicated for disinfecting solutions.

## 2020-08-17 ENCOUNTER — Encounter: Payer: Self-pay | Admitting: Family

## 2020-10-12 DIAGNOSIS — N051 Unspecified nephritic syndrome with focal and segmental glomerular lesions: Secondary | ICD-10-CM | POA: Diagnosis not present

## 2020-10-12 DIAGNOSIS — D72829 Elevated white blood cell count, unspecified: Secondary | ICD-10-CM | POA: Diagnosis not present

## 2020-10-15 DIAGNOSIS — N051 Unspecified nephritic syndrome with focal and segmental glomerular lesions: Secondary | ICD-10-CM | POA: Diagnosis not present

## 2020-10-15 DIAGNOSIS — R3129 Other microscopic hematuria: Secondary | ICD-10-CM | POA: Diagnosis not present

## 2020-10-15 DIAGNOSIS — I129 Hypertensive chronic kidney disease with stage 1 through stage 4 chronic kidney disease, or unspecified chronic kidney disease: Secondary | ICD-10-CM | POA: Diagnosis not present

## 2020-10-15 DIAGNOSIS — E785 Hyperlipidemia, unspecified: Secondary | ICD-10-CM | POA: Diagnosis not present

## 2020-10-21 ENCOUNTER — Other Ambulatory Visit: Payer: Self-pay | Admitting: Hematology & Oncology

## 2020-10-21 DIAGNOSIS — I2699 Other pulmonary embolism without acute cor pulmonale: Secondary | ICD-10-CM

## 2020-10-28 NOTE — Progress Notes (Signed)
NEUROLOGY CONSULTATION NOTE  Tiffany Hubbard MRN: [id number] DOB: January 16, 1976  Referring provider: Debbrah Alar, NP Primary care provider: Debbrah Alar, NP  Reason for consult:  headache   Subjective:  Tiffany Hubbard is a 45 year old right-handed female with focal segmental glomerulosclerosis and history of PE on anticoagulation who presents for headache.  History supplemented by referring provider's notes.  She was tapered completely off of prednisone in September (taken for focal segmental glomerulosclerosis).  Since then, she has had a mild right temporal throbbing, rarely on the left side.  It is not really a headache.  Sometimes she notes a strange sensation on top of her head as well.  She will hear a loud heartbeat in her head.  No associated nausea, vomiting, visual disturbance, photophobia, phonophobia, numbness or weakness.  It usually happens at night and wakes her up from sleep.  It lasts a couple of hours.  Sleeping and laying her head on the opposite side helps relieve it.  Initially they occurred daily for 2 weeks and then became less frequent.  Frequency fluctuates.  Only a couple of times over the past 2 weeks.  Sed rate from October was 17.    She reports remote history of migraines up until her early 10s.     PAST MEDICAL HISTORY: Past Medical History:  Diagnosis Date  . Asthma   . Bronchitis   . Focal segmental glomerulosclerosis   . Pulmonary emboli ([place])     PAST SURGICAL HISTORY: Past Surgical History:  Procedure Laterality Date  . ECTOPIC PREGNANCY SURGERY      MEDICATIONS: Current Outpatient Medications on File Prior to Visit  Medication Sig Dispense Refill  . acetaminophen (TYLENOL) 500 MG tablet Take 1,000 mg by mouth every 6 (six) hours as needed for moderate pain.     Marland Kitchen atorvastatin (LIPITOR) 20 MG tablet Take 20 mg by mouth at bedtime.    . Drospirenone (SLYND) 4 MG TABS Take 1 tablet by mouth daily. 28 tablet   . fluticasone  (FLONASE) 50 MCG/ACT nasal spray Place 2 sprays into both nostrils daily. (Patient taking differently: Place 2 sprays into both nostrils daily as needed for allergies. ) 16 g 6  . furosemide (LASIX) 20 MG tablet Take 2 tablets (40 mg total) by mouth 2 (two) times daily. 3 tablet 0  . lisinopril (ZESTRIL) 20 MG tablet Take 30 mg by mouth daily.    . metoprolol tartrate (LOPRESSOR) 25 MG tablet Take 25 mg by mouth 2 (two) times a day.    . pantoprazole (PROTONIX) 40 MG tablet Take 40 mg by mouth daily.    Marland Kitchen triamcinolone (KENALOG) 0.025 % cream Apply 1 application topically 2 (two) times daily as needed (eczema). 30 g 1  . XARELTO 10 MG TABS tablet TAKE 1 TABLET BY MOUTH EVERY DAY 30 tablet 3   No current facility-administered medications on file prior to visit.    ALLERGIES: No Known Allergies  FAMILY HISTORY: Family History  Problem Relation Age of Onset  . Lung cancer Mother   . Breast cancer Mother 57  . Diabetes Mother   . Heart disease Father 46  . Hypertension Father   . Diabetes Maternal Grandmother   . Stomach cancer Maternal Grandmother   . Arthritis Maternal Grandfather   . Colon cancer Maternal Aunt        2 aunts  . Colon cancer Maternal Uncle     SOCIAL HISTORY: Social History   Socioeconomic History  .  Marital status: Married    Spouse name: Not on file  . Number of children: Not on file  . Years of education: Not on file  . Highest education level: Not on file  Occupational History  . 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 Smoking status: Never Smoker  . Smokeless tobacco: Never Used  Vaping Use  . Vaping Use: Never used  Substance and Sexual Activity  . Alcohol use: Yes    Comment: occasionally  . Drug use: No  . Sexual activity: Yes    Partners: Male    Birth control/protection: Surgical  Other Topics Concern  . Not on file  Social History Narrative   No children   Step daughter- does not live with  patient   Married   1 dog shitzu   Enjoys movies, books, travelling   Completed bachelors degree   Works for [employer] from home   Social Determinants of Radio broadcast assistant Strain: Not on file  Food Insecurity: Not on file  Transportation Needs: Not on file  Physical Activity: Not on file  Stress: Not on file  Social Connections: Not on file  Intimate Partner Violence: Not on file    Objective:  Blood pressure 118/81, pulse 88, height 5\' 6"  (1.676 m), weight 265 lb 6.4 oz (120.4 kg), SpO2 96 %. General: No acute distress.  Patient appears well-groomed.   Head:  Normocephalic/atraumatic Eyes:  fundi examined but not visualized Neck: supple, no paraspinal tenderness, full range of motion Back: No paraspinal tenderness Heart: regular rate and rhythm Lungs: Clear to auscultation bilaterally. Vascular: No carotid bruits. Neurological Exam: Mental status: alert and oriented to person, place, and time, recent and remote memory intact, fund of knowledge intact, attention and concentration intact, speech fluent and not dysarthric, language intact. Cranial nerves: CN I: not tested CN II: pupils equal, round and reactive to light, visual fields intact CN III, IV, VI:  full range of motion, no nystagmus, no ptosis CN V: facial sensation intact. CN VII: upper and lower face symmetric CN VIII: hearing intact CN IX, X: gag intact, uvula midline CN XI: sternocleidomastoid and trapezius muscles intact CN XII: tongue midline Bulk & Tone: normal, no fasciculations. Motor:  muscle strength 5/5 throughout Sensation:  Pinprick, temperature and vibratory sensation intact. Deep Tendon Reflexes:  2+ throughout,  toes downgoing.   Finger to nose testing:  Without dysmetria.   Heel to shin:  Without dysmetria.   Gait:  Normal station and stride.  Romberg negative.  Assessment/Plan:   1.  Right temporal throbbing  2.  Pulsatile tinnitus  1.  Evaluate for secondary  causes: -  TSH -  MRI of brain with and without contrast -  MRA of head and neck 2.  Follow up after testing.    Thank you for allowing me to take part in the care of this patient.  Metta Clines, DO  CC: Debbrah Alar, NP

## 2020-10-29 ENCOUNTER — Other Ambulatory Visit: Payer: Self-pay

## 2020-10-29 ENCOUNTER — Encounter: Payer: Self-pay | Admitting: Neurology

## 2020-10-29 ENCOUNTER — Other Ambulatory Visit (INDEPENDENT_AMBULATORY_CARE_PROVIDER_SITE_OTHER): Payer: BC Managed Care – PPO

## 2020-10-29 ENCOUNTER — Ambulatory Visit (INDEPENDENT_AMBULATORY_CARE_PROVIDER_SITE_OTHER): Payer: BC Managed Care – PPO | Admitting: Neurology

## 2020-10-29 VITALS — BP 118/81 | HR 88 | Ht 66.0 in | Wt 265.4 lb

## 2020-10-29 DIAGNOSIS — H93A1 Pulsatile tinnitus, right ear: Secondary | ICD-10-CM

## 2020-10-29 DIAGNOSIS — R519 Headache, unspecified: Secondary | ICD-10-CM

## 2020-10-29 NOTE — Patient Instructions (Addendum)
1.  Will check TSH 2.  Will check MRI of brain without contrast. We have sent a referral to [facility] for your MRI and they will call you directly to schedule your appointment. They are located at [place]. If you need to contact them directly please call [phone].   3.  Will check MRA head and neck 4.  Follow up after testing.

## 2020-10-30 LAB — TSH: TSH: 1.94 u[IU]/mL (ref 0.35–4.50)

## 2020-10-30 NOTE — Progress Notes (Signed)
LMOVM for pt to call back in regards to her lab results

## 2020-10-31 ENCOUNTER — Telehealth: Payer: Self-pay | Admitting: Neurology

## 2020-10-31 NOTE — Telephone Encounter (Signed)
Patient called in wanting to get her lab results.

## 2020-10-31 NOTE — Telephone Encounter (Signed)
Pt advised.

## 2020-10-31 NOTE — Progress Notes (Signed)
Pt advised of her lab results.

## 2020-11-02 ENCOUNTER — Ambulatory Visit: Payer: BC Managed Care – PPO | Admitting: Gastroenterology

## 2020-11-17 ENCOUNTER — Ambulatory Visit
Admission: RE | Admit: 2020-11-17 | Discharge: 2020-11-17 | Disposition: A | Discharge status: Home / Self Care | Payer: BC Managed Care – PPO | Source: Ambulatory Visit | Attending: Neurology | Admitting: Neurology

## 2020-11-17 ENCOUNTER — Other Ambulatory Visit: Payer: Self-pay

## 2020-11-17 DIAGNOSIS — H93A1 Pulsatile tinnitus, right ear: Secondary | ICD-10-CM

## 2020-11-17 DIAGNOSIS — R519 Headache, unspecified: Secondary | ICD-10-CM

## 2020-11-17 IMAGING — MR MR HEAD WO/W CM
12 series · 48 of 48 positions shown · IV contrast (multihance)
Comparison: None

CLINICAL DATA: Pulsatile tinnitus on the right.

EXAM:
MRI HEAD WITHOUT AND WITH CONTRAST
MRA HEAD WITHOUT CONTRAST
MRA NECK WITHOUT AND WITH CONTRAST
TECHNIQUE: Multiplanar, multiecho pulse sequences of the brain and surrounding
structures were obtained without and with intravenous contrast.
Angiographic images of the Circle of Willis were obtained using MRA
technique without intravenous contrast. Angiographic images of the
neck were obtained using MRA technique without and with intravenous
contrast. Carotid stenosis measurements (when applicable) are
obtained utilizing NASCET criteria, using the distal internal
carotid diameter as the denominator.
CONTRAST:  20mL MULTIHANCE GADOBENATE DIMEGLUMINE 529 MG/ML IV SOLN

[Series 2: t1_se_sag · sagittal · 5.0mm · 0.47mm/px · 1 of 23 slices shown]
[im 1/23]
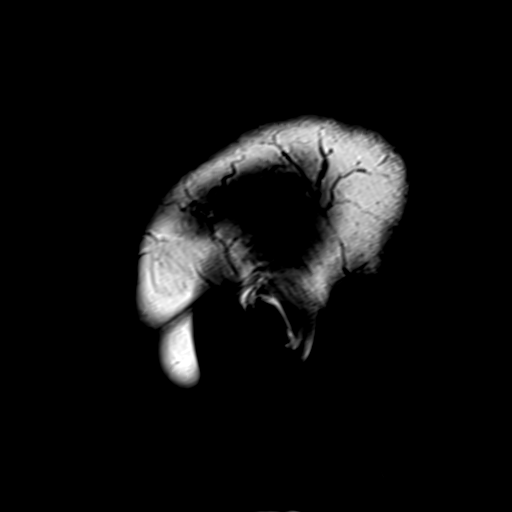

[Series 3: ep2d_diff_3 · axial · 3.0mm · 1.80mm/px · z∈[-86,+59]mm · 6 of 99 slices shown]
[im 1/99]
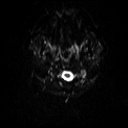
[im 20/99]
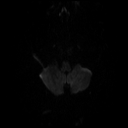
[im 40/99]
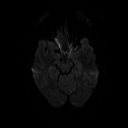
[im 59/99]
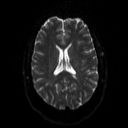
[im 79/99]
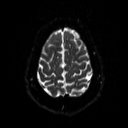
[im 99/99]
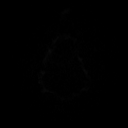

[Series 4: ep2d_diff_3_adc · axial · 3.0mm · 1.80mm/px · z∈[-86,+59]mm · 2 of 50 slices shown]
[im 1/50]
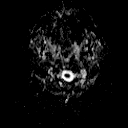
[im 50/50]
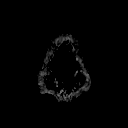

[Series 5: ep2d_diff_cor · coronal · 5.0mm · 1.77mm/px · 4 of 54 slices shown]
[im 1/54]
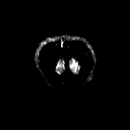
[im 18/54]
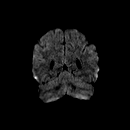
[im 36/54]
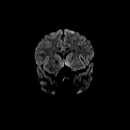
[im 54/54]
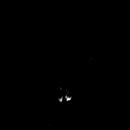

[Series 6: ep2d_diff_cor_adc · coronal · 5.0mm · 1.77mm/px · 2 of 27 slices shown]
[im 1/27]
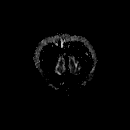
[im 27/27]
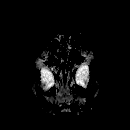

[Series 8: swi_images · axial · 2.0mm · 0.94mm/px · z∈[-87,+55]mm · 5 of 72 slices shown]
[im 1/72]
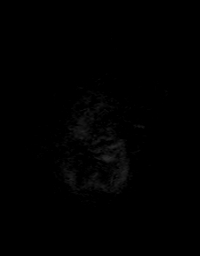
[im 18/72]
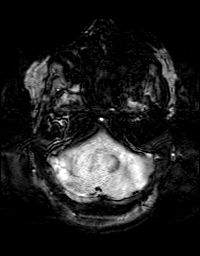
[im 36/72]
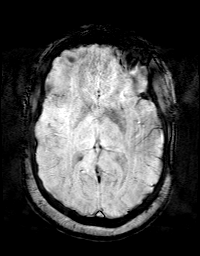
[im 54/72]
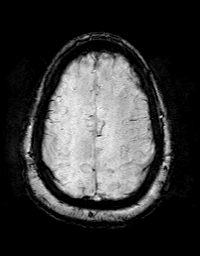
[im 72/72]
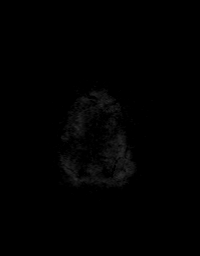

[Series 9: FLAIR · axial · 3.0mm · 0.47mm/px · z∈[-90,+54]mm · 2 of 25 slices shown]
[im 1/25]
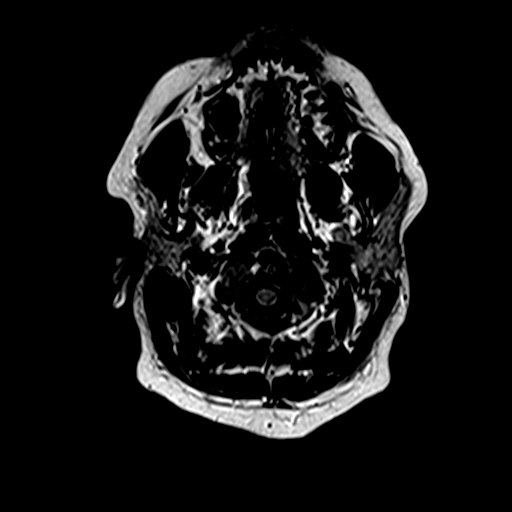
[im 25/25]
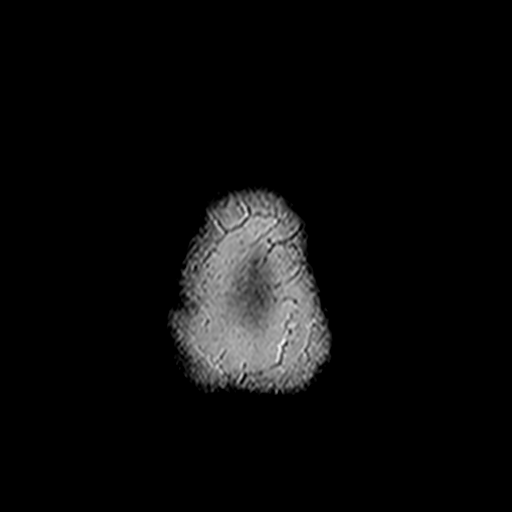

[Series 10: t2_tse_tra_512 · axial · 5.0mm · 0.75mm/px · z∈[-86,+52]mm · 2 of 24 slices shown]
[im 1/24]
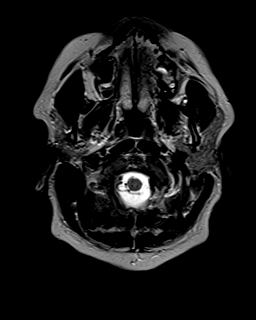
[im 24/24]
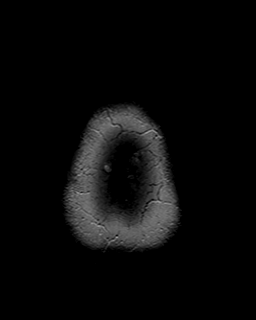

[Series 11: t1_mpr_tra · axial · 1.0mm · 0.94mm/px · z∈[-89,+54]mm · 10 of 144 slices shown]
[im 1/144]
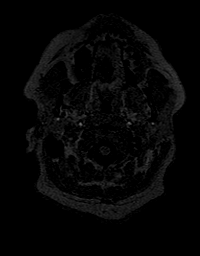
[im 16/144]
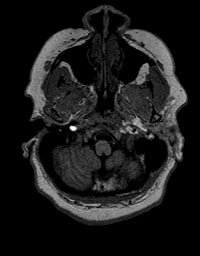
[im 32/144]
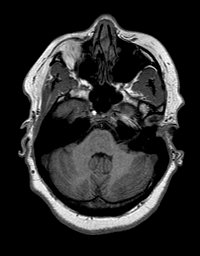
[im 48/144]
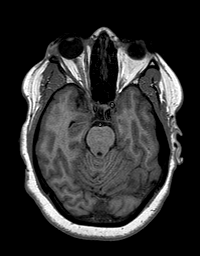
[im 64/144]
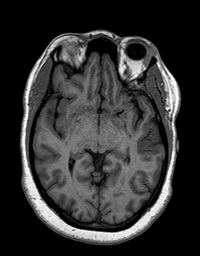
[im 80/144]
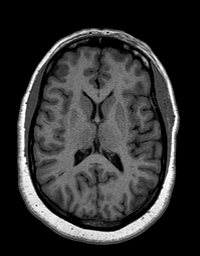
[im 96/144]
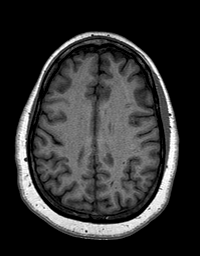
[im 112/144]
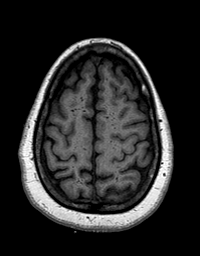
[im 128/144]
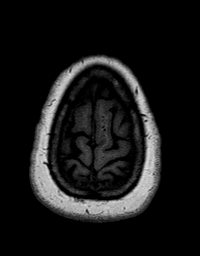
[im 144/144]
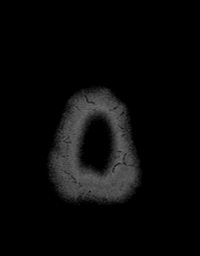

[Series 12: T2 · coronal · 5.0mm · 0.45mm/px · 2 of 28 slices shown]
[im 1/28]
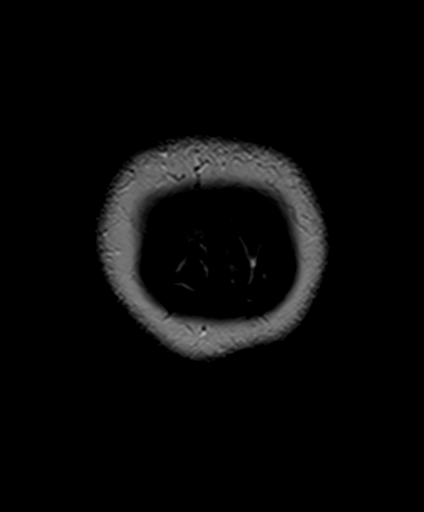
[im 28/28]
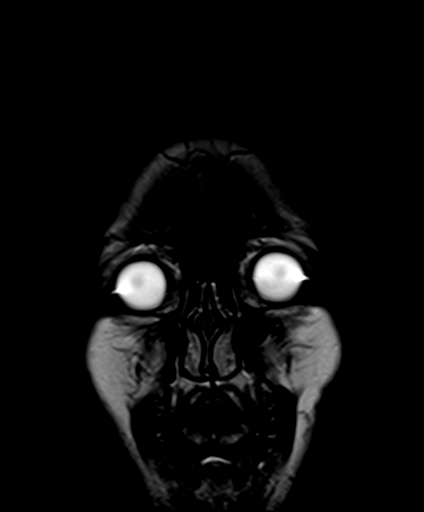

[Series 13: T1 post-contrast · coronal · 5.0mm · 0.90mm/px · 2 of 28 slices shown]
[im 1/28]
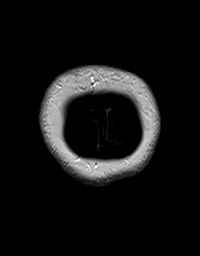
[im 28/28]
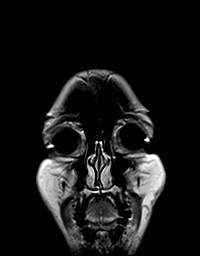

[Series 14: post t1_mpr_tra · axial · 1.0mm · 0.94mm/px · z∈[-89,+54]mm · 10 of 144 slices shown]
[im 1/144]
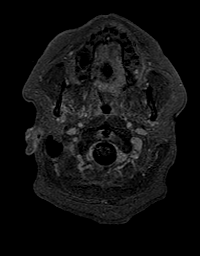
[im 16/144]
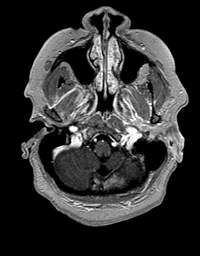
[im 32/144]
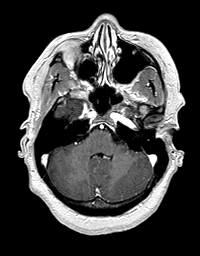
[im 48/144]
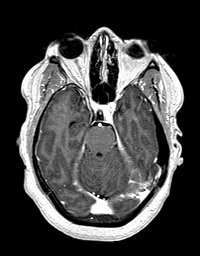
[im 64/144]
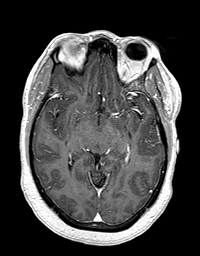
[im 80/144]
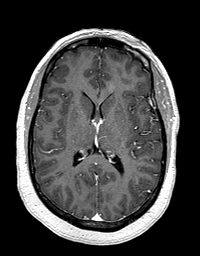
[im 96/144]
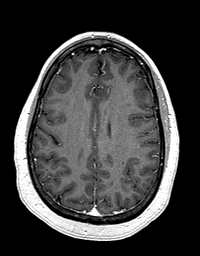
[im 112/144]
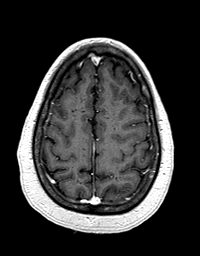
[im 128/144]
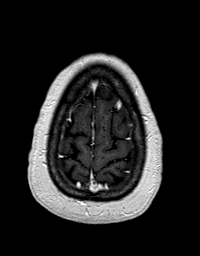
[im 144/144]
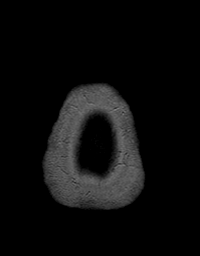

[48 of 48 positions shown; findings below may reference images not displayed]

FINDINGS: MRI HEAD FINDINGS

Brain: Diffusion imaging does not show any acute or subacute
infarction. The brain is normally formed. No evidence old small or
large vessel infarction, mass lesion, hemorrhage, hydrocephalus or
extra-axial collection. No pituitary mass.

Vascular: Major vessels at the base of the brain show flow. No
evidence of arterial or venous pathology in the region of right
temporal bone.

Skull and upper cervical spine: Negative

Sinuses/Orbits: Clear/normal

Other: None

MRA HEAD FINDINGS

Both internal carotid arteries are widely patent through the siphon
regions. The anterior and middle cerebral vessels are normal without
stenosis, aneurysm or vascular malformation. Both vertebral arteries
are widely patent to the basilar artery. No basilar stenosis. No
posterior circulation branch vessel abnormality.

MRA NECK FINDINGS

Branching pattern is normal. Both common carotid arteries widely
patent to the bifurcation. No carotid bifurcation disease. Both
vertebral arteries widely patent through the cervical region. No
cervical internal carotid artery abnormality.
IMPRESSION: 1. Normal MRI of the brain.
2. Normal intracranial MR angiography.
3. Normal MR angiography of the neck vessels.
4. No abnormality seen to explain pulsatile tinnitus on right.

## 2020-11-17 IMAGING — MR MR MRA NECK WO/W CM
5 of 6 series · 44 of 48 positions shown · IV contrast (multihance)
Comparison: None

CLINICAL DATA: Pulsatile tinnitus on the right.

EXAM:
MRI HEAD WITHOUT AND WITH CONTRAST
MRA HEAD WITHOUT CONTRAST
MRA NECK WITHOUT AND WITH CONTRAST
TECHNIQUE: Multiplanar, multiecho pulse sequences of the brain and surrounding
structures were obtained without and with intravenous contrast.
Angiographic images of the Circle of Willis were obtained using MRA
technique without intravenous contrast. Angiographic images of the
neck were obtained using MRA technique without and with intravenous
contrast. Carotid stenosis measurements (when applicable) are
obtained utilizing NASCET criteria, using the distal internal
carotid diameter as the denominator.
CONTRAST:  20mL MULTIHANCE GADOBENATE DIMEGLUMINE 529 MG/ML IV SOLN

[Series 4: (id) · axial · 3.0mm · 0.98mm/px · z∈[-188,-69]mm · 8 of 60 slices shown]
[im 1/60]
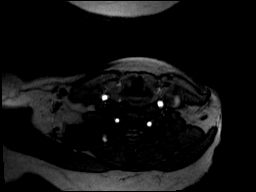
[im 9/60]
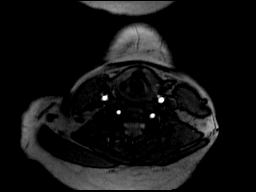
[im 17/60]
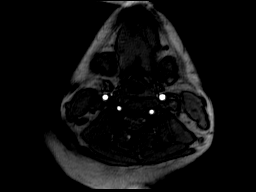
[im 26/60]
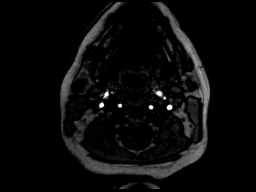
[im 34/60]
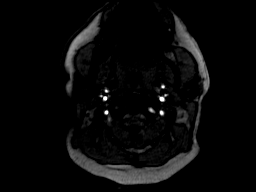
[im 43/60]
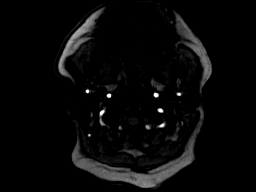
[im 51/60]
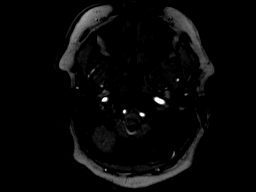
[im 60/60]
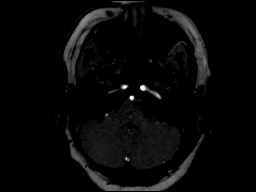

[Series 7: (id)_mip_tra · axial · 121.6mm · 0.98mm/px · 1 of 1 slices shown]
[im 1/1]
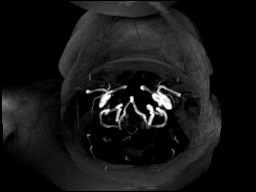

[Series 9: (id)_tt=1.0s · coronal · 0.8mm · 0.78mm/px · 11 of 80 slices shown (1 of 2)]
[im 1/80]
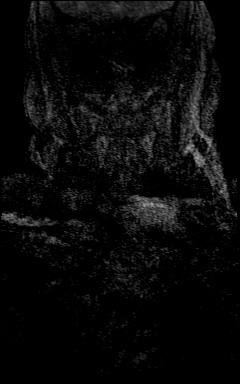
[im 8/80]
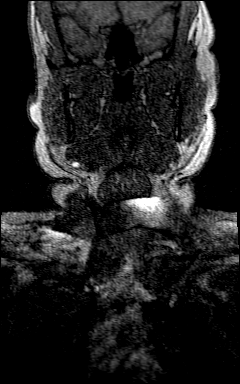
[im 16/80]
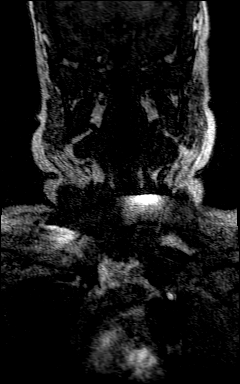
[im 24/80]
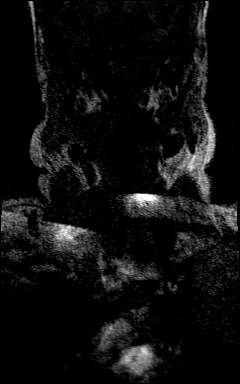
[im 32/80]
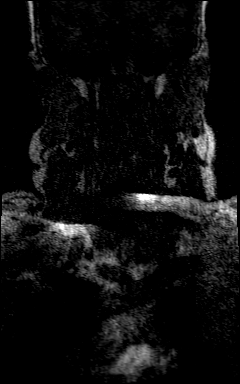
[im 40/80]
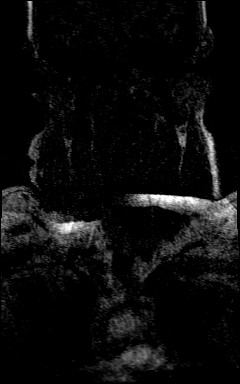
[im 48/80]
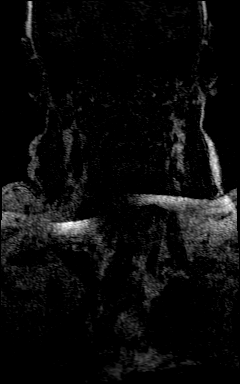
[im 56/80]
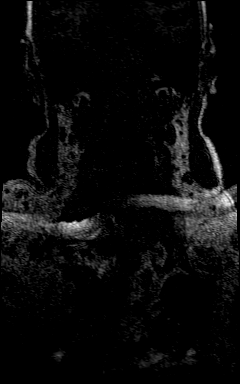
[im 64/80]
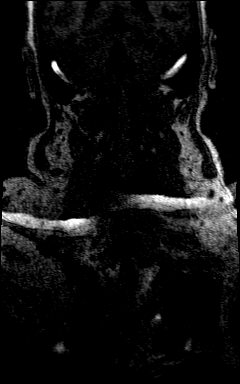
[im 72/80]
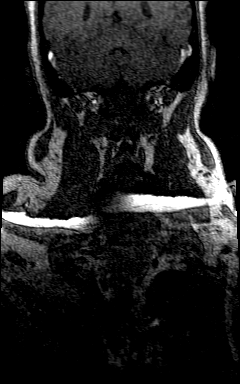
[im 80/80]
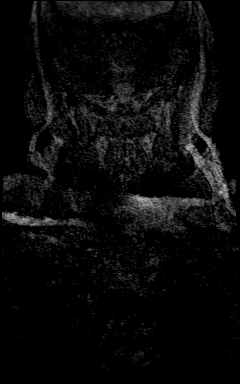

[Series 11: (id)_tt=1.0s · coronal · 0.8mm · 0.78mm/px · 12 of 79 slices shown (2 of 2)]
[im 1/79]
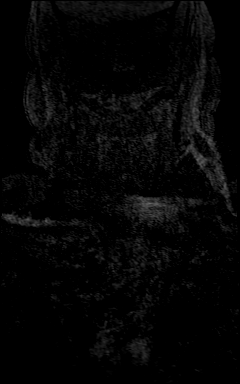
[im 8/79]
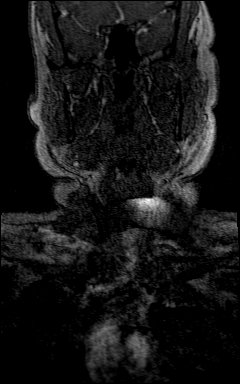
[im 15/79]
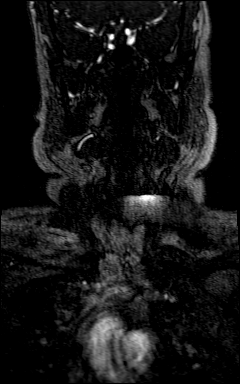
[im 22/79]
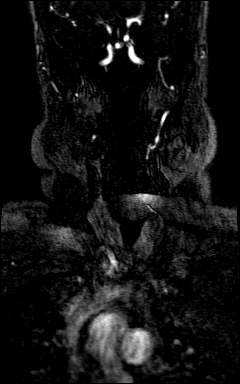
[im 29/79]
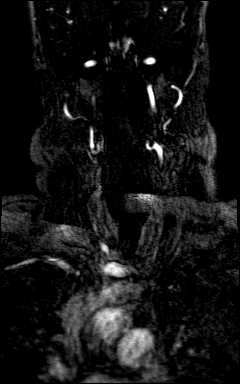
[im 36/79]
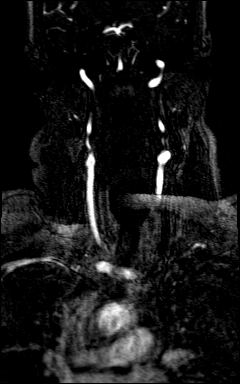
[im 43/79]
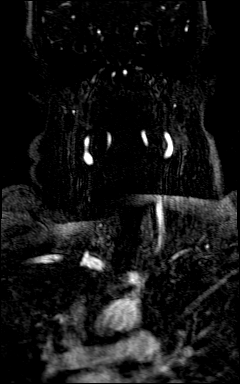
[im 50/79]
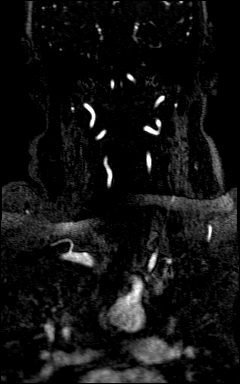
[im 57/79]
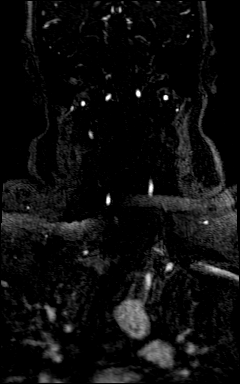
[im 64/79]
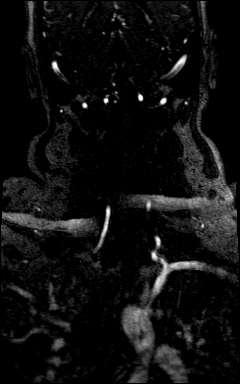
[im 71/79]
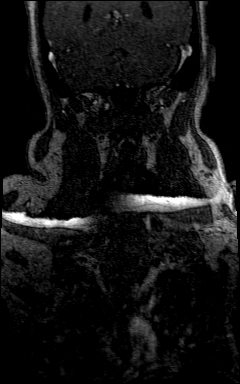
[im 79/79]
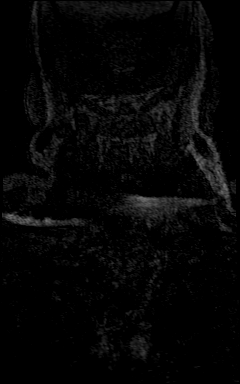

[Series 12: (id)_tt=1.0s_sub · coronal · 0.8mm · 0.78mm/px · 12 of 79 slices shown]
[im 1/79]
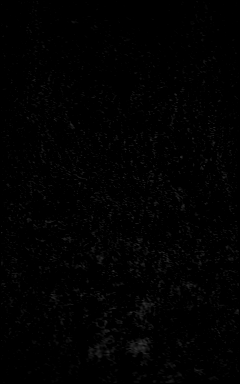
[im 8/79]
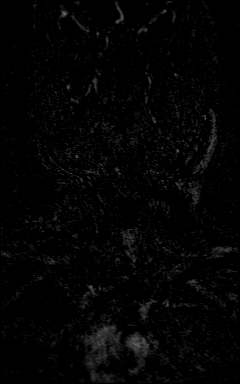
[im 15/79]
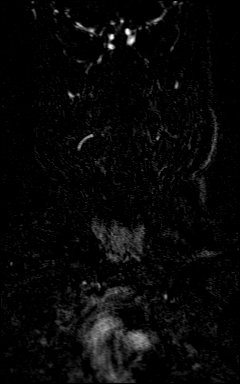
[im 22/79]
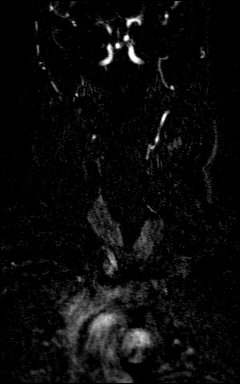
[im 29/79]
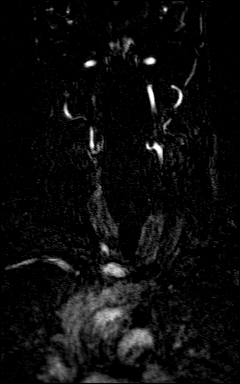
[im 36/79]
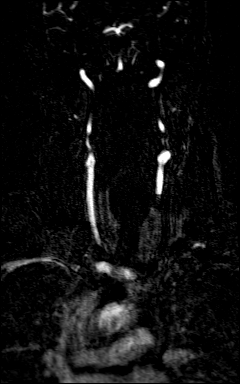
[im 43/79]
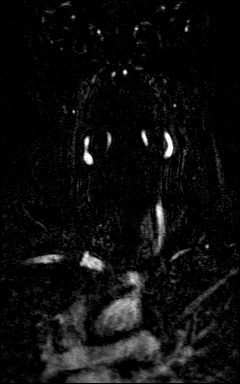
[im 50/79]
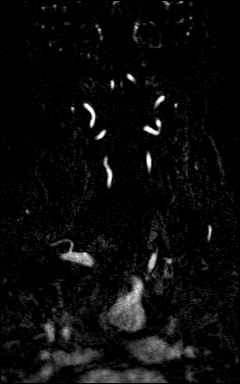
[im 57/79]
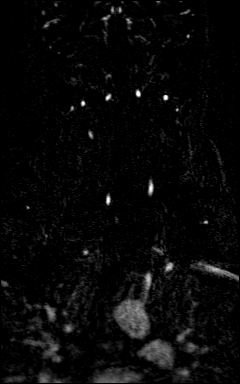
[im 64/79]
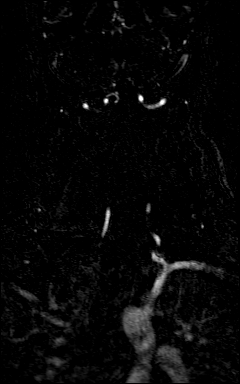
[im 71/79]
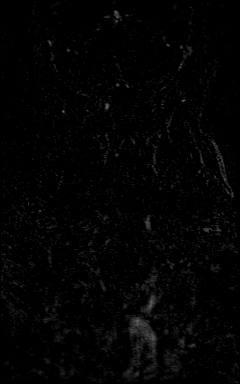
[im 79/79]
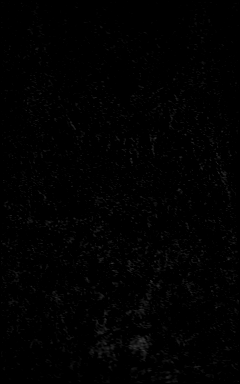

[44 of 48 positions shown; findings below may reference images not displayed]

FINDINGS: MRI HEAD FINDINGS

Brain: Diffusion imaging does not show any acute or subacute
infarction. The brain is normally formed. No evidence old small or
large vessel infarction, mass lesion, hemorrhage, hydrocephalus or
extra-axial collection. No pituitary mass.

Vascular: Major vessels at the base of the brain show flow. No
evidence of arterial or venous pathology in the region of right
temporal bone.

Skull and upper cervical spine: Negative

Sinuses/Orbits: Clear/normal

Other: None

MRA HEAD FINDINGS

Both internal carotid arteries are widely patent through the siphon
regions. The anterior and middle cerebral vessels are normal without
stenosis, aneurysm or vascular malformation. Both vertebral arteries
are widely patent to the basilar artery. No basilar stenosis. No
posterior circulation branch vessel abnormality.

MRA NECK FINDINGS

Branching pattern is normal. Both common carotid arteries widely
patent to the bifurcation. No carotid bifurcation disease. Both
vertebral arteries widely patent through the cervical region. No
cervical internal carotid artery abnormality.
IMPRESSION: 1. Normal MRI of the brain.
2. Normal intracranial MR angiography.
3. Normal MR angiography of the neck vessels.
4. No abnormality seen to explain pulsatile tinnitus on right.

## 2020-11-17 IMAGING — MR MR MRA HEAD W/O CM
1 series · 19 of 48 positions shown · IV contrast (multihance)
Comparison: None

CLINICAL DATA: Pulsatile tinnitus on the right.

EXAM:
MRI HEAD WITHOUT AND WITH CONTRAST
MRA HEAD WITHOUT CONTRAST
MRA NECK WITHOUT AND WITH CONTRAST
TECHNIQUE: Multiplanar, multiecho pulse sequences of the brain and surrounding
structures were obtained without and with intravenous contrast.
Angiographic images of the Circle of Willis were obtained using MRA
technique without intravenous contrast. Angiographic images of the
neck were obtained using MRA technique without and with intravenous
contrast. Carotid stenosis measurements (when applicable) are
obtained utilizing NASCET criteria, using the distal internal
carotid diameter as the denominator.
CONTRAST:  20mL MULTIHANCE GADOBENATE DIMEGLUMINE 529 MG/ML IV SOLN

[Series 4: tof_3d_multi-slab · axial · 0.7mm · 0.35mm/px · z∈[-95,+7]mm · 19 of 154 slices shown]
[im 1/154]
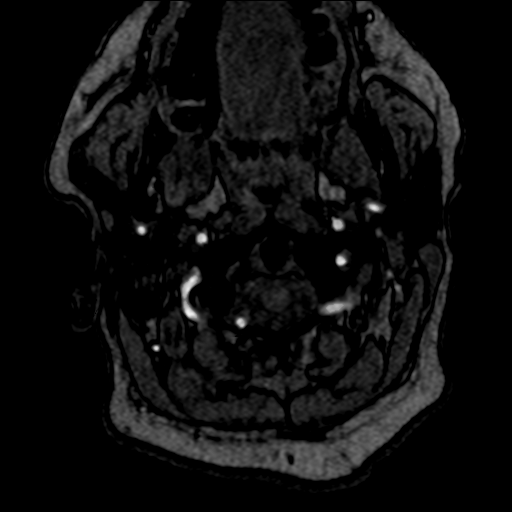
[im 4/154]
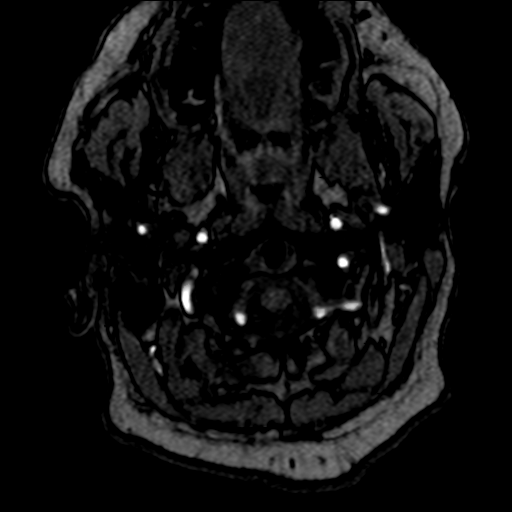
[im 7/154]
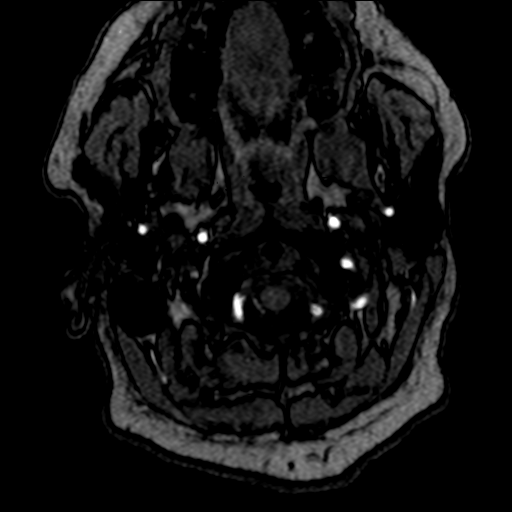
[im 10/154]
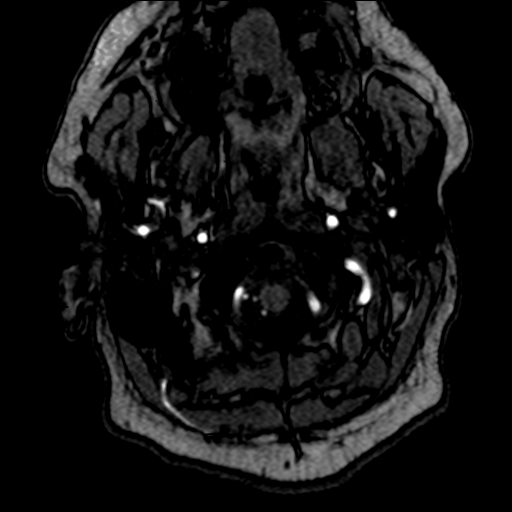
[im 14/154]
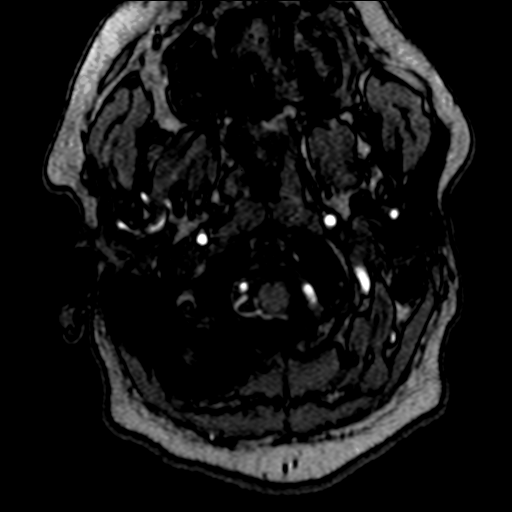
[im 17/154]
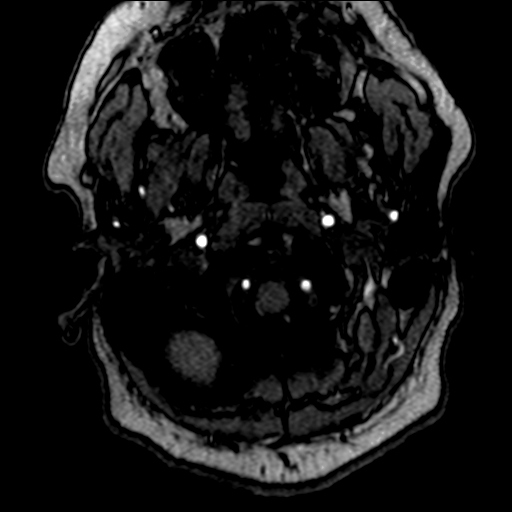
[im 20/154]
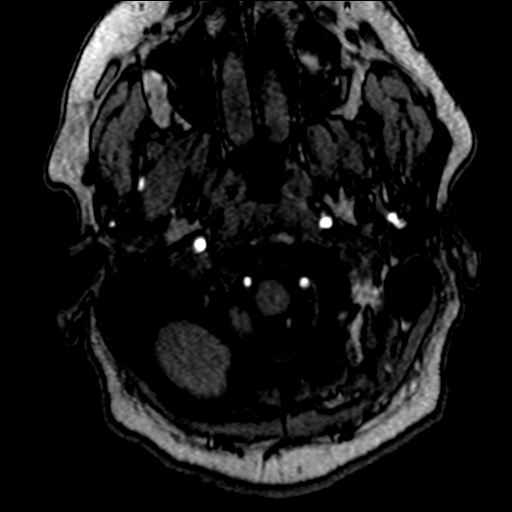
[im 23/154]
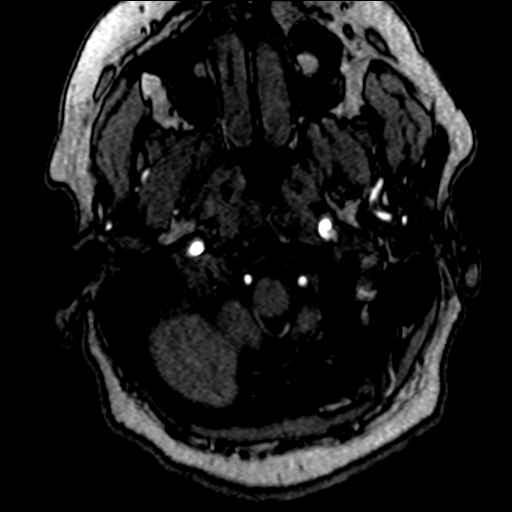
[im 27/154]
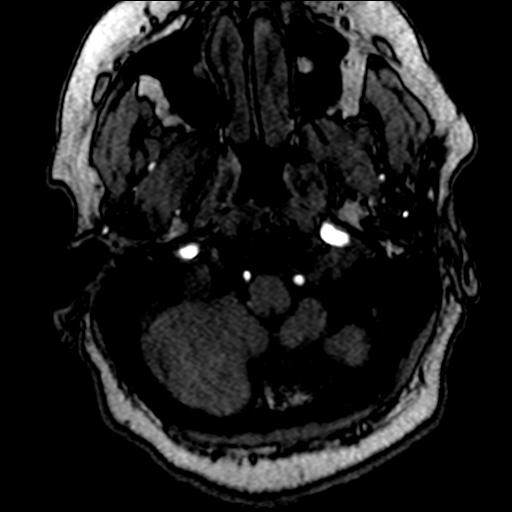
[im 30/154]
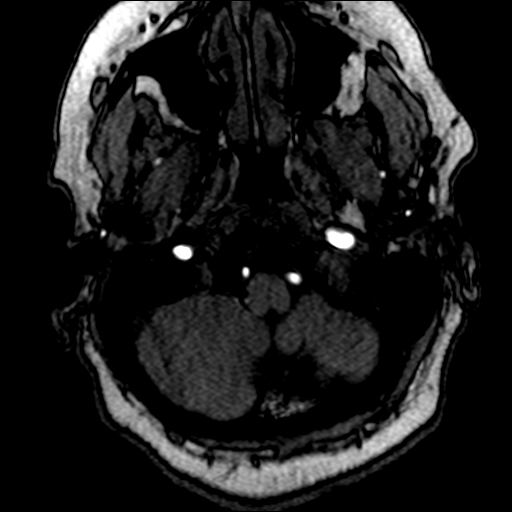
[im 33/154]
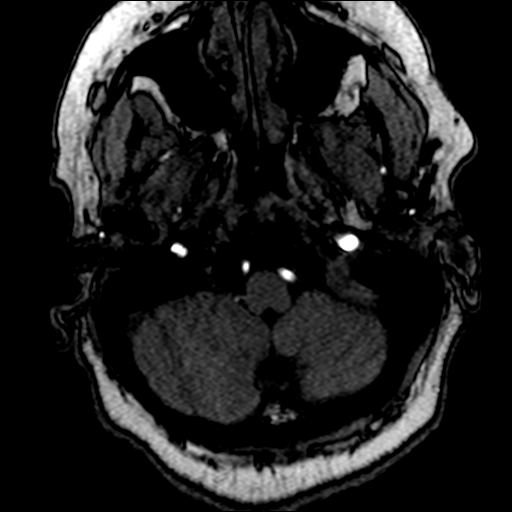
[im 49/154]
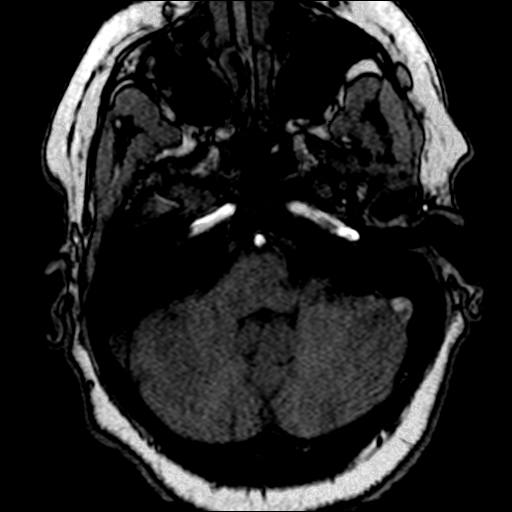
[im 69/154]
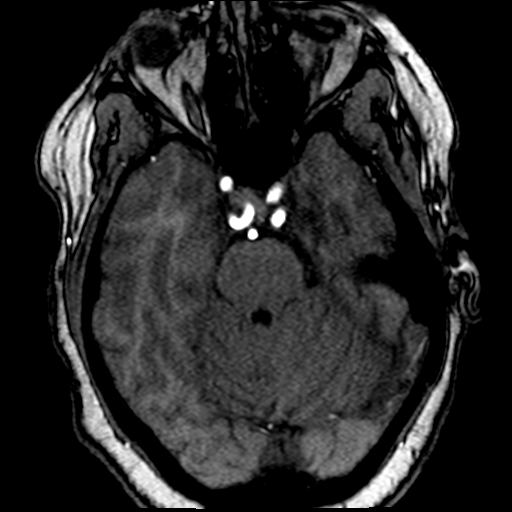
[im 79/154]
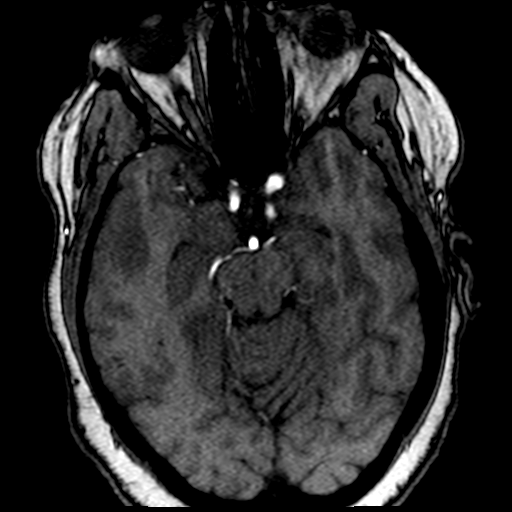
[im 88/154]
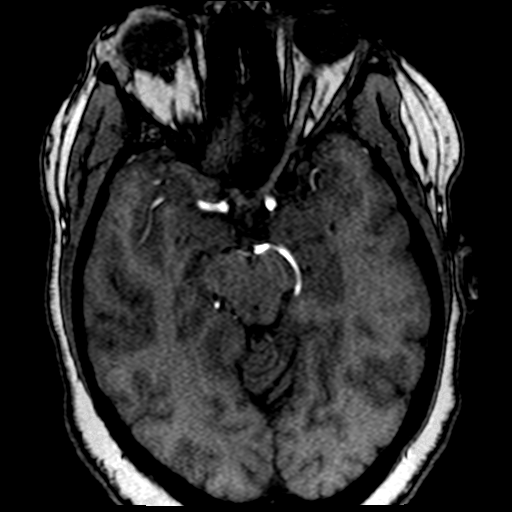
[im 108/154]
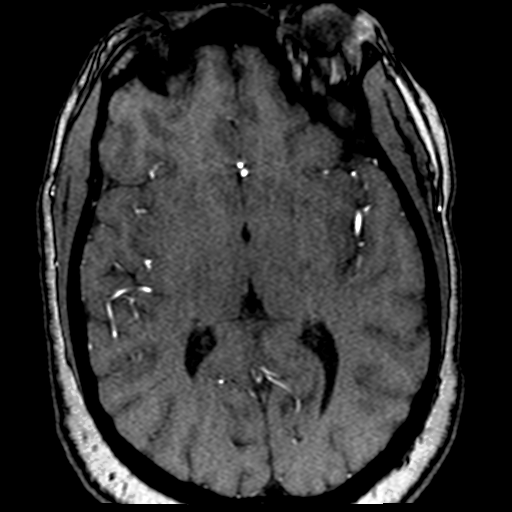
[im 127/154]
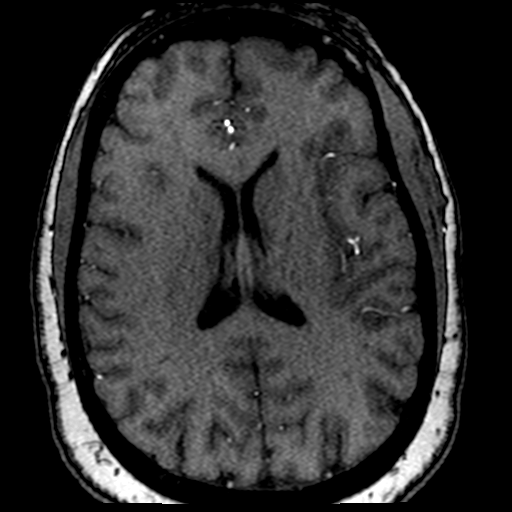
[im 131/154]
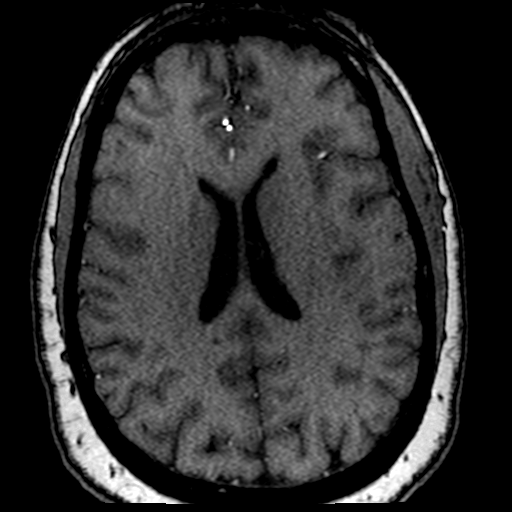
[im 147/154]
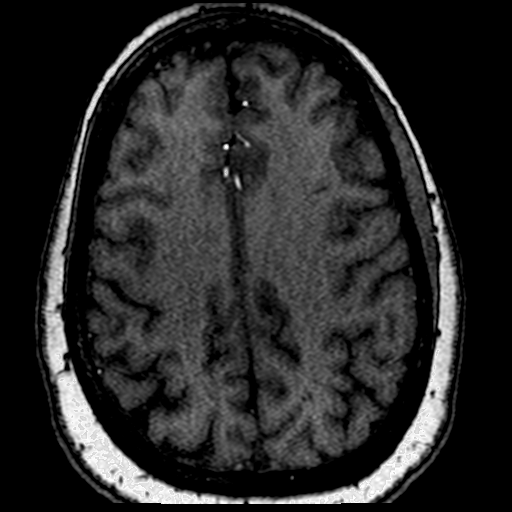

[19 of 48 positions shown; findings below may reference images not displayed]

FINDINGS: MRI HEAD FINDINGS

Brain: Diffusion imaging does not show any acute or subacute
infarction. The brain is normally formed. No evidence old small or
large vessel infarction, mass lesion, hemorrhage, hydrocephalus or
extra-axial collection. No pituitary mass.

Vascular: Major vessels at the base of the brain show flow. No
evidence of arterial or venous pathology in the region of right
temporal bone.

Skull and upper cervical spine: Negative

Sinuses/Orbits: Clear/normal

Other: None

MRA HEAD FINDINGS

Both internal carotid arteries are widely patent through the siphon
regions. The anterior and middle cerebral vessels are normal without
stenosis, aneurysm or vascular malformation. Both vertebral arteries
are widely patent to the basilar artery. No basilar stenosis. No
posterior circulation branch vessel abnormality.

MRA NECK FINDINGS

Branching pattern is normal. Both common carotid arteries widely
patent to the bifurcation. No carotid bifurcation disease. Both
vertebral arteries widely patent through the cervical region. No
cervical internal carotid artery abnormality.
IMPRESSION: 1. Normal MRI of the brain.
2. Normal intracranial MR angiography.
3. Normal MR angiography of the neck vessels.
4. No abnormality seen to explain pulsatile tinnitus on right.

## 2020-11-17 MED ORDER — GADOBENATE DIMEGLUMINE 529 MG/ML IV SOLN
20.0000 mL | Freq: Once | INTRAVENOUS | Status: AC | PRN
  Administered 2020-11-17: 20 mL via INTRAVENOUS

## 2020-11-26 DIAGNOSIS — N939 Abnormal uterine and vaginal bleeding, unspecified: Secondary | ICD-10-CM | POA: Diagnosis not present

## 2020-12-10 ENCOUNTER — Encounter: Payer: Self-pay | Admitting: Gastroenterology

## 2020-12-10 ENCOUNTER — Telehealth: Payer: Self-pay

## 2020-12-10 ENCOUNTER — Other Ambulatory Visit: Payer: Self-pay

## 2020-12-10 ENCOUNTER — Ambulatory Visit (INDEPENDENT_AMBULATORY_CARE_PROVIDER_SITE_OTHER): Payer: BC Managed Care – PPO | Admitting: Gastroenterology

## 2020-12-10 VITALS — BP 126/70 | HR 84 | Ht 65.5 in | Wt 268.2 lb

## 2020-12-10 DIAGNOSIS — Z800 Family history of malignant neoplasm of digestive organs: Secondary | ICD-10-CM

## 2020-12-10 DIAGNOSIS — D500 Iron deficiency anemia secondary to blood loss (chronic): Secondary | ICD-10-CM | POA: Diagnosis not present

## 2020-12-10 DIAGNOSIS — Z7901 Long term (current) use of anticoagulants: Secondary | ICD-10-CM | POA: Diagnosis not present

## 2020-12-10 MED ORDER — PLENVU 140 G PO SOLR: 1.0000 | Freq: Once | ORAL | 0 refills | Status: AC

## 2020-12-10 NOTE — Progress Notes (Signed)
History of Present Illness: This is a 45 year old female referred by Debbrah Alar, NP for the evaluation of iron deficiency anemia and a family history of colon cancer. She has history of a PE in May 2020 and is maintained on Xarelto. She has menorrhagia for several years.  Her periods have not been as heavy recently. She does not donate blood.  She has a history of GERD that is controlled on pantoprazole daily.  No other ongoing gastrointestinal complaints. Denies weight loss, abdominal pain, constipation, diarrhea, change in stool caliber, melena, hematochezia, nausea, vomiting, dysphagia, chest pain.    No Known Allergies Outpatient Medications Prior to Visit  Medication Sig Dispense Refill  . acetaminophen (TYLENOL) 500 MG tablet Take 1,000 mg by mouth every 6 (six) hours as needed for moderate pain.     Marland Kitchen atorvastatin (LIPITOR) 10 MG tablet Take 10 mg by mouth daily.    . Drospirenone (SLYND) 4 MG TABS Take 1 tablet by mouth daily. 28 tablet   . fluticasone (FLONASE) 50 MCG/ACT nasal spray Place 2 sprays into both nostrils daily. (Patient taking differently: Place 2 sprays into both nostrils daily as needed for allergies.) 16 g 6  . furosemide (LASIX) 20 MG tablet Take 2 tablets (40 mg total) by mouth 2 (two) times daily. 3 tablet 0  . lisinopril (ZESTRIL) 20 MG tablet Take 30 mg by mouth daily.    . metoprolol tartrate (LOPRESSOR) 25 MG tablet Take 25 mg by mouth 2 (two) times a day.    . pantoprazole (PROTONIX) 40 MG tablet Take 40 mg by mouth daily.    Marland Kitchen triamcinolone (KENALOG) 0.025 % cream Apply 1 application topically 2 (two) times daily as needed (eczema). 30 g 1  . XARELTO 10 MG TABS tablet TAKE 1 TABLET BY MOUTH EVERY DAY 30 tablet 3  . atorvastatin (LIPITOR) 20 MG tablet Take 20 mg by mouth at bedtime.     No facility-administered medications prior to visit.   Past Medical History:  Diagnosis Date  . Anemia   . Asthma   . Bronchitis   . Cardiac arrhythmia   .  Focal segmental glomerulosclerosis   . GERD (gastroesophageal reflux disease)   . HTN (hypertension)   . Pulmonary emboli [facility])    Past Surgical History:  Procedure Laterality Date  . ECTOPIC PREGNANCY SURGERY    . TUBAL LIGATION     Social History   Socioeconomic History  . Marital status: Married    Spouse name: Not on file  . Number of children: 0  . Years of education: Not on file  . Highest education level: Not on file  Occupational History  . Occupation: Theme park manager: Ages: [employer]  . Occupation: control review  Tobacco Use  . Smoking status: Former Smoker    Types: Cigarettes    Quit date: 12/10/2008    Years since quitting: 12.0  . Smokeless tobacco: Never Used  Vaping Use  . Vaping Use: Never used  Substance and Sexual Activity  . Alcohol use: Yes    Comment: occasionally wine  . Drug use: No  . Sexual activity: Yes    Partners: Male    Birth control/protection: Surgical  Other Topics Concern  . Not on file  Social History Narrative   No children   Step daughter- does not live with patient   Married   1 dog shitzu   Enjoys movies, books, travelling   Completed  bachelors degree   Works for [employer] from home   Social Determinants of Radio broadcast assistant Strain: Not on file  Food Insecurity: Not on file  Transportation Needs: Not on file  Physical Activity: Not on file  Stress: Not on file  Social Connections: Not on file   Family History  Problem Relation Age of Onset  . Lung cancer Mother   . Breast cancer Mother 63  . Diabetes Mother   . Heart disease Father 53  . Hypertension Father   . Diabetes Maternal Grandmother   . Stomach cancer Maternal Grandmother   . Arthritis Maternal Grandfather   . Colon cancer Maternal Aunt        2 aunts  . Colon cancer Maternal Uncle   . Cancer Maternal Aunt        spinal      Review of Systems: Pertinent positive and negative review of systems were  noted in the above HPI section. All other review of systems were otherwise negative.   Physical Exam: General: Well developed, well nourished, no acute distress Head: Normocephalic and atraumatic Eyes: Sclerae anicteric, EOMI Ears: Normal auditory acuity Mouth: Not examined, mask on during Covid-19 pandemic Neck: Supple, no masses or thyromegaly Lungs: Clear throughout to auscultation Heart: Regular rate and rhythm; no murmurs, rubs or bruits Abdomen: Soft, non tender and non distended. No masses, hepatosplenomegaly or hernias noted. Normal Bowel sounds Rectal: Deferred to colonoscopy  Musculoskeletal: Symmetrical with no gross deformities  Skin: No lesions on visible extremities Pulses:  Normal pulses noted Extremities: No clubbing, cyanosis, edema or deformities noted Neurological: Alert oriented x 4, grossly nonfocal Cervical Nodes:  No significant cervical adenopathy Inguinal Nodes: No significant inguinal adenopathy Psychological:  Alert and cooperative. Normal mood and affect   Assessment and Recommendations:  1. Iron deficiency anemia. Family history of colon cancer, maternal uncle and aunt. GERD.  R/O gastrointestinal source of blood loss. Menorrhagia is the likely cause. Mgmt of IDA per Oncology and PCP.  Schedule colonoscopy and EGD. The risks (including bleeding, perforation, infection, missed lesions, medication reactions and possible hospitalization or surgery if complications occur), benefits, and alternatives to colonoscopy with possible biopsy and possible polypectomy were discussed with the patient and they consent to proceed. The risks (including bleeding, perforation, infection, missed lesions, medication reactions and possible hospitalization or surgery if complications occur), benefits, and alternatives to endoscopy with possible biopsy and possible dilation were discussed with the patient and they consent to proceed.   2. History of a PE in May 2020. Hold Xarelto 2  days before procedure - will instruct when and how to resume after procedure. Low but real risk of cardiovascular event such as heart attack, stroke, embolism, thrombosis or ischemia/infarct of other organs off Xarelto explained and need to seek urgent help if this occurs. The patient consents to proceed. Will communicate by phone or EMR with patient's prescribing provider to confirm that holding Xarelto is reasonable in this case.     cc: Debbrah Alar, NP [address] [place],  [place] [id number]

## 2020-12-10 NOTE — Telephone Encounter (Signed)
   Tiffany Hubbard July 30, 1976 [id number]  Dear Dr. Maylon Peppers:  We have scheduled the above named patient for a(n) EGD/Colonoscopy procedure. Our records show that (s)he is on anticoagulation therapy.  Please advise as to whether the patient may come off their therapy of Xarelto 2 days prior to their procedure which is scheduled for 01/07/21.  Please route your response to [employer], CMA.  Sincerely,    [facility]

## 2020-12-10 NOTE — Patient Instructions (Addendum)
You have been scheduled for an endoscopy and colonoscopy. Please follow the written instructions given to you at your visit today. Please pick up your prep supplies at the pharmacy within the next 1-3 days. If you use inhalers (even only as needed), please bring them with you on the day of your procedure.  Thank you for choosing me and [facility].  Malcolm T. Stark, Jr., MD., FACG   

## 2020-12-13 NOTE — Telephone Encounter (Signed)
Called patient with no answer and voice mailbox is full. Will try again later.

## 2020-12-14 NOTE — Telephone Encounter (Signed)
Left message for patient to return my call.

## 2020-12-14 NOTE — Telephone Encounter (Signed)
Pt was informed of message.

## 2020-12-14 NOTE — Telephone Encounter (Signed)
Confirmed with patient that she is aware to hold Xarelto 2 days prior to her procedure. Patient verbalized understanding.

## 2021-01-07 ENCOUNTER — Encounter: Payer: BC Managed Care – PPO | Admitting: Gastroenterology

## 2021-01-30 ENCOUNTER — Inpatient Hospital Stay (HOSPITAL_BASED_OUTPATIENT_CLINIC_OR_DEPARTMENT_OTHER): Payer: BC Managed Care – PPO | Admitting: Hematology & Oncology

## 2021-01-30 ENCOUNTER — Other Ambulatory Visit: Payer: Self-pay

## 2021-01-30 ENCOUNTER — Inpatient Hospital Stay: Payer: BC Managed Care – PPO | Attending: Hematology & Oncology

## 2021-01-30 ENCOUNTER — Encounter: Payer: Self-pay | Admitting: Hematology & Oncology

## 2021-01-30 VITALS — BP 118/58 | HR 74 | Temp 98.8°F | Resp 18 | Wt 266.0 lb

## 2021-01-30 DIAGNOSIS — I2699 Other pulmonary embolism without acute cor pulmonale: Secondary | ICD-10-CM

## 2021-01-30 DIAGNOSIS — N920 Excessive and frequent menstruation with regular cycle: Secondary | ICD-10-CM | POA: Insufficient documentation

## 2021-01-30 DIAGNOSIS — D500 Iron deficiency anemia secondary to blood loss (chronic): Secondary | ICD-10-CM | POA: Diagnosis not present

## 2021-01-30 DIAGNOSIS — Z7901 Long term (current) use of anticoagulants: Secondary | ICD-10-CM | POA: Insufficient documentation

## 2021-01-30 DIAGNOSIS — Z86718 Personal history of other venous thrombosis and embolism: Secondary | ICD-10-CM | POA: Diagnosis not present

## 2021-01-30 DIAGNOSIS — D75839 Thrombocytosis, unspecified: Secondary | ICD-10-CM

## 2021-01-30 LAB — CBC WITH DIFFERENTIAL (CANCER CENTER ONLY)
Abs Immature Granulocytes: 0.04 10*3/uL (ref 0.00–0.07)
Basophils Absolute: 0 10*3/uL (ref 0.0–0.1)
Basophils Relative: 0 %
Eosinophils Absolute: 0.1 10*3/uL (ref 0.0–0.5)
Eosinophils Relative: 1 %
HCT: 38.8 % (ref 36.0–46.0)
Hemoglobin: 12.5 g/dL (ref 12.0–15.0)
Immature Granulocytes: 1 %
Lymphocytes Relative: 29 %
Lymphs Abs: 2.1 10*3/uL (ref 0.7–4.0)
MCH: 27.5 pg (ref 26.0–34.0)
MCHC: 32.2 g/dL (ref 30.0–36.0)
MCV: 85.5 fL (ref 80.0–100.0)
Monocytes Absolute: 0.5 10*3/uL (ref 0.1–1.0)
Monocytes Relative: 7 %
Neutro Abs: 4.4 10*3/uL (ref 1.7–7.7)
Neutrophils Relative %: 62 %
Platelet Count: 484 10*3/uL — ABNORMAL HIGH (ref 150–400)
RBC: 4.54 MIL/uL (ref 3.87–5.11)
RDW: 15.2 % (ref 11.5–15.5)
WBC Count: 7.2 10*3/uL (ref 4.0–10.5)
nRBC: 0 % (ref 0.0–0.2)

## 2021-01-30 LAB — SAVE SMEAR(SSMR), FOR PROVIDER SLIDE REVIEW

## 2021-01-30 LAB — CMP (CANCER CENTER ONLY)
ALT: 32 U/L (ref 0–44)
AST: 22 U/L (ref 15–41)
Albumin: 4.2 g/dL (ref 3.5–5.0)
Alkaline Phosphatase: 74 U/L (ref 38–126)
Anion gap: 8 (ref 5–15)
BUN: 9 mg/dL (ref 6–20)
CO2: 26 mmol/L (ref 22–32)
Calcium: 9.9 mg/dL (ref 8.9–10.3)
Chloride: 104 mmol/L (ref 98–111)
Creatinine: 0.74 mg/dL (ref 0.44–1.00)
GFR, Estimated: >60 mL/min (ref >60)
Glucose, Bld: 95 mg/dL (ref 70–99)
Potassium: 4.2 mmol/L (ref 3.5–5.1)
Sodium: 138 mmol/L (ref 135–145)
Total Bilirubin: 0.3 mg/dL (ref 0.3–1.2)
Total Protein: 7.6 g/dL (ref 6.5–8.1)

## 2021-01-30 NOTE — Progress Notes (Signed)
Hematology and Oncology Follow Up Visit  Tiffany Hubbard [id number] Jun 15, 1976 45 y.o. 01/30/2021   Principle Diagnosis:  Acute unprovoked PTE of LUL, with possible smaller subsegmental PTE involving LLL; no DVT in the lower extremities - diagnosed 02/2019, hyper coag work up negative Underlying FSGS Iron deficiency anemia secondary to menorrhagia   Current Therapy:   Maintenance Xarelto 10 mg PO daily -d/c on 01/30/2021 IV iron as indicated    Interim History:  Tiffany Hubbard is here today for follow-up.  This is the first time that have seen her.  She has been followed in the past by Dr. Maylon Peppers and Judson Roch.  She had the DVT 2 years ago.  This was felt to be "unprovoked.".  However, at the time, she seemed to have developed acute renal insufficiency.  Sounds like she developed glomerulosclerosis.  Does appear to be a focal process.  She had quite a bit of protein in her urine.  This was treated by nephrology.  She says her renal function has improved nicely.  She feels that it may have been the renal failure that may have helped precipitate the blood clot.  She feels nice.  The problems that she is having quite a lot of monthly cycles now.  She said this is pretty much continuous.  I really think we can stop the Xarelto.  She been on it for 2 years.  She was on full dose for I think 6 months and then maintenance.  She has never had a blood clot before.   Her last CT angiogram done on 08/15/2020 did not show any residual pulmonary emboli.  Her last Doppler of her leg done in May 2020 did not show any blood clots bilaterally in her lower legs.  Again, I think we can hold the Xarelto for right now.  I think this will certainly help with respect to her monthly cycles.  Her appetite is good.  She is not a vegetarian.  She works from home.  She is trying to stay active.  There is no change in bowel or bladder habits.  She is up-to-date with her mammograms.  Overall, her performance  status is ECOG 0.   Medications:  Allergies as of 01/30/2021   No Known Allergies     Medication List       Accurate as of January 30, 2021  2:40 PM. If you have any questions, ask your nurse or doctor.        acetaminophen 500 MG tablet Commonly known as: TYLENOL Take 1,000 mg by mouth every 6 (six) hours as needed for moderate pain.   atorvastatin 10 MG tablet Commonly known as: LIPITOR Take 10 mg by mouth daily.   docusate sodium 100 MG capsule Commonly known as: COLACE Take 100 mg by mouth 2 (two) times daily.   fluticasone 50 MCG/ACT nasal spray Commonly known as: FLONASE Place 2 sprays into both nostrils daily. What changed:   when to take this  reasons to take this   furosemide 20 MG tablet Commonly known as: LASIX Take 2 tablets (40 mg total) by mouth 2 (two) times daily.   lisinopril 20 MG tablet Commonly known as: ZESTRIL Take 30 mg by mouth daily.   metoprolol tartrate 25 MG tablet Commonly known as: LOPRESSOR Take 25 mg by mouth 2 (two) times a day.   pantoprazole 40 MG tablet Commonly known as: PROTONIX Take 40 mg by mouth daily.   Plenvu 140 g Solr Generic drug: PEG-KCl-NaCl-NaSulf-Na Asc-C See  admin instructions.   Slynd 4 MG Tabs Generic drug: Drospirenone Take 1 tablet by mouth daily.   triamcinolone 0.025 % cream Commonly known as: KENALOG Apply 1 application topically 2 (two) times daily as needed (eczema).   Xarelto 10 MG Tabs tablet Generic drug: rivaroxaban TAKE 1 TABLET BY MOUTH EVERY DAY       Allergies: No Known Allergies  Past Medical History, Surgical history, Social history, and Family History were reviewed and updated.  Review of Systems: Review of Systems  Constitutional: Negative.   HENT: Negative.   Eyes: Negative.   Respiratory: Negative.   Cardiovascular: Negative.   Gastrointestinal: Negative.   Genitourinary: Negative.   Musculoskeletal: Negative.   Skin: Negative.   Neurological: Negative.    Endo/Heme/Allergies: Negative.   Psychiatric/Behavioral: Negative.      Physical Exam:  weight is 266 lb (120.7 kg). Her temperature is 98.8 F (37.1 C). Her blood pressure is 118/58 (abnormal) and her pulse is 74. Her respiration is 18 and oxygen saturation is 100%.   Wt Readings from Last 3 Encounters:  01/30/21 266 lb (120.7 kg)  12/10/20 268 lb 4 oz (121.7 kg)  10/29/20 265 lb 6.4 oz (120.4 kg)    Physical Exam Vitals reviewed.  HENT:     Head: Normocephalic and atraumatic.  Eyes:     Pupils: Pupils are equal, round, and reactive to light.  Cardiovascular:     Rate and Rhythm: Normal rate and regular rhythm.     Heart sounds: Normal heart sounds.  Pulmonary:     Effort: Pulmonary effort is normal.     Breath sounds: Normal breath sounds.  Abdominal:     General: Bowel sounds are normal.     Palpations: Abdomen is soft.  Musculoskeletal:        General: No tenderness or deformity. Normal range of motion.     Cervical back: Normal range of motion.  Lymphadenopathy:     Cervical: No cervical adenopathy.  Skin:    General: Skin is warm and dry.     Findings: No erythema or rash.  Neurological:     Mental Status: She is alert and oriented to person, place, and time.  Psychiatric:        Behavior: Behavior normal.        Thought Content: Thought content normal.        Judgment: Judgment normal.    Lab Results  Component Value Date   WBC 7.2 01/30/2021   HGB 12.5 01/30/2021   HCT 38.8 01/30/2021   MCV 85.5 01/30/2021   PLT 484 (H) 01/30/2021   Lab Results  Component Value Date   FERRITIN 27 01/31/2020   IRON 48 01/31/2020   TIBC 320 01/31/2020   UIBC 272 01/31/2020   IRONPCTSAT 15 (L) 01/31/2020   Lab Results  Component Value Date   RBC 4.54 01/30/2021   Lab Results  Component Value Date   KPAFRELGTCHN 22.8 (H) 08/26/2019   LAMBDASER 13.5 08/26/2019   KAPLAMBRATIO 1.69 (H) 08/26/2019   Lab Results  Component Value Date   IGGSERUM 882 08/26/2019    IGA 167 08/26/2019   IGMSERUM 131 08/26/2019   Lab Results  Component Value Date   TOTALPROTELP 6.4 08/26/2019     Chemistry      Component Value Date/Time   NA 138 01/30/2021 1340   K 4.2 01/30/2021 1340   CL 104 01/30/2021 1340   CO2 26 01/30/2021 1340   BUN 9 01/30/2021 1340   CREATININE  0.74 01/30/2021 1340   CREATININE 0.84 07/20/2020 1557      Component Value Date/Time   CALCIUM 9.9 01/30/2021 1340   ALKPHOS 74 01/30/2021 1340   AST 22 01/30/2021 1340   ALT 32 01/30/2021 1340   BILITOT 0.3 01/30/2021 1340       Impression and Plan: Ms. Hink is a very pleasant 45 yo African American female with history of pulmonary embolism of LUL, with possible smaller subsegmental pulmonary embolism involving the LLL; no DVT in the lower extremities - diagnosed 02/2019.   She did have underlying FSGS (diagnosed 03/2019).  Again, this may have been a factor with respect to the development of the thromboembolic disease.  Her hyper coag work up was negative.   I really think that we can stop the Xarelto.  Again, she is having problems with her monthly cycles.  She has been on Xarelto for 2 years.  I really think that she will do okay.  I would like to see her back in 6-7 weeks.  She has a strong faith.  I gave her a prayer blanket.  She is very thankful for this.  Told her that she has to make sure she stays well-hydrated.  I truly believe that she having developed the glomerulosclerosis certainly would be a factor with respect to her developing the thromboembolic disease.   Volanda Napoleon, MD 4/27/20222:40 PM

## 2021-01-31 LAB — IRON AND TIBC
Iron: 39 ug/dL — ABNORMAL LOW (ref 41–142)
Saturation Ratios: 10 % — ABNORMAL LOW (ref 21–57)
TIBC: 383 ug/dL (ref 236–444)
UIBC: 344 ug/dL (ref 120–384)

## 2021-01-31 LAB — FERRITIN: Ferritin: 16 ng/mL (ref 11–307)

## 2021-02-04 ENCOUNTER — Telehealth: Payer: Self-pay

## 2021-02-04 ENCOUNTER — Other Ambulatory Visit: Payer: Self-pay | Admitting: Family

## 2021-02-04 NOTE — Telephone Encounter (Signed)
Called and left a vm to call and sch two doses of iv iron per 02/04/21 sch message  Yuuki Skeens

## 2021-02-05 ENCOUNTER — Telehealth: Payer: Self-pay

## 2021-02-05 NOTE — Telephone Encounter (Signed)
Pts 6/15 appt has been r/s to 6/16 as dr Marin Olp is out of the office on 6/15, pt to gain updated sch at her 02/06/21 appt   Tiffany Hubbard

## 2021-02-06 ENCOUNTER — Other Ambulatory Visit: Payer: Self-pay

## 2021-02-06 ENCOUNTER — Inpatient Hospital Stay: Payer: BC Managed Care – PPO | Attending: Hematology & Oncology

## 2021-02-06 VITALS — BP 125/68 | HR 68 | Temp 98.5°F | Resp 18

## 2021-02-06 DIAGNOSIS — D500 Iron deficiency anemia secondary to blood loss (chronic): Secondary | ICD-10-CM | POA: Insufficient documentation

## 2021-02-06 MED ORDER — SODIUM CHLORIDE 0.9 % IV SOLN
125.0000 mg | Freq: Once | INTRAVENOUS | Status: AC
  Administered 2021-02-06: 125 mg via INTRAVENOUS
  Filled 2021-02-06: qty 10

## 2021-02-06 MED ORDER — SODIUM CHLORIDE 0.9 % IV SOLN
Freq: Once | INTRAVENOUS | Status: AC
  Filled 2021-02-06: qty 250

## 2021-02-06 NOTE — Patient Instructions (Signed)
[facility]  Discharge Instructions: Thank you for choosing [facility] to provide your oncology and hematology care.   If you have a lab appointment with the [facility], please go directly to the [facility] and check in at the registration area.  Wear comfortable clothing and clothing appropriate for easy access to any Portacath or PICC line.   We strive to give you quality time with your provider. You may need to reschedule your appointment if you arrive late (15 or more minutes).  Arriving late affects you and other patients whose appointments are after yours.  Also, if you miss three or more appointments without notifying the office, you may be dismissed from the clinic at the provider's discretion.      For prescription refill requests, have your pharmacy contact our office and allow 72 hours for refills to be completed.    Today you received the following chemotherapy and/or immunotherapy agents Nulecit.    To help prevent nausea and vomiting after your treatment, we encourage you to take your nausea medication as directed.  BELOW ARE SYMPTOMS THAT SHOULD BE REPORTED IMMEDIATELY: . *FEVER GREATER THAN 100.4 F (38 C) OR HIGHER . *CHILLS OR SWEATING . *NAUSEA AND VOMITING THAT IS NOT CONTROLLED WITH YOUR NAUSEA MEDICATION . *UNUSUAL SHORTNESS OF BREATH . *UNUSUAL BRUISING OR BLEEDING . *URINARY PROBLEMS (pain or burning when urinating, or frequent urination) . *BOWEL PROBLEMS (unusual diarrhea, constipation, pain near the anus) . TENDERNESS IN MOUTH AND THROAT WITH OR WITHOUT PRESENCE OF ULCERS (sore throat, sores in mouth, or a toothache) . UNUSUAL RASH, SWELLING OR PAIN  . UNUSUAL VAGINAL DISCHARGE OR ITCHING   Items with * indicate a potential emergency and should be followed up as soon as possible or go to the Emergency Department if any problems should occur.  Please show the CHEMOTHERAPY ALERT CARD or IMMUNOTHERAPY ALERT CARD at  check-in to the Emergency Department and triage nurse. Should you have questions after your visit or need to cancel or reschedule your appointment, please contact [facility]  [phone] and follow the prompts.  Office hours are 8:00 a.m. to 4:30 p.m. Monday - Friday. Please note that voicemails left after 4:00 p.m. may not be returned until the following business day.  We are closed weekends and major holidays. You have access to a nurse at all times for urgent questions. Please call the main number to the clinic [phone] and follow the prompts.  For any non-urgent questions, you may also contact your provider using MyChart. We now offer e-Visits for anyone 72 and older to request care online for non-urgent symptoms. For details visit [email or web address].   Also download the MyChart app! Go to the app store, search "MyChart", open the app, select [facility], and log in with your MyChart username and password.  Due to Covid, a mask is required upon entering the hospital/clinic. If you do not have a mask, one will be given to you upon arrival. For doctor visits, patients may have 1 support person aged 79 or older with them. For treatment visits, patients cannot have anyone with them due to current Covid guidelines and our immunocompromised population.

## 2021-02-13 ENCOUNTER — Other Ambulatory Visit: Payer: Self-pay

## 2021-02-13 ENCOUNTER — Inpatient Hospital Stay: Payer: BC Managed Care – PPO

## 2021-02-13 VITALS — BP 110/57 | HR 71 | Temp 98.5°F | Resp 17

## 2021-02-13 DIAGNOSIS — D500 Iron deficiency anemia secondary to blood loss (chronic): Secondary | ICD-10-CM | POA: Diagnosis not present

## 2021-02-13 MED ORDER — SODIUM CHLORIDE 0.9 % IV SOLN
125.0000 mg | Freq: Once | INTRAVENOUS | Status: AC
  Administered 2021-02-13: 125 mg via INTRAVENOUS
  Filled 2021-02-13: qty 10

## 2021-02-13 MED ORDER — SODIUM CHLORIDE 0.9 % IV SOLN
Freq: Once | INTRAVENOUS | Status: AC
Start: 2021-02-13 — End: 2021-02-13
  Filled 2021-02-13: qty 250

## 2021-02-13 NOTE — Patient Instructions (Addendum)
[facility]  Discharge Instructions: Thank you for choosing [facility] to provide your oncology and hematology care.   If you have a lab appointment with the Cancer Center, please go directly to the Cancer Center and check in at the registration area.  Wear comfortable clothing and clothing appropriate for easy access to any Portacath or PICC line.   We strive to give you quality time with your provider. You may need to reschedule your appointment if you arrive late (15 or more minutes).  Arriving late affects you and other patients whose appointments are after yours.  Also, if you miss three or more appointments without notifying the office, you may be dismissed from the clinic at the provider's discretion.      For prescription refill requests, have your pharmacy contact our office and allow 72 hours for refills to be completed.    Today you received the following chemotherapy and/or immunotherapy agents Nulecit.    To help prevent nausea and vomiting after your treatment, we encourage you to take your nausea medication as directed.  BELOW ARE SYMPTOMS THAT SHOULD BE REPORTED IMMEDIATELY: . *FEVER GREATER THAN 100.4 F (38 C) OR HIGHER . *CHILLS OR SWEATING . *NAUSEA AND VOMITING THAT IS NOT CONTROLLED WITH YOUR NAUSEA MEDICATION . *UNUSUAL SHORTNESS OF BREATH . *UNUSUAL BRUISING OR BLEEDING . *URINARY PROBLEMS (pain or burning when urinating, or frequent urination) . *BOWEL PROBLEMS (unusual diarrhea, constipation, pain near the anus) . TENDERNESS IN MOUTH AND THROAT WITH OR WITHOUT PRESENCE OF ULCERS (sore throat, sores in mouth, or a toothache) . UNUSUAL RASH, SWELLING OR PAIN  . UNUSUAL VAGINAL DISCHARGE OR ITCHING   Items with * indicate a potential emergency and should be followed up as soon as possible or go to the Emergency Department if any problems should occur.  Please show the CHEMOTHERAPY ALERT CARD or IMMUNOTHERAPY ALERT CARD at  check-in to the Emergency Department and triage nurse. Should you have questions after your visit or need to cancel or reschedule your appointment, please contact [facility]  [phone] and follow the prompts.  Office hours are 8:00 a.m. to 4:30 p.m. Monday - Friday. Please note that voicemails left after 4:00 p.m. may not be returned until the following business day.  We are closed weekends and major holidays. You have access to a nurse at all times for urgent questions. Please call the main number to the clinic [phone] and follow the prompts.  For any non-urgent questions, you may also contact your provider using MyChart. We now offer e-Visits for anyone 18 and older to request care online for non-urgent symptoms. For details visit [email or web address].   Also download the MyChart app! Go to the app store, search "MyChart", open the app, select , and log in with your MyChart username and password.  Due to Covid, a mask is required upon entering the hospital/clinic. If you do not have a mask, one will be given to you upon arrival. For doctor visits, patients may have 1 support person aged 18 or older with them. For treatment visits, patients cannot have anyone with them due to current Covid guidelines and our immunocompromised population.  

## 2021-02-20 DIAGNOSIS — Z01419 Encounter for gynecological examination (general) (routine) without abnormal findings: Secondary | ICD-10-CM | POA: Diagnosis not present

## 2021-02-20 DIAGNOSIS — Z6841 Body Mass Index (BMI) 40.0 and over, adult: Secondary | ICD-10-CM | POA: Diagnosis not present

## 2021-02-20 DIAGNOSIS — Z1231 Encounter for screening mammogram for malignant neoplasm of breast: Secondary | ICD-10-CM | POA: Diagnosis not present

## 2021-02-21 ENCOUNTER — Other Ambulatory Visit: Payer: Self-pay | Admitting: Hematology & Oncology

## 2021-02-21 DIAGNOSIS — I2699 Other pulmonary embolism without acute cor pulmonale: Secondary | ICD-10-CM

## 2021-03-11 ENCOUNTER — Telehealth: Payer: Self-pay | Admitting: *Deleted

## 2021-03-11 NOTE — Telephone Encounter (Signed)
Patient did not answer the phone for her 430 phone PV. I left a message for her to call be back today. Patient was called again about 10 minutes later-still no answer it went to voice mail. I will try her again tomorrow.

## 2021-03-12 NOTE — Telephone Encounter (Signed)
Patient no show PV appointment for today. I called patient, no answer, left a message for the patient to call us back today before 5 pm or the PV and procedure will be cancelled

## 2021-03-12 NOTE — Telephone Encounter (Addendum)
Called patient for pv. No answer, mail box is full unable to leave a message per recording. Colonoscopy/EGD and PV canceled and no show letter mailed to the patient.

## 2021-03-20 ENCOUNTER — Other Ambulatory Visit: Payer: BC Managed Care – PPO

## 2021-03-20 ENCOUNTER — Ambulatory Visit: Payer: BC Managed Care – PPO | Admitting: Hematology & Oncology

## 2021-03-21 ENCOUNTER — Encounter: Payer: Self-pay | Admitting: Hematology & Oncology

## 2021-03-21 ENCOUNTER — Other Ambulatory Visit: Payer: Self-pay

## 2021-03-21 ENCOUNTER — Inpatient Hospital Stay: Payer: BC Managed Care – PPO | Attending: Hematology & Oncology

## 2021-03-21 ENCOUNTER — Inpatient Hospital Stay (HOSPITAL_BASED_OUTPATIENT_CLINIC_OR_DEPARTMENT_OTHER): Payer: BC Managed Care – PPO | Admitting: Hematology & Oncology

## 2021-03-21 ENCOUNTER — Telehealth: Payer: Self-pay

## 2021-03-21 VITALS — BP 123/78 | HR 84 | Temp 98.1°F | Resp 17 | Wt 264.0 lb

## 2021-03-21 DIAGNOSIS — N920 Excessive and frequent menstruation with regular cycle: Secondary | ICD-10-CM | POA: Diagnosis not present

## 2021-03-21 DIAGNOSIS — I2699 Other pulmonary embolism without acute cor pulmonale: Secondary | ICD-10-CM

## 2021-03-21 DIAGNOSIS — Z79899 Other long term (current) drug therapy: Secondary | ICD-10-CM | POA: Diagnosis not present

## 2021-03-21 DIAGNOSIS — D500 Iron deficiency anemia secondary to blood loss (chronic): Secondary | ICD-10-CM

## 2021-03-21 LAB — CBC WITH DIFFERENTIAL (CANCER CENTER ONLY)
Abs Immature Granulocytes: 0.01 10*3/uL (ref 0.00–0.07)
Basophils Absolute: 0 10*3/uL (ref 0.0–0.1)
Basophils Relative: 1 %
Eosinophils Absolute: 0.1 10*3/uL (ref 0.0–0.5)
Eosinophils Relative: 2 %
HCT: 41.1 % (ref 36.0–46.0)
Hemoglobin: 13.5 g/dL (ref 12.0–15.0)
Immature Granulocytes: 0 %
Lymphocytes Relative: 36 %
Lymphs Abs: 1.8 10*3/uL (ref 0.7–4.0)
MCH: 28.3 pg (ref 26.0–34.0)
MCHC: 32.8 g/dL (ref 30.0–36.0)
MCV: 86.2 fL (ref 80.0–100.0)
Monocytes Absolute: 0.3 10*3/uL (ref 0.1–1.0)
Monocytes Relative: 7 %
Neutro Abs: 2.6 10*3/uL (ref 1.7–7.7)
Neutrophils Relative %: 54 %
Platelet Count: 421 10*3/uL — ABNORMAL HIGH (ref 150–400)
RBC: 4.77 MIL/uL (ref 3.87–5.11)
RDW: 15 % (ref 11.5–15.5)
WBC Count: 4.8 10*3/uL (ref 4.0–10.5)
nRBC: 0 % (ref 0.0–0.2)

## 2021-03-21 LAB — CMP (CANCER CENTER ONLY)
ALT: 49 U/L — ABNORMAL HIGH (ref 0–44)
AST: 25 U/L (ref 15–41)
Albumin: 4.4 g/dL (ref 3.5–5.0)
Alkaline Phosphatase: 78 U/L (ref 38–126)
Anion gap: 8 (ref 5–15)
BUN: 9 mg/dL (ref 6–20)
CO2: 26 mmol/L (ref 22–32)
Calcium: 10.2 mg/dL (ref 8.9–10.3)
Chloride: 103 mmol/L (ref 98–111)
Creatinine: 0.75 mg/dL (ref 0.44–1.00)
GFR, Estimated: >60 mL/min (ref >60)
Glucose, Bld: 80 mg/dL (ref 70–99)
Potassium: 4.3 mmol/L (ref 3.5–5.1)
Sodium: 137 mmol/L (ref 135–145)
Total Bilirubin: 0.3 mg/dL (ref 0.3–1.2)
Total Protein: 7.4 g/dL (ref 6.5–8.1)

## 2021-03-21 LAB — D-DIMER, QUANTITATIVE: D-Dimer, Quant: 0.28 ug/mL-FEU (ref 0.00–0.50)

## 2021-03-21 MED ORDER — RIVAROXABAN 2.5 MG PO TABS: 5.0000 mg | ORAL_TABLET | Freq: Every day | ORAL | 0 refills | Status: DC

## 2021-03-21 NOTE — Progress Notes (Signed)
Hematology and Oncology Follow Up Visit  Tiffany Hubbard [id number] 1976/01/25 45 y.o. 03/21/2021   Principle Diagnosis:  Acute unprovoked PTE of LUL, with possible smaller subsegmental PTE involving LLL; no DVT in the lower extremities - diagnosed 02/2019, hyper coag work up negative Underlying FSGS Iron deficiency anemia secondary to menorrhagia   Current Therapy:   Maintenance Xarelto 10 mg PO daily -d/c on 01/30/2021 IV iron as indicated    Interim History:  Tiffany Hubbard is here today for follow-up.  She is now off Xarelto.  She is doing well.  She says her monthly cycles are doing a lot better.  She is planning on going down to [place] in July.  I think that we probably need to have her on some low-dose Xarelto while she is traveling.  I just think this would be a good idea.  I will going put her on 5 mg a day while she is down there.  She has had no problems with chest wall pain.  She has had no cough.  She has had no nausea or vomiting.  She has had no issues with bowels or bladder.  She has had no fever.  She has had no leg swelling.  She had this past history of focal segmental glomerular sclerosis.  I really do not think this is much of a problem right now.  Showed normal renal function.  She has had no problems with rashes.  Overall, performance status is ECOG 1.    Medications:  Allergies as of 03/21/2021   No Known Allergies      Medication List        Accurate as of March 21, 2021  3:45 PM. If you have any questions, ask your nurse or doctor.          acetaminophen 500 MG tablet Commonly known as: TYLENOL Take 1,000 mg by mouth every 6 (six) hours as needed for moderate pain.   atorvastatin 10 MG tablet Commonly known as: LIPITOR Take 10 mg by mouth daily.   docusate sodium 100 MG capsule Commonly known as: COLACE Take 100 mg by mouth 2 (two) times daily.   fluticasone 50 MCG/ACT nasal spray Commonly known as: FLONASE Place 2 sprays into both  nostrils daily. What changed:  when to take this reasons to take this   furosemide 20 MG tablet Commonly known as: LASIX Take 2 tablets (40 mg total) by mouth 2 (two) times daily.   lisinopril 20 MG tablet Commonly known as: ZESTRIL Take 30 mg by mouth daily.   metoprolol tartrate 25 MG tablet Commonly known as: LOPRESSOR Take 25 mg by mouth 2 (two) times a day.   pantoprazole 40 MG tablet Commonly known as: PROTONIX Take 40 mg by mouth daily.   Plenvu 140 g Solr Generic drug: PEG-KCl-NaCl-NaSulf-Na Asc-C See admin instructions.   Slynd 4 MG Tabs Generic drug: Drospirenone Take 1 tablet by mouth daily.   triamcinolone 0.025 % cream Commonly known as: KENALOG Apply 1 application topically 2 (two) times daily as needed (eczema).   Xarelto 10 MG Tabs tablet Generic drug: rivaroxaban TAKE 1 TABLET BY MOUTH EVERY DAY        Allergies: No Known Allergies  Past Medical History, Surgical history, Social history, and Family History were reviewed and updated.  Review of Systems: Review of Systems  Constitutional: Negative.   HENT: Negative.    Eyes: Negative.   Respiratory: Negative.    Cardiovascular: Negative.   Gastrointestinal: Negative.  Genitourinary: Negative.   Musculoskeletal: Negative.   Skin: Negative.   Neurological: Negative.   Endo/Heme/Allergies: Negative.   Psychiatric/Behavioral: Negative.      Physical Exam:  weight is 264 lb (119.7 kg). Her oral temperature is 98.1 F (36.7 C). Her blood pressure is 123/78 and her pulse is 84. Her respiration is 17 and oxygen saturation is 100%.   Wt Readings from Last 3 Encounters:  03/21/21 264 lb (119.7 kg)  01/30/21 266 lb (120.7 kg)  12/10/20 268 lb 4 oz (121.7 kg)    Physical Exam Vitals reviewed.  HENT:     Head: Normocephalic and atraumatic.  Eyes:     Pupils: Pupils are equal, round, and reactive to light.  Cardiovascular:     Rate and Rhythm: Normal rate and regular rhythm.     Heart  sounds: Normal heart sounds.  Pulmonary:     Effort: Pulmonary effort is normal.     Breath sounds: Normal breath sounds.  Abdominal:     General: Bowel sounds are normal.     Palpations: Abdomen is soft.  Musculoskeletal:        General: No tenderness or deformity. Normal range of motion.     Cervical back: Normal range of motion.  Lymphadenopathy:     Cervical: No cervical adenopathy.  Skin:    General: Skin is warm and dry.     Findings: No erythema or rash.  Neurological:     Mental Status: She is alert and oriented to person, place, and time.  Psychiatric:        Behavior: Behavior normal.        Thought Content: Thought content normal.        Judgment: Judgment normal.   Lab Results  Component Value Date   WBC 4.8 03/21/2021   HGB 13.5 03/21/2021   HCT 41.1 03/21/2021   MCV 86.2 03/21/2021   PLT 421 (H) 03/21/2021   Lab Results  Component Value Date   FERRITIN 16 01/30/2021   IRON 39 (L) 01/30/2021   TIBC 383 01/30/2021   UIBC 344 01/30/2021   IRONPCTSAT 10 (L) 01/30/2021   Lab Results  Component Value Date   RBC 4.77 03/21/2021   Lab Results  Component Value Date   KPAFRELGTCHN 22.8 (H) 08/26/2019   LAMBDASER 13.5 08/26/2019   KAPLAMBRATIO 1.69 (H) 08/26/2019   Lab Results  Component Value Date   IGGSERUM 882 08/26/2019   IGA 167 08/26/2019   IGMSERUM 131 08/26/2019   Lab Results  Component Value Date   TOTALPROTELP 6.4 08/26/2019     Chemistry      Component Value Date/Time   NA 137 03/21/2021 1507   K 4.3 03/21/2021 1507   CL 103 03/21/2021 1507   CO2 26 03/21/2021 1507   BUN 9 03/21/2021 1507   CREATININE 0.75 03/21/2021 1507   CREATININE 0.84 07/20/2020 1557      Component Value Date/Time   CALCIUM 10.2 03/21/2021 1507   ALKPHOS 78 03/21/2021 1507   AST 25 03/21/2021 1507   ALT 49 (H) 03/21/2021 1507   BILITOT 0.3 03/21/2021 1507       Impression and Plan: Tiffany Hubbard is a very pleasant 45 yo African American female with  history of pulmonary embolism of LUL, with possible smaller subsegmental pulmonary embolism involving the LLL; no DVT in the lower extremities - diagnosed 02/2019.   She did have underlying FSGS (diagnosed 03/2019).  Again, this may have been a factor with respect to the  development of the thromboembolic disease.  Her hypercoag work up was negative.   Again, she is off Xarelto.  She is doing well off the Xarelto.  I will going put her on Xarelto while she is on vacation.  I told her to stop the Xarelto 2 days before she travels.  She will take Xarelto daily while she is down in [place] and then stop it 2 days after she returns.  I still do not think we need any scans on her.  I do not think we need any Dopplers.  I think we can now get her back after the summertime.  I will plan to get her back in September.   Volanda Napoleon, MD 6/16/20223:45 PM

## 2021-03-21 NOTE — Telephone Encounter (Signed)
Appts made per 03/21/21 los   [employer]

## 2021-03-25 ENCOUNTER — Encounter: Payer: BC Managed Care – PPO | Admitting: Gastroenterology

## 2021-03-28 DIAGNOSIS — N051 Unspecified nephritic syndrome with focal and segmental glomerular lesions: Secondary | ICD-10-CM | POA: Diagnosis not present

## 2021-03-28 DIAGNOSIS — R3129 Other microscopic hematuria: Secondary | ICD-10-CM | POA: Diagnosis not present

## 2021-03-28 DIAGNOSIS — I129 Hypertensive chronic kidney disease with stage 1 through stage 4 chronic kidney disease, or unspecified chronic kidney disease: Secondary | ICD-10-CM | POA: Diagnosis not present

## 2021-03-28 DIAGNOSIS — E785 Hyperlipidemia, unspecified: Secondary | ICD-10-CM | POA: Diagnosis not present

## 2021-05-02 NOTE — Progress Notes (Deleted)
NEUROLOGY FOLLOW UP OFFICE NOTE  JALESE LOUX [id number]  Assessment/Plan:   ***  Subjective:  Tiffany Hubbard is a 45 year old right-handed female with focal segmental glomerulosclerosis and history of PE on anticoagulation who follows up for headache and pulsatile tinnitus.  UPDATE: TSH was 1.94.  MRI of brain and MRA head and neck were personally reviewed and were normal.     HISTORY: She was tapered completely off of prednisone in September 2021 (taken for focal segmental glomerulosclerosis).  Since then, she has had a mild right temporal throbbing, rarely on the left side.  It is not really a headache.  Sometimes she notes a strange sensation on top of her head as well.  She will hear a loud heartbeat in her head.  No associated nausea, vomiting, visual disturbance, photophobia, phonophobia, numbness or weakness.  It usually happens at night and wakes her up from sleep.  It lasts a couple of hours.  Sleeping and laying her head on the opposite side helps relieve it.  Initially they occurred daily for 2 weeks and then became less frequent.  Frequency fluctuates.  Only a couple of times over the past 2 weeks.  Sed rate from October 2021 was 17.     She reports remote history of migraines up until her early 25s.    PAST MEDICAL HISTORY: Past Medical History:  Diagnosis Date   Anemia    Asthma    Bronchitis    Cardiac arrhythmia    Focal segmental glomerulosclerosis    GERD (gastroesophageal reflux disease)    HTN (hypertension)    Pulmonary emboli (HCC)     MEDICATIONS: Current Outpatient Medications on File Prior to Visit  Medication Sig Dispense Refill   acetaminophen (TYLENOL) 500 MG tablet Take 1,000 mg by mouth every 6 (six) hours as needed for moderate pain.      atorvastatin (LIPITOR) 10 MG tablet Take 10 mg by mouth daily.     docusate sodium (COLACE) 100 MG capsule Take 100 mg by mouth 2 (two) times daily. (Patient not taking: Reported on 03/21/2021)      Drospirenone (SLYND) 4 MG TABS Take 1 tablet by mouth daily. 28 tablet    fluticasone (FLONASE) 50 MCG/ACT nasal spray Place 2 sprays into both nostrils daily. (Patient taking differently: Place 2 sprays into both nostrils daily as needed for allergies.) 16 g 6   furosemide (LASIX) 20 MG tablet Take 2 tablets (40 mg total) by mouth 2 (two) times daily. (Patient not taking: Reported on 03/21/2021) 3 tablet 0   lisinopril (ZESTRIL) 20 MG tablet Take 30 mg by mouth daily.     metoprolol tartrate (LOPRESSOR) 25 MG tablet Take 25 mg by mouth 2 (two) times a day.     pantoprazole (PROTONIX) 40 MG tablet Take 40 mg by mouth daily. (Patient not taking: Reported on 03/21/2021)     PLENVU 140 g SOLR See admin instructions.     rivaroxaban (XARELTO) 2.5 MG TABS tablet Take 2 tablets (5 mg total) by mouth daily. 30 tablet 0   triamcinolone (KENALOG) 0.025 % cream Apply 1 application topically 2 (two) times daily as needed (eczema). 30 g 1   No current facility-administered medications on file prior to visit.    ALLERGIES: No Known Allergies  FAMILY HISTORY: Family History  Problem Relation Age of Onset   Lung cancer Mother    Breast cancer Mother 18   Diabetes Mother    Heart disease Father 82  Hypertension Father    Diabetes Maternal Grandmother    Stomach cancer Maternal Grandmother    Arthritis Maternal Grandfather    Colon cancer Maternal Aunt        2 aunts   Colon cancer Maternal Uncle    Cancer Maternal Aunt        spinal      Objective:  *** General: No acute distress.  Patient appears ***-groomed.   Head:  Normocephalic/atraumatic Eyes:  Fundi examined but not visualized Neck: supple, no paraspinal tenderness, full range of motion Heart:  Regular rate and rhythm Lungs:  Clear to auscultation bilaterally Back: No paraspinal tenderness Neurological Exam: alert and oriented to person, place, and time.  Speech fluent and not dysarthric, language intact.  CN II-XII intact. Bulk and  tone normal, muscle strength 5/5 throughout.  Sensation to light touch intact.  Deep tendon reflexes 2+ throughout, toes downgoing.  Finger to nose testing intact.  Gait normal, Romberg negative.   Metta Clines, DO  CC: ***

## 2021-05-03 ENCOUNTER — Ambulatory Visit: Payer: BC Managed Care – PPO | Admitting: Neurology

## 2021-05-29 ENCOUNTER — Ambulatory Visit (INDEPENDENT_AMBULATORY_CARE_PROVIDER_SITE_OTHER): Payer: BC Managed Care – PPO

## 2021-05-29 ENCOUNTER — Encounter (HOSPITAL_COMMUNITY): Payer: Self-pay

## 2021-05-29 ENCOUNTER — Ambulatory Visit (HOSPITAL_COMMUNITY)
Admission: EM | Admit: 2021-05-29 | Discharge: 2021-05-29 | Disposition: A | Discharge status: Home / Self Care | Payer: BC Managed Care – PPO | Attending: Medical Oncology | Admitting: Medical Oncology

## 2021-05-29 ENCOUNTER — Other Ambulatory Visit: Payer: Self-pay

## 2021-05-29 DIAGNOSIS — R0600 Dyspnea, unspecified: Secondary | ICD-10-CM

## 2021-05-29 DIAGNOSIS — R0602 Shortness of breath: Secondary | ICD-10-CM | POA: Diagnosis not present

## 2021-05-29 DIAGNOSIS — R079 Chest pain, unspecified: Secondary | ICD-10-CM | POA: Diagnosis not present

## 2021-05-29 IMAGING — DX DG CHEST 2V
2 series · 2 of 2 positions shown · non-contrast
Comparison: [DATE]

CLINICAL DATA: Dyspnea

EXAM:
CHEST - 2 VIEW

[chest pa]
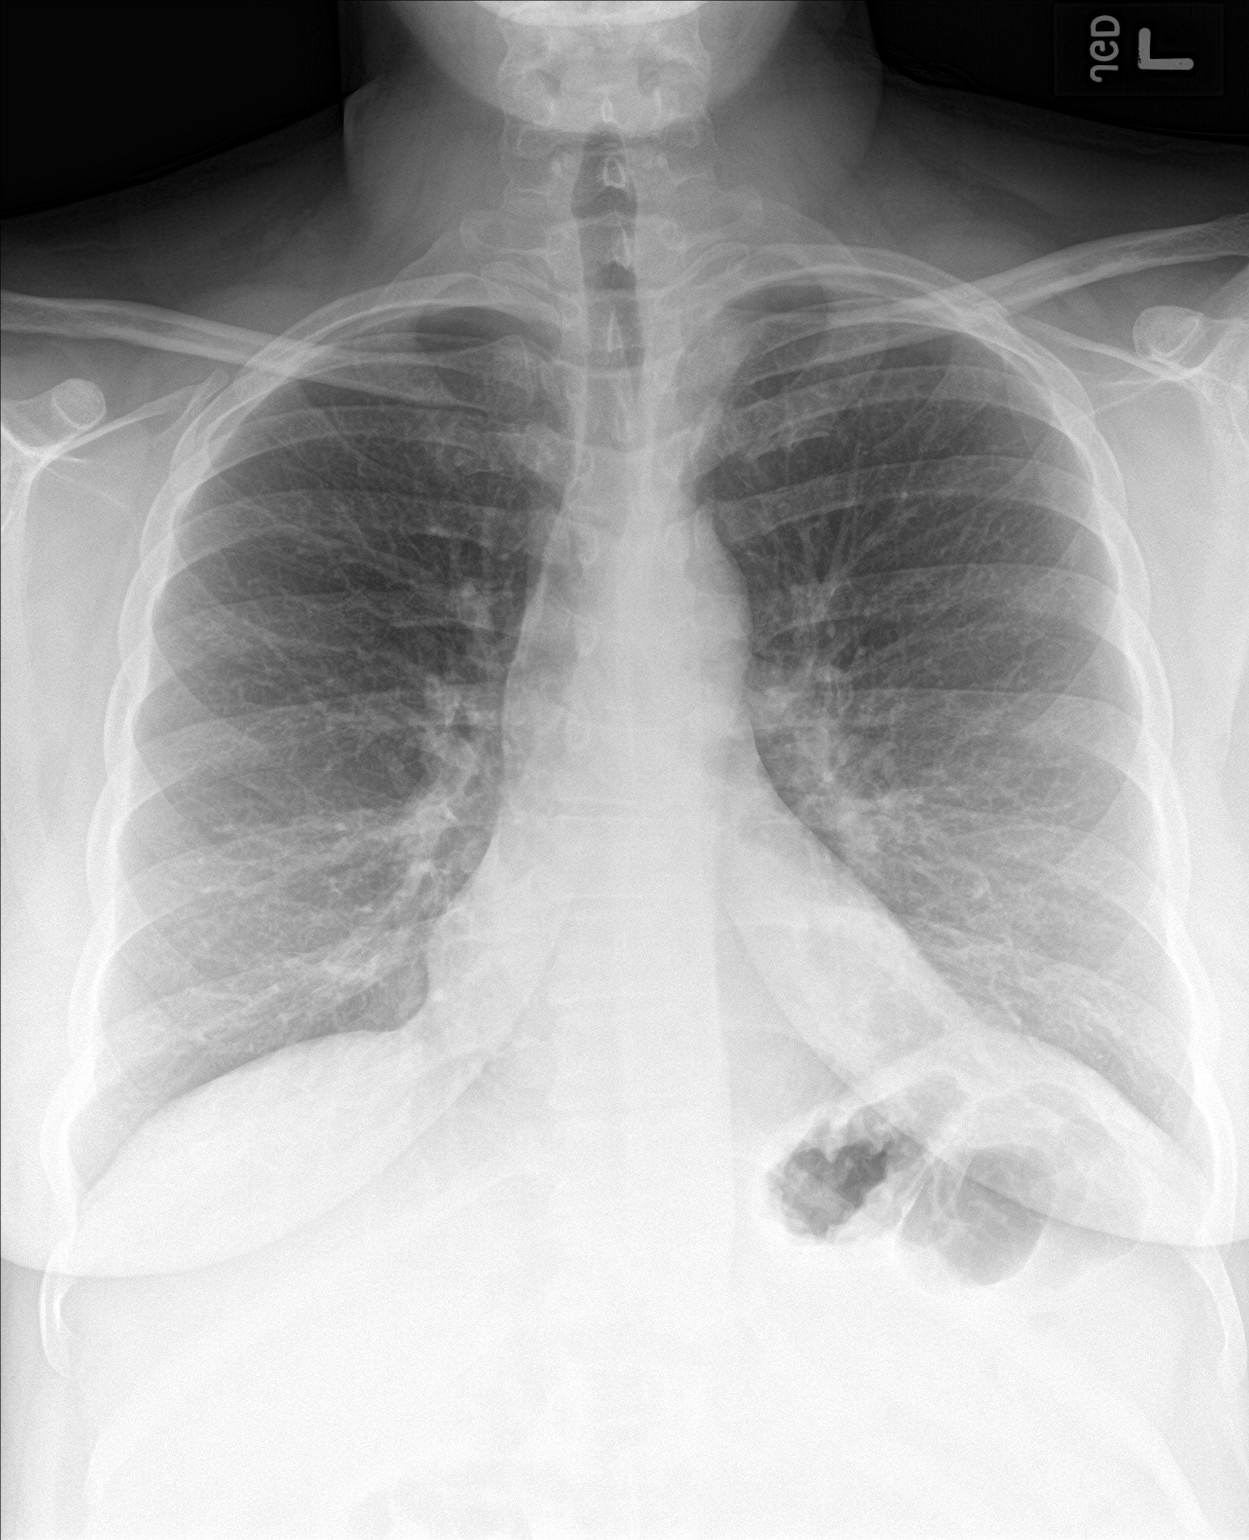

[chest lat]
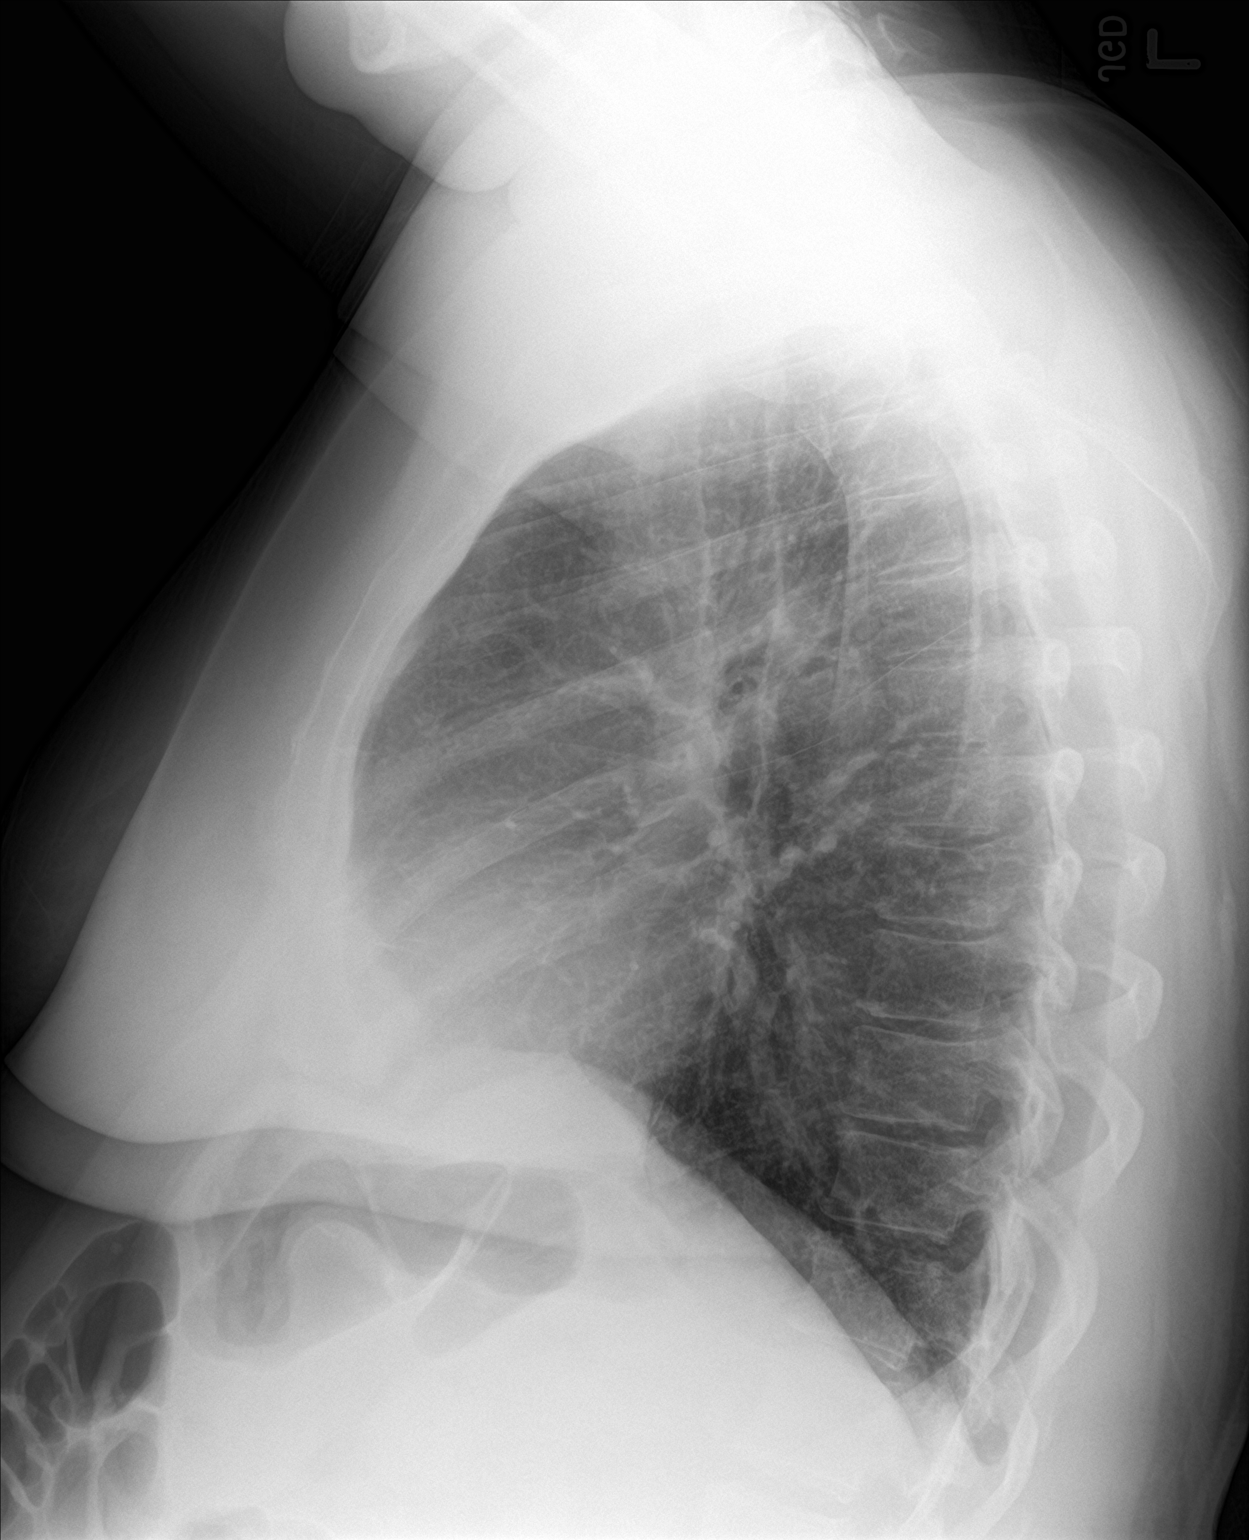

[2 of 2 positions shown; findings below may reference images not displayed]

FINDINGS: The heart size and mediastinal contours are within normal limits.
Both lungs are clear. The visualized skeletal structures are
unremarkable.
IMPRESSION: No active cardiopulmonary disease.

## 2021-05-29 MED ORDER — PANTOPRAZOLE SODIUM 40 MG PO TBEC: 40.0000 mg | DELAYED_RELEASE_TABLET | Freq: Every day | ORAL | 0 refills | Status: DC

## 2021-05-29 MED ORDER — LIDOCAINE VISCOUS HCL 2 % MT SOLN
15.0000 mL | Freq: Once | OROMUCOSAL | Status: AC
  Administered 2021-05-29: 15 mL via ORAL

## 2021-05-29 MED ORDER — ALUM & MAG HYDROXIDE-SIMETH 200-200-20 MG/5ML PO SUSP
30.0000 mL | Freq: Once | ORAL | Status: AC
  Administered 2021-05-29: 30 mL via ORAL

## 2021-05-29 MED ORDER — ALUM & MAG HYDROXIDE-SIMETH 200-200-20 MG/5ML PO SUSP
ORAL | Status: AC
  Filled 2021-05-29: qty 30

## 2021-05-29 MED ORDER — LIDOCAINE VISCOUS HCL 2 % MT SOLN
OROMUCOSAL | Status: AC
  Filled 2021-05-29: qty 15

## 2021-05-29 NOTE — ED Provider Notes (Signed)
[facility]    CSN: [id number] Arrival date & time: 05/29/21  1727      History   Chief Complaint Chief Complaint  Patient presents with   Chest Pain    HPI MONTESSA GARSKE is a 45 y.o. female.   HPI  Chest Pain: Pt reports that for the past 2 months she has noticed some SOB when walking up the stairs. Then over the past 5 days she has had chest pains. Pains occur at random when she is seated. Last under 30 seconds and resolve on their on. She has noticed some GERD. Has chronic cough. Has had a PE but she states that symptoms do no feel similar at all.   Past Medical History:  Diagnosis Date   Anemia    Asthma    Bronchitis    Cardiac arrhythmia    Focal segmental glomerulosclerosis    GERD (gastroesophageal reflux disease)    HTN (hypertension)    Pulmonary emboli [facility])     Patient Active Problem List   Diagnosis Date Noted   Iron deficiency anemia due to chronic blood loss 03/25/2019   Thrombocytosis 03/25/2019   Menorrhagia 03/25/2019   Adrenal adenoma 03/25/2019   FSGS (focal segmental glomerulosclerosis) 03/25/2019   Pulmonary embolism on left ([place]) 03/02/2019   Eczema 10/31/2013   Family history of breast cancer in mother 10/22/2012   Family history of colon cancer 10/22/2012   Screening for malignant neoplasm of cervix 10/22/2012   Thyromegaly 06/10/2012   Lymphadenopathy, submandibular 06/10/2012   Physical exam, annual 06/10/2012    Past Surgical History:  Procedure Laterality Date   ECTOPIC PREGNANCY SURGERY     TUBAL LIGATION      OB History   No obstetric history on file.      Home Medications    Prior to Admission medications   Medication Sig Start Date End Date Taking? Authorizing Provider  rivaroxaban (XARELTO) 2.5 MG TABS tablet Take 2 tablets (5 mg total) by mouth daily. 03/21/21  Yes Volanda Napoleon, MD  acetaminophen (TYLENOL) 500 MG tablet Take 1,000 mg by mouth every 6 (six) hours as needed for moderate pain.      [provider]  atorvastatin (LIPITOR) 10 MG tablet Take 10 mg by mouth daily. 10/06/20   [provider]  docusate sodium (COLACE) 100 MG capsule Take 100 mg by mouth 2 (two) times daily. Patient not taking: No sig reported 08/08/20   [provider]  Drospirenone (SLYND) 4 MG TABS Take 1 tablet by mouth daily. 07/20/20   Debbrah Alar, NP  fluticasone (FLONASE) 50 MCG/ACT nasal spray Place 2 sprays into both nostrils daily. Patient taking differently: Place 2 sprays into both nostrils daily as needed for allergies. 03/29/19   Debbrah Alar, NP  furosemide (LASIX) 20 MG tablet Take 2 tablets (40 mg total) by mouth 2 (two) times daily. Patient not taking: No sig reported 05/03/19   Debbrah Alar, NP  lisinopril (ZESTRIL) 20 MG tablet Take 30 mg by mouth daily. 08/08/19   [provider]  metoprolol tartrate (LOPRESSOR) 25 MG tablet Take 25 mg by mouth 2 (two) times a day. 03/28/19   [provider]  pantoprazole (PROTONIX) 40 MG tablet Take 40 mg by mouth daily. Patient not taking: No sig reported 06/18/20   [provider]  PLENVU 140 g SOLR See admin instructions. 12/10/20   [provider]  triamcinolone (KENALOG) 0.025 % cream Apply 1 application topically 2 (two) times daily  as needed (eczema). 03/24/19   Debbrah Alar, NP    Family History Family History  Problem Relation Age of Onset   Lung cancer Mother    Breast cancer Mother 73   Diabetes Mother    Heart disease Father 107   Hypertension Father    Diabetes Maternal Grandmother    Stomach cancer Maternal Grandmother    Arthritis Maternal Grandfather    Colon cancer Maternal Aunt        2 aunts   Colon cancer Maternal Uncle    Cancer Maternal Aunt        spinal    Social History Social History   Tobacco Use   Smoking status: Former    Types: Cigarettes    Quit date: 12/10/2008    Years since quitting: 12.4   Smokeless tobacco: Never  Vaping  Use   Vaping Use: Never used  Substance Use Topics   Alcohol use: Not Currently    Comment: occasionally wine   Drug use: No     Allergies   Patient has no known allergies.   Review of Systems Review of Systems  As stated above in HPI Physical Exam Triage Vital Signs ED Triage Vitals  Enc Vitals Group     BP 05/29/21 1821 (!) 149/81     Pulse Rate 05/29/21 1821 78     Resp 05/29/21 1821 20     Temp 05/29/21 1821 99.1 F (37.3 C)     Temp Source 05/29/21 1821 Oral     SpO2 05/29/21 1821 98 %     Weight --      Height --      Head Circumference --      Peak Flow --      Pain Score 05/29/21 1819 7     Pain Loc --      Pain Edu? --      Excl. in [place]? --    No data found.  Updated Vital Signs BP (!) 149/81 (BP Location: Right Arm)   Pulse 78   Temp 99.1 F (37.3 C) (Oral)   Resp 20   SpO2 98%   Physical Exam Vitals and nursing note reviewed.  Constitutional:      General: She is not in acute distress.    Appearance: She is well-developed. She is not ill-appearing, toxic-appearing or diaphoretic.  HENT:     Head: Normocephalic and atraumatic.  Eyes:     Extraocular Movements: Extraocular movements intact.     Pupils: Pupils are equal, round, and reactive to light.  Neck:     Vascular: No hepatojugular reflux or JVD.     Trachea: No tracheal deviation.  Cardiovascular:     Rate and Rhythm: Normal rate and regular rhythm.     Pulses:          Carotid pulses are 2+ on the right side and 2+ on the left side.    Heart sounds: Normal heart sounds.  Pulmonary:     Effort: Pulmonary effort is normal.     Breath sounds: Normal breath sounds.  Chest:     Chest wall: No mass, deformity, tenderness, crepitus or edema. There is no dullness to percussion.  Musculoskeletal:     Cervical back: Normal range of motion and neck supple.  Lymphadenopathy:     Cervical: No cervical adenopathy.  Skin:    General: Skin is warm.     Coloration: Skin is not cyanotic or  pale.  Neurological:  Mental Status: She is alert and oriented to person, place, and time.     UC Treatments / Results  Labs (all labs ordered are listed, but only abnormal results are displayed) Labs Reviewed - No data to display  EKG   Radiology No results found.  Procedures Procedures (including critical care time)  Medications Ordered in UC Medications - No data to display  Initial Impression / Assessment and Plan / UC Course  I have reviewed the triage vital signs and the nursing notes.  Pertinent labs & imaging results that were available during my care of the patient were reviewed by me and considered in my medical decision making (see chart for details).     New. EKG is reassuring. Chest x ray pending while we wait GI cocktail trial. UPDATE: Chest x ray is negative. SOB and chest pain resolved with GI cocktail. Will trial omeprazole. Low threshold for ER if needed. She has follow up with PCP on Friday which she will keep.    Final Clinical Impressions(s) / UC Diagnoses   Final diagnoses:  None   Discharge Instructions   None    ED Prescriptions   None    PDMP not reviewed this encounter.   Hughie Closs, [place] 05/29/21 1945

## 2021-05-29 NOTE — ED Triage Notes (Signed)
Pt reports shortness of breath when using the stair x 2 months; on and left sided chest pain x 5 days. States she had some left shoulder pain early today. Denies headache, dizziness, vision changes, back pain, abdominal pain, nausea, vomiting, diarrhea, weakness, fatigue.

## 2021-05-30 ENCOUNTER — Telehealth: Payer: Self-pay

## 2021-05-30 NOTE — Telephone Encounter (Signed)
Nurse Assessment Nurse: Della Goo, RN, Vicente Males Date/Time Eilene Ghazi Time): 05/29/2021 4:21:05 PM Confirm and document reason for call. If symptomatic, describe symptoms. ---Caller states she has had intermittent cp since Saturday. Sharp pain that comes and goes randomly, about 1-2 times a day. Pain is 7/10. SOB with activity. Denies cp currently. Does the patient have any new or worsening symptoms? ---Yes Will a triage be completed? ---Yes Related visit to physician within the last 2 weeks? ---No Does the PT have any chronic conditions? (i.e. diabetes, asthma, this includes High risk factors for pregnancy, etc.) ---No Is the patient pregnant or possibly pregnant? (Ask all females between the ages of 59-55) ---No Is this a behavioral health or substance abuse call? ---No Guidelines Guideline Title Affirmed Question Affirmed Notes Nurse Date/Time (Eastern Time) Chest Pain Pain also in shoulder(s) or arm(s) or jaw (Exception: pain is clearly made worse by movement) Quandt, RN, Vicente Males 05/29/2021 4:22:46 PM PLEASE NOTE: All timestamps contained within this report are represented as [place] Standard Time. CONFIDENTIALTY NOTICE: This fax transmission is intended only for the addressee. It contains information that is legally privileged, confidential or otherwise protected from use or disclosure. If you are not the intended recipient, you are strictly prohibited from reviewing, disclosing, copying using or disseminating any of this information or taking any action in reliance on or regarding this information. If you have received this fax in error, please notify us immediately by telephone so that we can arrange for its return to [place]. Phone: [phone], Toll-Free: [phone], Fax: [phone] Page: 2 of 2 Call Id: [id number] [place]. Time Eilene Ghazi Time) Disposition Final User 05/29/2021 4:18:16 PM Send to Urgent [place] Na 05/29/2021 4:25:06 PM Go to ED Now Yes Della Goo, RN, Allegra Lai  Disagree/Comply Comply Caller Understands Yes PreDisposition InappropriateToAsk Care Advice Given Per Guideline GO TO ED NOW: * You need to be seen in the Emergency Department. * Another adult should drive. BRING MEDICINES: * Bring a list of your current medicines when you go to the Emergency Department (ER). CALL EMS IF: * Severe difficulty breathing occurs * Passes out or becomes too weak to stand * You become worse CARE ADVICE given per Chest Pain (Adult) guideline. Comments User: Juanda Crumble, RN Date/Time Eilene Ghazi Time): 05/29/2021 4:23:28 PM cp lasts for 5 seconds before going away Referrals GO TO FACILITY UNDECIDED  Pt seen in ED.

## 2021-05-31 ENCOUNTER — Ambulatory Visit: Payer: BC Managed Care – PPO | Admitting: Family

## 2021-07-03 ENCOUNTER — Other Ambulatory Visit: Payer: BC Managed Care – PPO

## 2021-07-03 ENCOUNTER — Ambulatory Visit: Payer: BC Managed Care – PPO | Admitting: Family

## 2021-08-26 ENCOUNTER — Ambulatory Visit (INDEPENDENT_AMBULATORY_CARE_PROVIDER_SITE_OTHER): Payer: BC Managed Care – PPO | Admitting: Family

## 2021-08-26 ENCOUNTER — Other Ambulatory Visit: Payer: Self-pay

## 2021-08-26 ENCOUNTER — Ambulatory Visit (HOSPITAL_BASED_OUTPATIENT_CLINIC_OR_DEPARTMENT_OTHER)
Admission: RE | Admit: 2021-08-26 | Discharge: 2021-08-26 | Disposition: A | Discharge status: Home / Self Care | Payer: BC Managed Care – PPO | Source: Ambulatory Visit | Attending: Family | Admitting: Family

## 2021-08-26 VITALS — BP 147/66 | HR 78 | Temp 97.9°F | Resp 16 | Ht 66.0 in | Wt 263.0 lb

## 2021-08-26 DIAGNOSIS — M545 Low back pain, unspecified: Secondary | ICD-10-CM

## 2021-08-26 IMAGING — DX DG LUMBAR SPINE COMPLETE 4+V
5 series · 5 of 5 positions shown · non-contrast
Comparison: None.

CLINICAL DATA: severe L sided low back pain.

EXAM:
LUMBAR SPINE - COMPLETE 4+ VIEW

[l-spine ap]
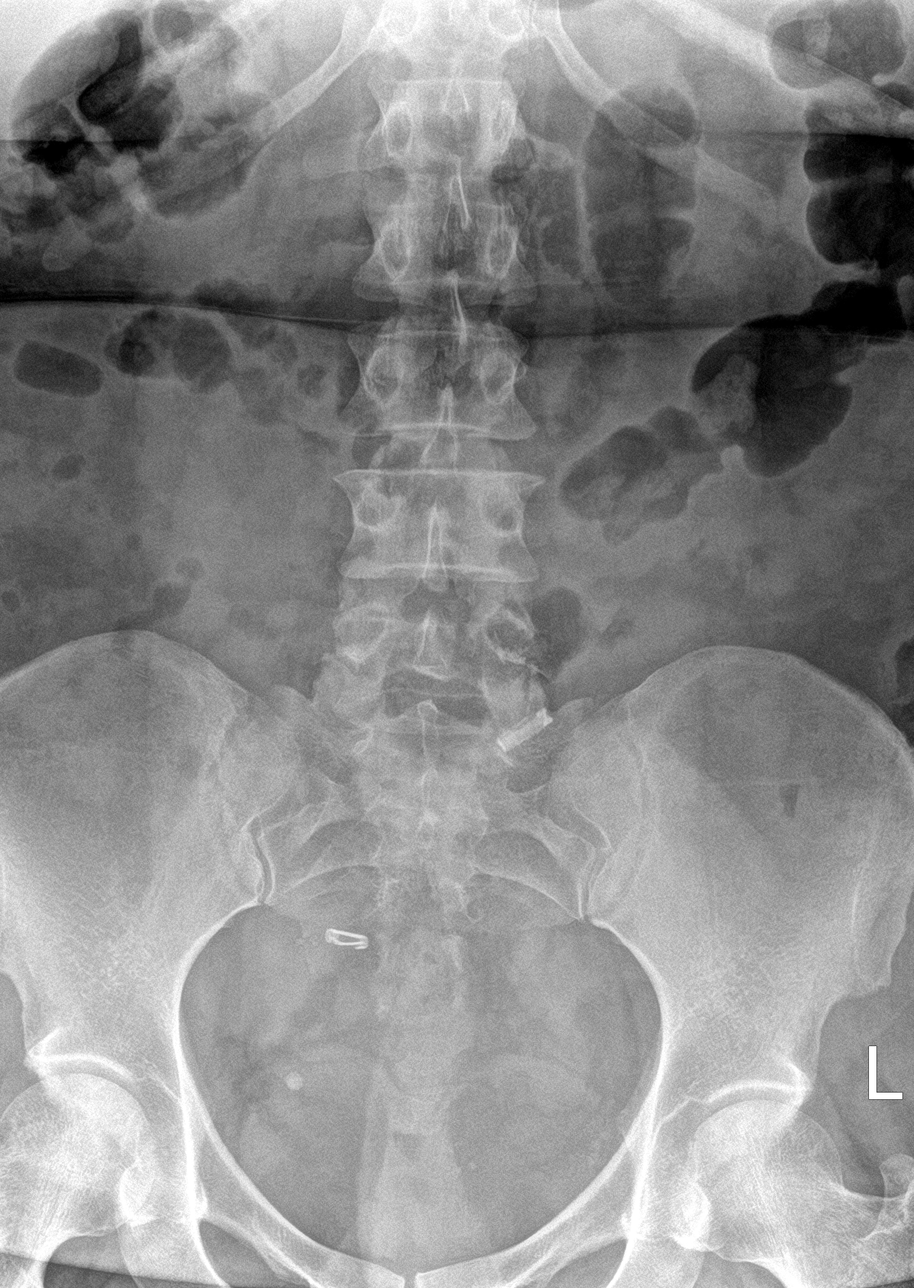

[l-spine obl (1 of 2)]
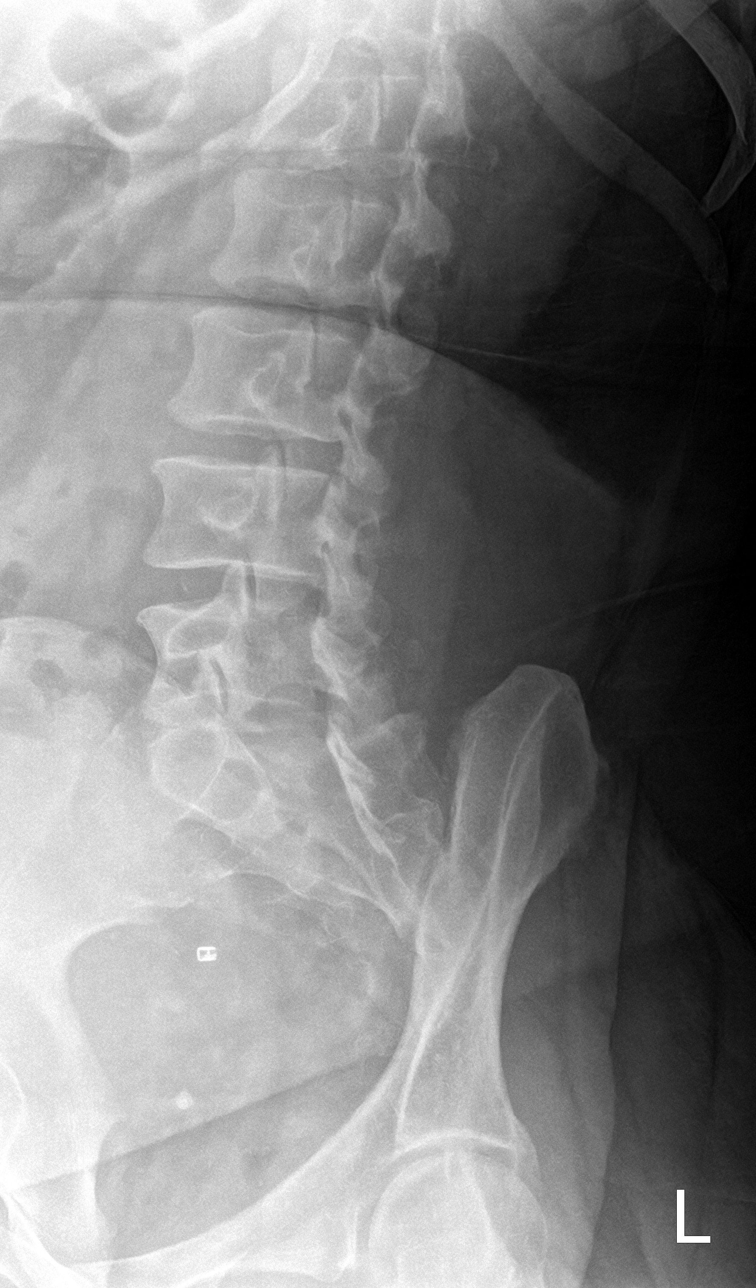

[l-spine obl (2 of 2)]
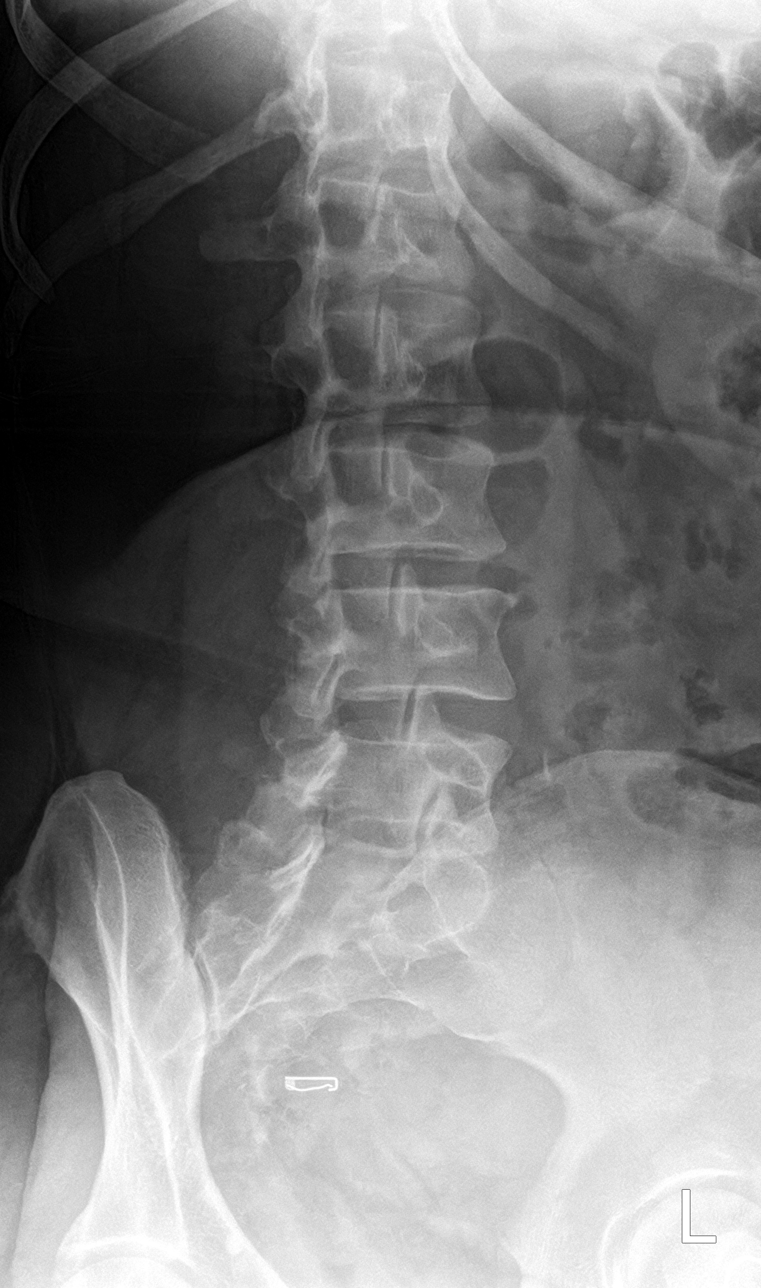

[l-spine lat]
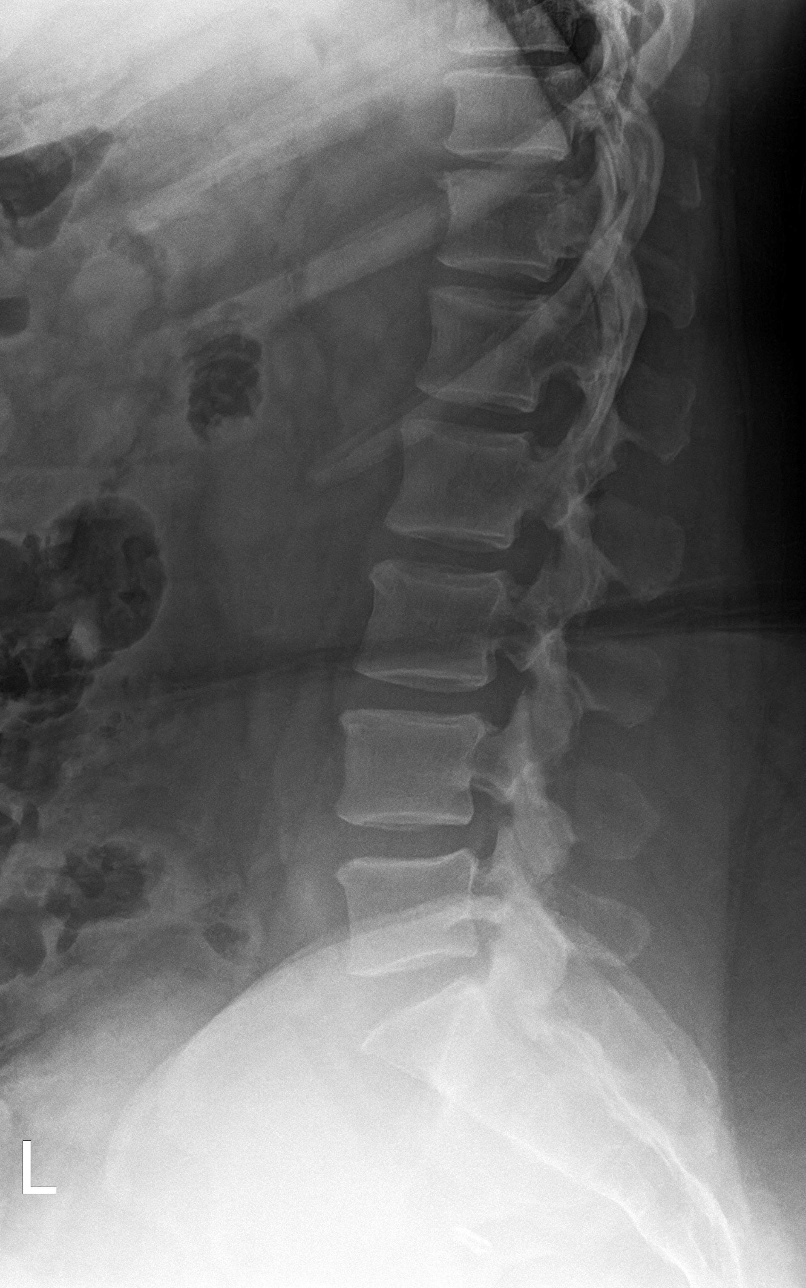

[l-spine spot]
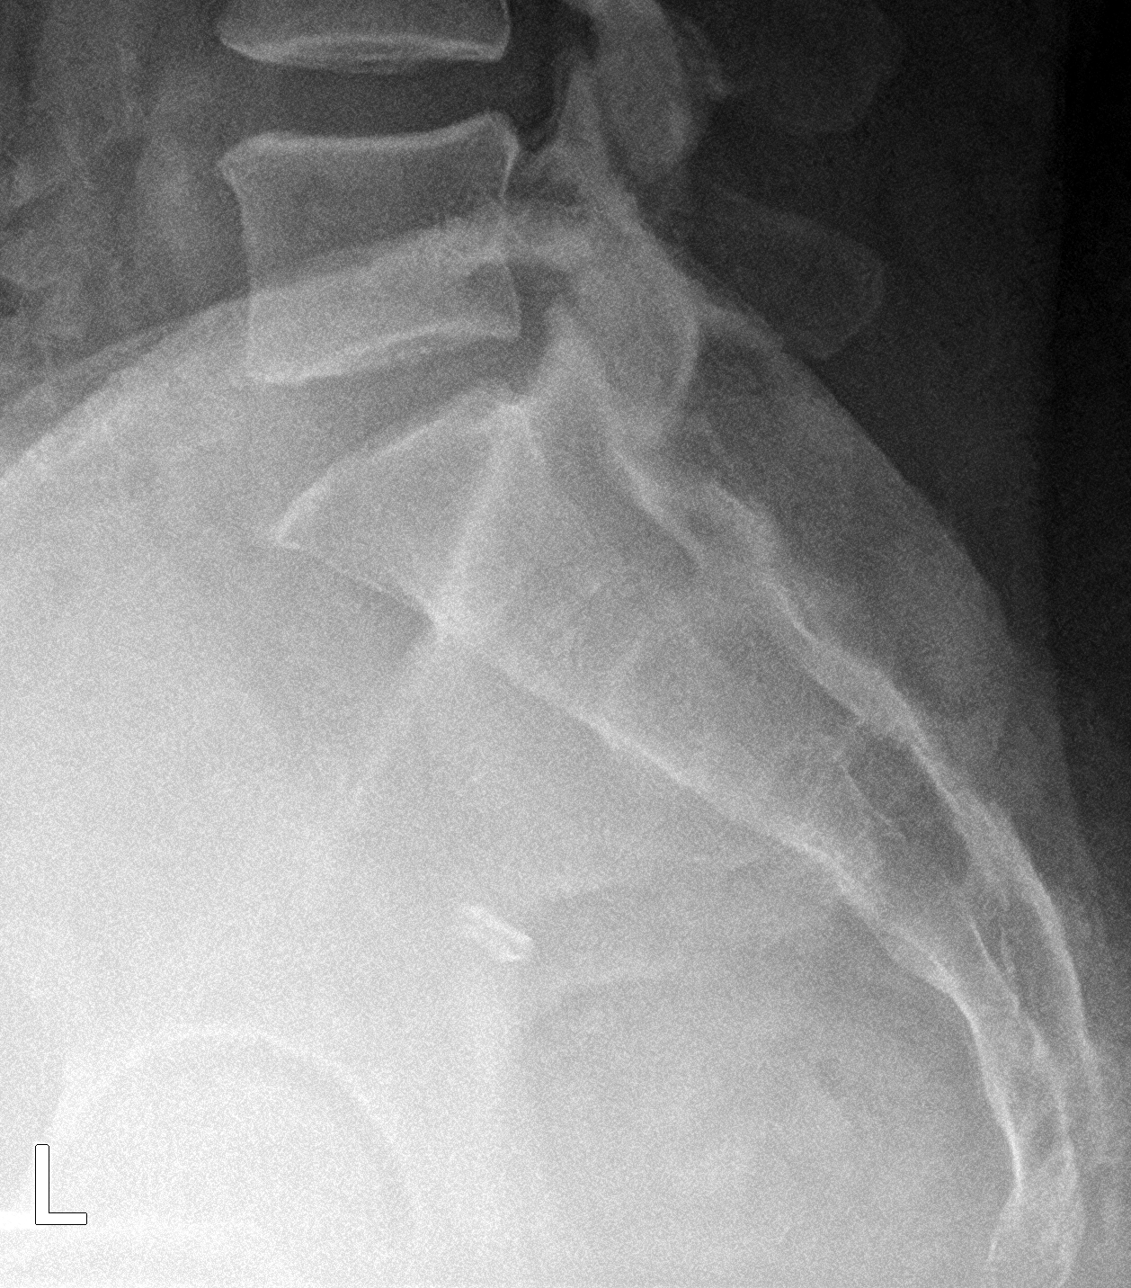

[5 of 5 positions shown; findings below may reference images not displayed]

FINDINGS: There is no evidence of lumbar spine fracture. Mild straightening of
the lumbar lordosis. Preserved disc heights. Minimal L5-S1 facet
arthropathy.
IMPRESSION: No evidence of lumbar spine fracture. Mild straightening of the
lumbar lordosis. Minimal L5-S1 facet arthropathy.

## 2021-08-26 MED ORDER — CYCLOBENZAPRINE HCL 5 MG PO TABS: 5.0000 mg | ORAL_TABLET | Freq: Three times a day (TID) | ORAL | 0 refills | Status: DC | PRN

## 2021-08-26 MED ORDER — METHYLPREDNISOLONE 4 MG PO TABS: ORAL_TABLET | ORAL | 0 refills | Status: DC

## 2021-08-26 MED ORDER — OXYCODONE-ACETAMINOPHEN 5-325 MG PO TABS: 1.0000 | ORAL_TABLET | Freq: Four times a day (QID) | ORAL | 0 refills | Status: AC | PRN

## 2021-08-26 MED ORDER — METHYLPREDNISOLONE SODIUM SUCC 125 MG IJ SOLR
125.0000 mg | Freq: Once | INTRAMUSCULAR | Status: AC
  Administered 2021-08-26: 125 mg via INTRAMUSCULAR

## 2021-08-26 NOTE — Assessment & Plan Note (Signed)
New.  Solumedrol 125mg  IM given today in clinic.   Pt is advised as follows:  Please complete x-ray on the first floor. Begin Medrol dose pak (steroid pills) this evening.  For mild/moderate pain you may use tylenol 650mg  every 6 hours. For severe pain you may use the percocet (pain medication) and flexeril (muscle relaxer). Go to ER if worsening pain, bowel bladder incontinence.  Please call if symptoms are not improved in 3-4 days.

## 2021-08-26 NOTE — Patient Instructions (Signed)
Please complete x-ray on the first floor. Begin Medrol dose paK (steroid pills) this evening.  For mild/moderate pain you may use tylenol 650mg  every 6 hours. For severe pain you may use the percocet (pain medication) and flexeril (muscle relaxer). Go to ER if worsening pain, bowel bladder incontinence.  Please call if symptoms are not improved in 3-4 days.

## 2021-08-26 NOTE — Progress Notes (Addendum)
Subjective:   By signing my name below, I, Shehryar Baig, attest that this documentation has been prepared under the direction and in the presence of Debbrah Alar NP. 08/26/2021     Patient ID: Tiffany Hubbard, female    DOB: 05-08-1976, 45 y.o.   MRN: [id number]  Chief Complaint  Patient presents with   Back Pain    Complains of severe low back  pain since yesterday evening    Back Pain Associated symptoms include weakness (bilateral lower extremities).  Patient is in today for a office visit.   Back pain- She complains of low back pain since yesterday. She was bending down to pick up something from the oven when she experienced sharp pain in her lower back. She was fine to walk to the couch but when she got up for water she found the pain unbearable. She has weakness in her legs while walking and feels like they might buckle. She experienced low back pain prior when coughing hard but notes the pain was much less severe compared to her current pain. She denies having any loss of bowel or bladder control.    Health Maintenance Due  Topic Date Due   PAP SMEAR-Modifier  11/18/2018   COLONOSCOPY (Pts 45-14yrs Insurance coverage will need to be confirmed)  Never done   COVID-19 Vaccine (3 - Booster for Enterprise series) 04/04/2020   MAMMOGRAM  12/08/2020   INFLUENZA VACCINE  05/06/2021    Past Medical History:  Diagnosis Date   Anemia    Asthma    Bronchitis    Cardiac arrhythmia    Focal segmental glomerulosclerosis    GERD (gastroesophageal reflux disease)    HTN (hypertension)    Pulmonary emboli (HCC)     Past Surgical History:  Procedure Laterality Date   ECTOPIC PREGNANCY SURGERY     TUBAL LIGATION      Family History  Problem Relation Age of Onset   Lung cancer Mother    Breast cancer Mother 28   Diabetes Mother    Heart disease Father 70   Hypertension Father    Diabetes Maternal Grandmother    Stomach cancer Maternal Grandmother    Arthritis Maternal  Grandfather    Colon cancer Maternal Aunt        2 aunts   Colon cancer Maternal Uncle    Cancer Maternal Aunt        spinal    Social History   Socioeconomic History   Marital status: Married    Spouse name: Not on file   Number of children: 0   Years of education: Not on file   Highest education level: Not on file  Occupational History   Occupation: Theme park manager: [employer]    Comment: [employer]   Occupation: control review  Tobacco Use   Smoking status: Former    Types: Cigarettes    Quit date: 12/10/2008    Years since quitting: 12.7   Smokeless tobacco: Never  Vaping Use   Vaping Use: Never used  Substance and Sexual Activity   Alcohol use: Not Currently    Comment: occasionally wine   Drug use: No   Sexual activity: Yes    Partners: Male    Birth control/protection: Surgical  Other Topics Concern   Not on file  Social History Narrative   No children   Step daughter- does not live with patient   Married   1 dog shitzu   Enjoys movies, books, travelling  Completed bachelors degree   Works for [employer] from home   Social Determinants of Health   Financial Resource Strain: Not on file  Food Insecurity: Not on file  Transportation Needs: Not on file  Physical Activity: Not on file  Stress: Not on file  Social Connections: Not on file  Intimate Partner Violence: Not on file    Outpatient Medications Prior to Visit  Medication Sig Dispense Refill   acetaminophen (TYLENOL) 500 MG tablet Take 1,000 mg by mouth every 6 (six) hours as needed for moderate pain.      atorvastatin (LIPITOR) 10 MG tablet Take 10 mg by mouth daily.     docusate sodium (COLACE) 100 MG capsule Take 100 mg by mouth 2 (two) times daily.     Drospirenone (SLYND) 4 MG TABS Take 1 tablet by mouth daily. 28 tablet    fluticasone (FLONASE) 50 MCG/ACT nasal spray Place 2 sprays into both nostrils daily. (Patient taking differently: Place 2 sprays into both nostrils  daily as needed for allergies.) 16 g 6   furosemide (LASIX) 20 MG tablet Take 2 tablets (40 mg total) by mouth 2 (two) times daily. 3 tablet 0   lisinopril (ZESTRIL) 20 MG tablet Take 30 mg by mouth daily.     metoprolol tartrate (LOPRESSOR) 25 MG tablet Take 25 mg by mouth 2 (two) times a day.     pantoprazole (PROTONIX) 40 MG tablet Take 1 tablet (40 mg total) by mouth daily. 14 tablet 0   PLENVU 140 g SOLR See admin instructions.     rivaroxaban (XARELTO) 2.5 MG TABS tablet Take 2 tablets (5 mg total) by mouth daily. 30 tablet 0   triamcinolone (KENALOG) 0.025 % cream Apply 1 application topically 2 (two) times daily as needed (eczema). 30 g 1   No facility-administered medications prior to visit.    No Known Allergies  Review of Systems  Gastrointestinal:        (+)loss of bowel control  Genitourinary:        (+)loss of bladder control  Musculoskeletal:  Positive for back pain (Low).  Neurological:  Positive for weakness (bilateral lower extremities).      Objective:    Physical Exam Constitutional:      General: She is in acute distress (tearful, appears to be in severe pain).     Appearance: Normal appearance. She is not ill-appearing.  HENT:     Head: Normocephalic and atraumatic.     Right Ear: External ear normal.     Left Ear: External ear normal.  Eyes:     Extraocular Movements: Extraocular movements intact.     Pupils: Pupils are equal, round, and reactive to light.  Cardiovascular:     Rate and Rhythm: Normal rate and regular rhythm.     Heart sounds: Normal heart sounds. No murmur heard.   No gallop.  Pulmonary:     Effort: Pulmonary effort is normal. No respiratory distress.     Breath sounds: Normal breath sounds. No wheezing or rales.  Musculoskeletal:     Lumbar back: Spasms present.     Comments: 5/5 strength in right lower extremity Left lower extremity strength limited due to pain  Skin:    General: Skin is warm and dry.  Neurological:      Mental Status: She is alert and oriented to person, place, and time.     Deep Tendon Reflexes:     Reflex Scores:      Patellar  reflexes are 2+ on the right side and 2+ on the left side. Psychiatric:        Behavior: Behavior normal.    BP (!) 147/66 (BP Location: Right Arm, Patient Position: Sitting, Cuff Size: Large)   Pulse 78   Temp 97.9 F (36.6 C) (Oral)   Resp 16   Ht 5\' 6"  (1.676 m)   Wt 263 lb (119.3 kg)   SpO2 99%   BMI 42.45 kg/m  Wt Readings from Last 3 Encounters:  08/26/21 263 lb (119.3 kg)  03/21/21 264 lb (119.7 kg)  01/30/21 266 lb (120.7 kg)       Assessment & Plan:   Problem List Items Addressed This Visit       Unprioritized   Severe low back pain - Primary    New.  Solumedrol 125mg  IM given today in clinic.   Pt is advised as follows:  Please complete x-ray on the first floor. Begin Medrol dose pak (steroid pills) this evening.  For mild/moderate pain you may use tylenol 650mg  every 6 hours. For severe pain you may use the percocet (pain medication) and flexeril (muscle relaxer). Go to ER if worsening pain, bowel bladder incontinence.  Please call if symptoms are not improved in 3-4 days.        Relevant Medications   methylPREDNISolone (MEDROL) 4 MG tablet   cyclobenzaprine (FLEXERIL) 5 MG tablet   oxyCODONE-acetaminophen (PERCOCET/ROXICET) 5-325 MG tablet   methylPREDNISolone sodium succinate (SOLU-MEDROL) 125 mg/2 mL injection 125 mg (Start on 08/26/2021 11:15 AM)   Other Relevant Orders   DG Lumbar Spine Complete   Addendum:  Patient is unable to work at this time due to severe pain limiting her mobility. Plan return to work on 12/1. Will consider referral to physical therapy if symptoms fail to improve.  Lumbar x-ray is obtained and notes the following:  IMPRESSION: No evidence of lumbar spine fracture. Mild straightening of the lumbar lordosis. Minimal L5-S1 facet arthropathy.  Meds ordered this encounter  Medications    methylPREDNISolone (MEDROL) 4 MG tablet    Sig: Please take per package instructions    Dispense:  21 tablet    Refill:  0    Order Specific Question:   Supervising Provider    Answer:   Penni Homans A [[id number]]   cyclobenzaprine (FLEXERIL) 5 MG tablet    Sig: Take 1 tablet (5 mg total) by mouth 3 (three) times daily as needed for muscle spasms.    Dispense:  30 tablet    Refill:  0    Order Specific Question:   Supervising Provider    Answer:   Penni Homans A [[id number]]   oxyCODONE-acetaminophen (PERCOCET/ROXICET) 5-325 MG tablet    Sig: Take 1 tablet by mouth every 6 (six) hours as needed for up to 5 days for severe pain.    Dispense:  20 tablet    Refill:  0    Order Specific Question:   Supervising Provider    Answer:   Penni Homans A [[id number]]   methylPREDNISolone sodium succinate (SOLU-MEDROL) 125 mg/2 mL injection 125 mg    I, Debbrah Alar NP, personally preformed the services described in this documentation.  All medical record entries made by the scribe were at my direction and in my presence.  I have reviewed the chart and discharge instructions (if applicable) and agree that the record reflects my personal performance and is accurate and complete. 08/26/2021   I,Shehryar Baig,acting as a Education administrator for [name],  NP.,have documented all relevant documentation on the behalf of [name], NP,as directed by  [name], NP while in the presence of [name], NP.   [name], NP

## 2021-09-02 ENCOUNTER — Telehealth: Payer: Self-pay | Admitting: Family

## 2021-09-02 NOTE — Telephone Encounter (Signed)
Pt stated she is still having back pain and would like to know if Lenna Sciara would write her out for a couple more days until Thurs 12/1 . Please advise.

## 2021-09-02 NOTE — Telephone Encounter (Signed)
Letter done and sent to patient on her mychart at her request. Will go back to work12-10-2020

## 2021-09-02 NOTE — Telephone Encounter (Signed)
Yes, ok to extend note for work through 12/1.

## 2021-09-05 ENCOUNTER — Telehealth: Payer: Self-pay | Admitting: Family

## 2021-09-05 NOTE — Telephone Encounter (Signed)
Noted, have not seen FMLA forms yet

## 2021-09-05 NOTE — Telephone Encounter (Signed)
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

## 2021-09-09 NOTE — Telephone Encounter (Signed)
Pt called and stated to fill out as much information as possible that's from that list. That is all her job requires.

## 2021-09-09 NOTE — Telephone Encounter (Signed)
Patient advised, forms received are not an FMLA for or a disability form. It is a document asking for ov notes. She will confirm with her HR department to see if this is all they need and call back.

## 2021-09-10 NOTE — Telephone Encounter (Signed)
Patient advised we will work on her forms this week and fax to [facility] when ready.

## 2021-10-11 ENCOUNTER — Telehealth: Payer: Self-pay | Admitting: Hematology & Oncology

## 2021-10-15 ENCOUNTER — Inpatient Hospital Stay (HOSPITAL_BASED_OUTPATIENT_CLINIC_OR_DEPARTMENT_OTHER): Payer: BC Managed Care – PPO | Admitting: Family

## 2021-10-15 ENCOUNTER — Other Ambulatory Visit: Payer: Self-pay

## 2021-10-15 ENCOUNTER — Inpatient Hospital Stay: Payer: BC Managed Care – PPO | Attending: Family

## 2021-10-15 ENCOUNTER — Encounter: Payer: Self-pay | Admitting: Family

## 2021-10-15 VITALS — BP 148/80 | HR 85 | Temp 98.4°F | Resp 18 | Ht 66.0 in | Wt 257.1 lb

## 2021-10-15 DIAGNOSIS — D500 Iron deficiency anemia secondary to blood loss (chronic): Secondary | ICD-10-CM | POA: Insufficient documentation

## 2021-10-15 DIAGNOSIS — Z79899 Other long term (current) drug therapy: Secondary | ICD-10-CM | POA: Diagnosis not present

## 2021-10-15 DIAGNOSIS — N920 Excessive and frequent menstruation with regular cycle: Secondary | ICD-10-CM | POA: Insufficient documentation

## 2021-10-15 DIAGNOSIS — Z7901 Long term (current) use of anticoagulants: Secondary | ICD-10-CM | POA: Insufficient documentation

## 2021-10-15 DIAGNOSIS — R5383 Other fatigue: Secondary | ICD-10-CM | POA: Diagnosis not present

## 2021-10-15 DIAGNOSIS — I2699 Other pulmonary embolism without acute cor pulmonale: Secondary | ICD-10-CM

## 2021-10-15 DIAGNOSIS — Z86711 Personal history of pulmonary embolism: Secondary | ICD-10-CM | POA: Diagnosis not present

## 2021-10-15 DIAGNOSIS — R200 Anesthesia of skin: Secondary | ICD-10-CM | POA: Diagnosis not present

## 2021-10-15 DIAGNOSIS — R202 Paresthesia of skin: Secondary | ICD-10-CM | POA: Insufficient documentation

## 2021-10-15 LAB — CBC WITH DIFFERENTIAL (CANCER CENTER ONLY)
Abs Immature Granulocytes: 0.01 10*3/uL (ref 0.00–0.07)
Basophils Absolute: 0 10*3/uL (ref 0.0–0.1)
Basophils Relative: 1 %
Eosinophils Absolute: 0 10*3/uL (ref 0.0–0.5)
Eosinophils Relative: 1 %
HCT: 42.2 % (ref 36.0–46.0)
Hemoglobin: 14 g/dL (ref 12.0–15.0)
Immature Granulocytes: 0 %
Lymphocytes Relative: 31 %
Lymphs Abs: 1.6 10*3/uL (ref 0.7–4.0)
MCH: 29.1 pg (ref 26.0–34.0)
MCHC: 33.2 g/dL (ref 30.0–36.0)
MCV: 87.7 fL (ref 80.0–100.0)
Monocytes Absolute: 0.4 10*3/uL (ref 0.1–1.0)
Monocytes Relative: 8 %
Neutro Abs: 3.1 10*3/uL (ref 1.7–7.7)
Neutrophils Relative %: 59 %
Platelet Count: 413 10*3/uL — ABNORMAL HIGH (ref 150–400)
RBC: 4.81 MIL/uL (ref 3.87–5.11)
RDW: 13.8 % (ref 11.5–15.5)
WBC Count: 5.2 10*3/uL (ref 4.0–10.5)
nRBC: 0 % (ref 0.0–0.2)

## 2021-10-15 LAB — CMP (CANCER CENTER ONLY)
ALT: 21 U/L (ref 0–44)
AST: 17 U/L (ref 15–41)
Albumin: 4.6 g/dL (ref 3.5–5.0)
Alkaline Phosphatase: 83 U/L (ref 38–126)
Anion gap: 9 (ref 5–15)
BUN: 8 mg/dL (ref 6–20)
CO2: 25 mmol/L (ref 22–32)
Calcium: 10.3 mg/dL (ref 8.9–10.3)
Chloride: 106 mmol/L (ref 98–111)
Creatinine: 0.77 mg/dL (ref 0.44–1.00)
GFR, Estimated: >60 mL/min (ref >60)
Glucose, Bld: 100 mg/dL — ABNORMAL HIGH (ref 70–99)
Potassium: 3.9 mmol/L (ref 3.5–5.1)
Sodium: 140 mmol/L (ref 135–145)
Total Bilirubin: 0.4 mg/dL (ref 0.3–1.2)
Total Protein: 7.7 g/dL (ref 6.5–8.1)

## 2021-10-15 LAB — D-DIMER, QUANTITATIVE: D-Dimer, Quant: 0.3 ug/mL-FEU (ref 0.00–0.50)

## 2021-10-15 NOTE — Progress Notes (Signed)
Hematology and Oncology Follow Up Visit  Tiffany Hubbard [id number] 01-10-1976 46 y.o. 10/15/2021   Principle Diagnosis:  Acute unprovoked PTE of LUL, with possible smaller subsegmental PTE involving LLL; no DVT in the lower extremities - diagnosed 02/2019, hyper coag work up negative Underlying FSGS Iron deficiency anemia secondary to menorrhagia    Current Therapy:        Maintenance Xarelto 10 mg PO daily -d/c on 01/30/2021 IV iron as indicated        Interim History:  Tiffany Hubbard is here today for follow-up. She notes fatigue and states that her cycles has been irregular last 6-7 weeks at a time. She is currently on Slynd oral contraceptive which she states has helped make her flow lighter.  No other blood loss noted. No abnormal bruising, no petechiae.  She is cold natured and noted intermittent numbness and tingling in her left pinky.  No swelling or tenderness in her extremities.  No falls or syncope to report.  No fever, n/v, cough, rash, dizziness, SOB, chest pain, palpitations, abdominal pain or changes in bowel or bladder habits.  She has a good appetite and is doing her best to stay well hydrated throughout the day. Her weight is stable at 257 lbs.   ECOG Performance Status: 1 - Symptomatic but completely ambulatory  Medications:  Allergies as of 10/15/2021   No Known Allergies      Medication List        Accurate as of October 15, 2021  2:39 PM. If you have any questions, ask your nurse or doctor.          acetaminophen 500 MG tablet Commonly known as: TYLENOL Take 1,000 mg by mouth every 6 (six) hours as needed for moderate pain.   atorvastatin 10 MG tablet Commonly known as: LIPITOR Take 10 mg by mouth daily.   cyclobenzaprine 5 MG tablet Commonly known as: FLEXERIL Take 1 tablet (5 mg total) by mouth 3 (three) times daily as needed for muscle spasms.   docusate sodium 100 MG capsule Commonly known as: COLACE Take 100 mg by mouth 2 (two) times  daily.   fluticasone 50 MCG/ACT nasal spray Commonly known as: FLONASE Place 2 sprays into both nostrils daily. What changed:  when to take this reasons to take this   furosemide 20 MG tablet Commonly known as: LASIX Take 2 tablets (40 mg total) by mouth 2 (two) times daily.   lisinopril 20 MG tablet Commonly known as: ZESTRIL Take 30 mg by mouth daily.   methylPREDNISolone 4 MG tablet Commonly known as: Medrol Please take per package instructions   metoprolol tartrate 25 MG tablet Commonly known as: LOPRESSOR Take 25 mg by mouth 2 (two) times a day.   pantoprazole 40 MG tablet Commonly known as: Protonix Take 1 tablet (40 mg total) by mouth daily.   Plenvu 140 g Solr Generic drug: PEG-KCl-NaCl-NaSulf-Na Asc-C See admin instructions.   rivaroxaban 2.5 MG Tabs tablet Commonly known as: XARELTO Take 2 tablets (5 mg total) by mouth daily.   Slynd 4 MG Tabs Generic drug: Drospirenone Take 1 tablet by mouth daily.   triamcinolone 0.025 % cream Commonly known as: KENALOG Apply 1 application topically 2 (two) times daily as needed (eczema).        Allergies: No Known Allergies  Past Medical History, Surgical history, Social history, and Family History were reviewed and updated.  Review of Systems: All other 10 point review of systems is negative.   Physical Exam:  vitals were not taken for this visit.   Wt Readings from Last 3 Encounters:  08/26/21 263 lb (119.3 kg)  03/21/21 264 lb (119.7 kg)  01/30/21 266 lb (120.7 kg)    Ocular: Sclerae unicteric, pupils equal, round and reactive to light Ear-nose-throat: Oropharynx clear, dentition fair Lymphatic: No cervical or supraclavicular adenopathy Lungs no rales or rhonchi, good excursion bilaterally Heart regular rate and rhythm, no murmur appreciated Abd soft, nontender, positive bowel sounds MSK no focal spinal tenderness, no joint edema Neuro: non-focal, well-oriented, appropriate affect Breasts:  Deferred   Lab Results  Component Value Date   WBC 4.8 03/21/2021   HGB 13.5 03/21/2021   HCT 41.1 03/21/2021   MCV 86.2 03/21/2021   PLT 421 (H) 03/21/2021   Lab Results  Component Value Date   FERRITIN 16 01/30/2021   IRON 39 (L) 01/30/2021   TIBC 383 01/30/2021   UIBC 344 01/30/2021   IRONPCTSAT 10 (L) 01/30/2021   Lab Results  Component Value Date   RBC 4.77 03/21/2021   Lab Results  Component Value Date   KPAFRELGTCHN 22.8 (H) 08/26/2019   LAMBDASER 13.5 08/26/2019   KAPLAMBRATIO 1.69 (H) 08/26/2019   Lab Results  Component Value Date   IGGSERUM 882 08/26/2019   IGA 167 08/26/2019   IGMSERUM 131 08/26/2019   Lab Results  Component Value Date   TOTALPROTELP 6.4 08/26/2019     Chemistry      Component Value Date/Time   NA 137 03/21/2021 1507   K 4.3 03/21/2021 1507   CL 103 03/21/2021 1507   CO2 26 03/21/2021 1507   BUN 9 03/21/2021 1507   CREATININE 0.75 03/21/2021 1507   CREATININE 0.84 07/20/2020 1557      Component Value Date/Time   CALCIUM 10.2 03/21/2021 1507   ALKPHOS 78 03/21/2021 1507   AST 25 03/21/2021 1507   ALT 49 (H) 03/21/2021 1507   BILITOT 0.3 03/21/2021 1507       Impression and Plan: Tiffany Hubbard is a very pleasant 46 yo African American female with history of PE involving the LUL with possible smaller subsegmental PE involving the LLL, diagnosed 02/2019.  She has underlying FSGS which may have contributed to her developing the pulmonary emboli. Her hypercoag work up was negative.  She completed treatment with Xarelto and now only takes low dose when travelling. She has done well and so far there has been no evidence of recurrence.  Iron studies are pending. We will replace if needed.  Follow-up in 6 months.   Tiffany Dawson, NP 1/10/20232:39 PM

## 2021-10-16 LAB — IRON AND IRON BINDING CAPACITY (CC-WL,HP ONLY)
Iron: 89 ug/dL (ref 28–170)
Saturation Ratios: 23 % (ref 10.4–31.8)
TIBC: 381 ug/dL (ref 250–450)
UIBC: 292 ug/dL (ref 148–442)

## 2021-10-16 LAB — FERRITIN: Ferritin: 97 ng/mL (ref 11–307)

## 2021-10-17 ENCOUNTER — Telehealth: Payer: Self-pay | Admitting: *Deleted

## 2021-10-17 NOTE — Telephone Encounter (Signed)
Per 10/15/21 los - called and was unable to lvm - mailbox is full - mailed calendar

## 2021-10-18 DIAGNOSIS — E785 Hyperlipidemia, unspecified: Secondary | ICD-10-CM | POA: Diagnosis not present

## 2021-10-18 DIAGNOSIS — N051 Unspecified nephritic syndrome with focal and segmental glomerular lesions: Secondary | ICD-10-CM | POA: Diagnosis not present

## 2021-10-18 LAB — CBC AND DIFFERENTIAL
HCT: 43 (ref 36–46)
Hemoglobin: 14.1 (ref 12.0–16.0)
Platelets: 422 — AB (ref 150–399)
WBC: 4.8

## 2021-10-18 LAB — LIPID PANEL
Cholesterol: 146 (ref 0–200)
HDL: 31 — AB (ref 35–70)
LDL Cholesterol: 96
Triglycerides: 99 (ref 40–160)

## 2021-10-18 LAB — BASIC METABOLIC PANEL
BUN: 9 (ref 4–21)
Chloride: 104 (ref 99–108)
Creatinine: 0.6 (ref 0.5–1.1)
Glucose: 83
Sodium: 138 (ref 137–147)

## 2021-10-18 LAB — COMPREHENSIVE METABOLIC PANEL
Albumin: 4.5 (ref 3.5–5.0)
Calcium: 9.6 (ref 8.7–10.7)

## 2021-10-18 LAB — CBC: RBC: 4.95 (ref 3.87–5.11)

## 2021-10-23 DIAGNOSIS — R3129 Other microscopic hematuria: Secondary | ICD-10-CM | POA: Diagnosis not present

## 2021-10-23 DIAGNOSIS — I129 Hypertensive chronic kidney disease with stage 1 through stage 4 chronic kidney disease, or unspecified chronic kidney disease: Secondary | ICD-10-CM | POA: Diagnosis not present

## 2021-10-23 DIAGNOSIS — N051 Unspecified nephritic syndrome with focal and segmental glomerular lesions: Secondary | ICD-10-CM | POA: Diagnosis not present

## 2021-10-23 DIAGNOSIS — E785 Hyperlipidemia, unspecified: Secondary | ICD-10-CM | POA: Diagnosis not present

## 2021-10-30 DIAGNOSIS — M792 Neuralgia and neuritis, unspecified: Secondary | ICD-10-CM | POA: Diagnosis not present

## 2021-10-30 DIAGNOSIS — M722 Plantar fascial fibromatosis: Secondary | ICD-10-CM | POA: Diagnosis not present

## 2021-10-30 DIAGNOSIS — M25371 Other instability, right ankle: Secondary | ICD-10-CM | POA: Diagnosis not present

## 2021-11-06 DIAGNOSIS — D259 Leiomyoma of uterus, unspecified: Secondary | ICD-10-CM | POA: Diagnosis not present

## 2021-11-06 DIAGNOSIS — R809 Proteinuria, unspecified: Secondary | ICD-10-CM | POA: Diagnosis not present

## 2021-11-06 DIAGNOSIS — R300 Dysuria: Secondary | ICD-10-CM | POA: Diagnosis not present

## 2021-11-06 DIAGNOSIS — M545 Low back pain, unspecified: Secondary | ICD-10-CM | POA: Diagnosis not present

## 2021-11-20 DIAGNOSIS — M792 Neuralgia and neuritis, unspecified: Secondary | ICD-10-CM | POA: Diagnosis not present

## 2021-12-11 DIAGNOSIS — N920 Excessive and frequent menstruation with regular cycle: Secondary | ICD-10-CM | POA: Diagnosis not present

## 2021-12-11 DIAGNOSIS — D259 Leiomyoma of uterus, unspecified: Secondary | ICD-10-CM | POA: Diagnosis not present

## 2021-12-12 DIAGNOSIS — N840 Polyp of corpus uteri: Secondary | ICD-10-CM | POA: Diagnosis not present

## 2021-12-12 DIAGNOSIS — N920 Excessive and frequent menstruation with regular cycle: Secondary | ICD-10-CM | POA: Diagnosis not present

## 2022-01-17 ENCOUNTER — Other Ambulatory Visit: Payer: Self-pay | Admitting: Obstetrics & Gynecology

## 2022-01-28 ENCOUNTER — Telehealth: Payer: Self-pay

## 2022-01-28 ENCOUNTER — Other Ambulatory Visit: Payer: Self-pay

## 2022-01-28 DIAGNOSIS — I2699 Other pulmonary embolism without acute cor pulmonale: Secondary | ICD-10-CM

## 2022-01-28 MED ORDER — RIVAROXABAN 10 MG PO TABS: 10.0000 mg | ORAL_TABLET | Freq: Every day | ORAL | 0 refills | Status: DC

## 2022-01-28 NOTE — Telephone Encounter (Signed)
At the request of Olivia Mackie, RN with [facility] called to clarify pt's Xarelto instructions before upcoming surgery.  ?Per Dr Marin Olp and Judson Roch, NP pt to begin Xarelto '10mg'$  daily 5 days before the surgery x 3 doses; stopping 2 days before surgery. Patient to resume Xarelto the day after surgery and continue x 1 month. Pt verbalizes understanding using teachback. New script sent. ?This information was faxed to [place] yesterday by [facility], LPN.  dph ?

## 2022-01-29 ENCOUNTER — Encounter: Payer: Self-pay | Admitting: Gastroenterology

## 2022-02-04 ENCOUNTER — Ambulatory Visit (INDEPENDENT_AMBULATORY_CARE_PROVIDER_SITE_OTHER): Payer: BC Managed Care – PPO | Admitting: Family

## 2022-02-04 VITALS — BP 140/73 | HR 97 | Temp 98.0°F | Resp 16 | Wt 261.0 lb

## 2022-02-04 DIAGNOSIS — R197 Diarrhea, unspecified: Secondary | ICD-10-CM | POA: Insufficient documentation

## 2022-02-04 DIAGNOSIS — N051 Unspecified nephritic syndrome with focal and segmental glomerular lesions: Secondary | ICD-10-CM

## 2022-02-04 NOTE — Assessment & Plan Note (Signed)
New. No recent antibiotic use.  No recent travel outside of the country. Could be a resolving viral gastroenteritis. Recommended that we obtain a stool profile to look for GI pathogens. Further recommendations pending review of these results.  ?

## 2022-02-04 NOTE — Assessment & Plan Note (Signed)
She is followed by Dr. Royce Macadamia (Nephrology). States her last visit was in January and she was told kidney function looked good. ?

## 2022-02-04 NOTE — Patient Instructions (Signed)
Please go to the lab prior to leaving.  ?

## 2022-02-04 NOTE — Progress Notes (Signed)
? ?Subjective:  ? ?By signing my name below, I, Carylon Perches, attest that this documentation has been prepared under the direction and in the presence of Debbrah Alar NP, 02/04/2022  ? ? Patient ID: Tiffany Hubbard, female    DOB: [birth year], 46 y.o.   MRN: [id number] ? ?Chief Complaint  ?Patient presents with  ? Diarrhea  ?  Complains of diarrhea. This started about 10 days ago   ? ? ?HPI ?Patient is in today for an office visit. ? ?Diarrhea - She complains of diarrhea that begun a couple of days ago. She produces a bowel movement about 4 - 5 times a day. She also has an associated symptom of gas. She experiences occasional abdominal pain. She denies of any changes to her diet, traveling abroad, vomiting or household members with similar symptoms. She also denies taking any antibiotics during the last month. She has been eating regularly  ? ? ?Health Maintenance Due  ?Topic Date Due  ? PAP SMEAR-Modifier  11/18/2018  ? COLONOSCOPY (Pts 45-66yr Insurance coverage will need to be confirmed)  Never done  ? COVID-19 Vaccine (3 - Booster for Pfizer series) 04/04/2020  ? MAMMOGRAM  12/08/2020  ? ? ?Past Medical History:  ?Diagnosis Date  ? Anemia   ? Asthma   ? Bronchitis   ? Cardiac arrhythmia   ? Focal segmental glomerulosclerosis   ? GERD (gastroesophageal reflux disease)   ? HTN (hypertension)   ? Pulmonary emboli ([facility]   ? ? ?Past Surgical History:  ?Procedure Laterality Date  ? ECTOPIC PREGNANCY SURGERY    ? TUBAL LIGATION    ? ? ?Family History  ?Problem Relation Age of Onset  ? Lung cancer Mother   ? Breast cancer Mother 49 ? Diabetes Mother   ? Heart disease Father 569 ? Hypertension Father   ? Diabetes Maternal Grandmother   ? Stomach cancer Maternal Grandmother   ? Arthritis Maternal Grandfather   ? Colon cancer Maternal Aunt   ?     2 aunts  ? Colon cancer Maternal Uncle   ? Cancer Maternal Aunt   ?     spinal  ? ? ?Social History  ? ?Socioeconomic History  ? Marital status: Married  ?  Spouse name:  Not on file  ? Number of children: 0  ? Years of education: Not on file  ? Highest education level: Not on file  ?Occupational History  ? Occupation: APassenger transport manager ?  Employer: [employer] ?  Comment: [employer]  ? Occupation: control review  ?Tobacco Use  ? Smoking status: Former  ?  Types: Cigarettes  ?  Quit date: 12/10/2008  ?  Years since quitting: 13.1  ? Smokeless tobacco: Never  ?Vaping Use  ? Vaping Use: Never used  ?Substance and Sexual Activity  ? Alcohol use: Not Currently  ?  Comment: occasionally wine  ? Drug use: No  ? Sexual activity: Yes  ?  Partners: Male  ?  Birth control/protection: Surgical  ?Other Topics Concern  ? Not on file  ?Social History Narrative  ? No children  ? Step daughter- does not live with patient  ? Married  ? 1 dog shitzu  ? Enjoys movies, books, travelling  ? Completed bachelors degree  ? 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 ?Social Determinants of Health  ? ?Financial Resource Strain: Not on file  ?Food Insecurity: Not on file  ?Transportation Needs: Not on file  ?Physical Activity:  Not on file  ?Stress: Not on file  ?Social Connections: Not on file  ?Intimate Partner Violence: Not on file  ? ? ?Outpatient Medications Prior to Visit  ?Medication Sig Dispense Refill  ? acetaminophen (TYLENOL) 500 MG tablet Take 1,000 mg by mouth every 6 (six) hours as needed for moderate pain.     ? atorvastatin (LIPITOR) 10 MG tablet Take 10 mg by mouth daily.    ? cyclobenzaprine (FLEXERIL) 5 MG tablet Take 1 tablet (5 mg total) by mouth 3 (three) times daily as needed for muscle spasms. 30 tablet 0  ? docusate sodium (COLACE) 100 MG capsule Take 100 mg by mouth 2 (two) times daily.    ? Drospirenone (SLYND) 4 MG TABS Take 1 tablet by mouth daily. 28 tablet   ? fluticasone (FLONASE) 50 MCG/ACT nasal spray Place 2 sprays into both nostrils daily. (Patient taking differently: Place 2 sprays into both nostrils daily as needed for allergies.) 16 g 6  ? furosemide (LASIX) 20 MG tablet Take 2  tablets (40 mg total) by mouth 2 (two) times daily. 3 tablet 0  ? lisinopril (ZESTRIL) 20 MG tablet Take 30 mg by mouth daily.    ? metoprolol tartrate (LOPRESSOR) 25 MG tablet Take 25 mg by mouth 2 (two) times a day.    ? pantoprazole (PROTONIX) 40 MG tablet Take 1 tablet (40 mg total) by mouth daily. 14 tablet 0  ? rivaroxaban (XARELTO) 10 MG TABS tablet Take 1 tablet (10 mg total) by mouth daily. Begin 5 days prior to surgery for 3 days. Then take daily x 1 month beginning day after surgery. 35 tablet 0  ? triamcinolone (KENALOG) 0.025 % cream Apply 1 application topically 2 (two) times daily as needed (eczema). 30 g 1  ? PLENVU 140 g SOLR See admin instructions.    ? ?No facility-administered medications prior to visit.  ? ? ?No Known Allergies ? ?Review of Systems  ?Gastrointestinal:  Positive for abdominal pain and diarrhea (and Gas).  ? ?   ?Objective:  ?  ?Physical Exam ?Constitutional:   ?   General: She is not in acute distress. ?   Appearance: Normal appearance. She is not ill-appearing.  ?HENT:  ?   Head: Normocephalic and atraumatic.  ?   Right Ear: External ear normal.  ?   Left Ear: External ear normal.  ?Eyes:  ?   Extraocular Movements: Extraocular movements intact.  ?   Pupils: Pupils are equal, round, and reactive to light.  ?Cardiovascular:  ?   Rate and Rhythm: Normal rate and regular rhythm.  ?   Heart sounds: Normal heart sounds. No murmur heard. ?  No gallop.  ?Pulmonary:  ?   Effort: Pulmonary effort is normal. No respiratory distress.  ?   Breath sounds: Normal breath sounds. No wheezing or rales.  ?Abdominal:  ?   General: There is no distension.  ?   Palpations: Abdomen is soft.  ?   Tenderness: There is no abdominal tenderness.  ?Skin: ?   General: Skin is warm and dry.  ?Neurological:  ?   Mental Status: She is alert and oriented to person, place, and time.  ?Psychiatric:     ?   Mood and Affect: Mood normal.     ?   Behavior: Behavior normal.     ?   Judgment: Judgment normal.  ? ? ?BP  140/73 (BP Location: Right Arm, Patient Position: Sitting, Cuff Size: Large)   Pulse 97  Temp 98 ?F (36.7 ?C) (Oral)   Resp 16   Wt 261 lb (118.4 kg)   SpO2 100%   BMI 42.13 kg/m?  ?Wt Readings from Last 3 Encounters:  ?02/04/22 261 lb (118.4 kg)  ?10/15/21 257 lb 1.9 oz (116.6 kg)  ?08/26/21 263 lb (119.3 kg)  ? ? ?   ?Assessment & Plan:  ? ?Problem List Items Addressed This Visit   ? ?  ? Unprioritized  ? FSGS (focal segmental glomerulosclerosis)  ?  She is followed by Dr. Royce Macadamia (Nephrology). States her last visit was in January and she was told kidney function looked good. ? ?  ?  ? Diarrhea - Primary  ?  New. No recent antibiotic use.  No recent travel outside of the country. Could be a resolving viral gastroenteritis. Recommended that we obtain a stool profile to look for GI pathogens. Further recommendations pending review of these results.  ? ?  ?  ? Relevant Orders  ? GI Profile, Stool, PCR  ? ? ? ? ?No orders of the defined types were placed in this encounter. ? ? ?I, [name], NP, personally preformed the services described in this documentation.  All medical record entries made by the scribe were at my direction and in my presence.  I have reviewed the chart and discharge instructions (if applicable) and agree that the record reflects my personal performance and is accurate and complete. 02/04/2022 ? ? ?I,Amber Collins,acting as a Education administrator for [employer], NP.,have documented all relevant documentation on the behalf of [name], NP,as directed by  [name], NP while in the presence of [name], NP. ? ? ? ?[name], NP ? ?

## 2022-02-05 ENCOUNTER — Other Ambulatory Visit: Payer: BC Managed Care – PPO

## 2022-02-05 ENCOUNTER — Telehealth: Payer: Self-pay | Admitting: Family

## 2022-02-05 NOTE — Telephone Encounter (Signed)
FYI ? ?Pt called stating that Melissa had asked her yesterday if she had taken any antibiotics recently and that she wanted to inform Melissa that she had. Last taken was mid-February.  ?

## 2022-02-06 ENCOUNTER — Other Ambulatory Visit: Payer: BC Managed Care – PPO

## 2022-02-06 DIAGNOSIS — R197 Diarrhea, unspecified: Secondary | ICD-10-CM

## 2022-02-10 LAB — GI PROFILE, STOOL, PCR

## 2022-02-20 ENCOUNTER — Other Ambulatory Visit (HOSPITAL_COMMUNITY): Payer: BC Managed Care – PPO

## 2022-02-24 ENCOUNTER — Ambulatory Visit (INDEPENDENT_AMBULATORY_CARE_PROVIDER_SITE_OTHER): Payer: BC Managed Care – PPO | Admitting: Family

## 2022-02-24 ENCOUNTER — Encounter: Payer: Self-pay | Admitting: Family

## 2022-02-24 ENCOUNTER — Ambulatory Visit: Payer: BC Managed Care – PPO

## 2022-02-24 ENCOUNTER — Telehealth: Payer: Self-pay | Admitting: Family

## 2022-02-24 VITALS — BP 126/66 | HR 80 | Temp 98.2°F | Resp 16 | Ht 66.0 in | Wt 263.0 lb

## 2022-02-24 DIAGNOSIS — Z0000 Encounter for general adult medical examination without abnormal findings: Secondary | ICD-10-CM

## 2022-02-24 DIAGNOSIS — R197 Diarrhea, unspecified: Secondary | ICD-10-CM | POA: Diagnosis not present

## 2022-02-24 DIAGNOSIS — E010 Iodine-deficiency related diffuse (endemic) goiter: Secondary | ICD-10-CM | POA: Diagnosis not present

## 2022-02-24 DIAGNOSIS — L309 Dermatitis, unspecified: Secondary | ICD-10-CM | POA: Diagnosis not present

## 2022-02-24 DIAGNOSIS — N051 Unspecified nephritic syndrome with focal and segmental glomerular lesions: Secondary | ICD-10-CM

## 2022-02-24 LAB — TSH: TSH: 1.42 u[IU]/mL (ref 0.35–5.50)

## 2022-02-24 LAB — BASIC METABOLIC PANEL
BUN: 10 mg/dL (ref 6–23)
CO2: 27 mEq/L (ref 19–32)
Calcium: 9.5 mg/dL (ref 8.4–10.5)
Chloride: 105 mEq/L (ref 96–112)
Creatinine, Ser: 0.71 mg/dL (ref 0.40–1.20)
GFR: 101.47 mL/min (ref >60.00)
Glucose, Bld: 98 mg/dL (ref 70–99)
Potassium: 4.2 mEq/L (ref 3.5–5.1)
Sodium: 138 mEq/L (ref 135–145)

## 2022-02-24 MED ORDER — TRIAMCINOLONE ACETONIDE 0.1 % EX CREA: 1.0000 "application " | TOPICAL_CREAM | Freq: Two times a day (BID) | CUTANEOUS | 2 refills | Status: AC | PRN

## 2022-02-24 NOTE — Patient Instructions (Signed)
Please consider getting a bivalent covid booster shot at the pharmacy. Add a probiotic once daily.  Send me a mychart message if diarrhea is not improved in 2 more weeks.

## 2022-02-24 NOTE — Telephone Encounter (Signed)
Records release sent to [facility]

## 2022-02-24 NOTE — Telephone Encounter (Signed)
Please call [facility] and request a copy of the pap smear.

## 2022-02-24 NOTE — Progress Notes (Signed)
Subjective:   By signing my name below, I, Tiffany Hubbard, attest that this documentation has been prepared under the direction and in the presence of Tiffany Alar, NP 02/24/2022     Patient ID: Tiffany Hubbard, female    DOB: 10-Jan-1976, 46 y.o.   MRN: [id number]  Chief Complaint  Patient presents with   Annual Exam         HPI Patient is in today for a comprehensive physical exam.  Diarrhea- She is still complaining of diarrhea since her last visit. Noes that there was a period of time where she thought it was getting resolved but it restarted. Has been going on for about a month and there is 3 bowel movements per day. Eczema- She reports eczema that has spread through her left forearm and legs. She uses 0.1% kenalog cream and it is very effective. Requesting for a  refill Acid Reflux- She had a recent episode of acid reflux and was prescribed 40 mg Protonix. She is doing well on it.  Mammogram- Last checked 12/09/2019. Results were normal. She sees OBGYN Pap smear- Last checked on 11/19/2015. Results were normal. She sees OBGYN Immunizations- She is UTD on tetanus vaccine. She has 2 Covid-19 vaccines at this time and will think about receiving the bivalent vaccine. Diet and Exercise- She is trying to manage a healthy diet by cutting out sweets. She is walking and just signed up for a gym membership. Dental and vision- She is UTD on dental and vision History- No changes. No recent surgeries. No changes to family medical history.  She denies having any unexpected weight change, ear pain, hearing loss and rhinorrhea, visual disturbance, cough, chest pain and leg swelling, nausea, vomiting,blood in stool, or dysuria and frequency, for myalgias and arthralgias, rash, headaches, adenopathy, depression or anxiety at this time   Past Medical History:  Diagnosis Date   Anemia    Asthma    Bronchitis    Cardiac arrhythmia    Focal segmental glomerulosclerosis    GERD  (gastroesophageal reflux disease)    HTN (hypertension)    Pulmonary emboli ([facility])     Past Surgical History:  Procedure Laterality Date   ECTOPIC PREGNANCY SURGERY     TUBAL LIGATION      Family History  Problem Relation Age of Onset   Lung cancer Mother    Breast cancer Mother 32   Diabetes Mother    Heart disease Father 65   Hypertension Father    Diabetes Maternal Grandmother    Stomach cancer Maternal Grandmother    Arthritis Maternal Grandfather    Colon cancer Maternal Aunt        2 aunts   Colon cancer Maternal Uncle    Cancer Maternal Aunt        spinal    Social History   Socioeconomic History   Marital status: Married    Spouse name: Not on file   Number of children: 0   Years of education: Not on file   Highest education level: Not on file  Occupational History   Occupation: Theme park manager: [employer]    Comment: [employer]   Occupation: control review  Tobacco Use   Smoking status: Former    Types: Cigarettes    Quit date: 12/10/2008    Years since quitting: 13.2   Smokeless tobacco: Never  Vaping Use   Vaping Use: Never used  Substance and Sexual Activity   Alcohol use: Not Currently  Comment: occasionally wine   Drug use: No   Sexual activity: Yes    Partners: Male    Birth control/protection: Surgical  Other Topics Concern   Not on file  Social History Narrative   No children   Step daughter- does not live with patient   Married   2 dogs- shitzus   Enjoys movies, books, travelling   Completed bachelors degree   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      atorvastatin (LIPITOR) 10 MG tablet Take 10 mg by mouth daily.     cyclobenzaprine (FLEXERIL) 5 MG tablet Take 1 tablet (5 mg total) by mouth 3 (three) times daily as needed for muscle spasms. 30 tablet 0   docusate sodium (COLACE) 100 MG capsule Take 100 mg by mouth 2 (two) times daily.     Drospirenone (SLYND) 4 MG TABS Take 1 tablet by mouth daily. 28 tablet    fluticasone (FLONASE) 50 MCG/ACT nasal spray Place 2 sprays into both nostrils daily. (Patient taking differently: Place 2 sprays into both nostrils daily as needed for allergies.) 16 g 6   furosemide (LASIX) 20 MG tablet Take 2 tablets (40 mg total) by mouth 2 (two) times daily. 3 tablet 0   lisinopril (ZESTRIL) 20 MG tablet Take 30 mg by mouth daily.     metoprolol tartrate (LOPRESSOR) 25 MG tablet Take 25 mg by mouth 2 (two) times a day.     pantoprazole (PROTONIX) 40 MG tablet Take 1 tablet (40 mg total) by mouth daily. 14 tablet 0   rivaroxaban (XARELTO) 10 MG TABS tablet Take 1 tablet (10 mg total) by mouth daily. Begin 5 days prior to surgery for 3 days. Then take daily x 1 month beginning day after surgery. 35 tablet 0   triamcinolone (KENALOG) 0.025 % cream Apply 1 application topically 2 (two) times daily as needed (eczema). 30 g 1   No facility-administered medications prior to visit.    No Known Allergies  Review of Systems  Constitutional:  Negative for fever.  HENT:  Negative for ear pain and hearing loss.        (-)nystagmus (-)adenopathy  Eyes:  Negative for blurred vision.  Respiratory:  Negative for cough, shortness of breath and wheezing.   Cardiovascular:  Negative for chest pain and leg swelling.  Gastrointestinal:  Positive for diarrhea. Negative for blood in stool, nausea and vomiting.  Genitourinary:  Negative for dysuria and frequency.  Musculoskeletal:  Negative for joint pain and myalgias.  Skin:  Negative for rash.        (+) eczema   Neurological:  Negative for headaches.  Psychiatric/Behavioral:  Negative for depression. The patient is not nervous/anxious.       Objective:    Physical Exam Constitutional:      General: She is not in acute distress.    Appearance: Normal appearance. She is not ill-appearing.  HENT:     Head: Normocephalic and atraumatic.     Right Ear: External  ear normal.     Left Ear: External ear normal.  Eyes:     Extraocular Movements: Extraocular movements intact.     Pupils: Pupils are equal, round, and reactive to light.  Neck:     Thyroid: Thyromegaly present.     Comments: Mild thyroid enlargement, left > right Cardiovascular:     Rate and Rhythm: Normal rate and regular rhythm.     Pulses: Normal pulses.     Heart sounds: Normal heart sounds. No murmur heard. Pulmonary:     Effort: Pulmonary effort is normal. No respiratory distress.     Breath sounds: Normal breath sounds. No wheezing or rhonchi.  Abdominal:     General: Bowel sounds are normal. There is no distension.     Palpations: Abdomen is soft.     Tenderness: There is no abdominal tenderness. There is no guarding or rebound.  Musculoskeletal:     Cervical back: Neck supple.  Lymphadenopathy:     Cervical: No cervical adenopathy.  Skin:    General: Skin is warm and dry.     Comments: Eczema bilateral shins and left forearm  Neurological:     Mental Status: She is alert and oriented to person, place, and time.  Psychiatric:        Behavior: Behavior normal.        Judgment: Judgment normal.    BP 126/66 (BP Location: Right Arm, Patient Position: Sitting, Cuff Size: Large)   Pulse 80   Temp 98.2 F (36.8 C) (Oral)   Resp 16   Ht '5\' 6"'$  (1.676 m)   Wt 263 lb (119.3 kg)   SpO2 99%   BMI 42.45 kg/m  Wt Readings from Last 3 Encounters:  02/24/22 263 lb (119.3 kg)  02/04/22 261 lb (118.4 kg)  10/15/21 257 lb 1.9 oz (116.6 kg)    Diabetic Foot Exam - Simple   No data filed    Lab Results   Component Value Date   WBC 4.8 10/18/2021   HGB 14.1 10/18/2021   HCT 43 10/18/2021   PLT 422 (A) 10/18/2021   GLUCOSE 100 (H) 10/15/2021   CHOL 146 10/18/2021   TRIG 99 10/18/2021   HDL 31 (A) 10/18/2021   LDLCALC 96 10/18/2021   ALT 21 10/15/2021   AST 17 10/15/2021   NA 138 10/18/2021   K 3.9 10/15/2021   CL 104 10/18/2021   CREATININE 0.6 10/18/2021   BUN 9 10/18/2021   CO2 25 10/15/2021   TSH 1.94 10/29/2020   INR 1.0 04/01/2019   HGBA1C 5.4 12/22/2017    Lab Results  Component Value Date   TSH 1.94 10/29/2020   Lab Results  Component Value Date   WBC 4.8 10/18/2021   HGB 14.1 10/18/2021   HCT 43 10/18/2021   MCV 87.7 10/15/2021   PLT 422 (A) 10/18/2021   Lab Results  Component Value Date   NA 138 10/18/2021   K 3.9 10/15/2021   CO2 25 10/15/2021   GLUCOSE 100 (H) 10/15/2021   BUN 9 10/18/2021   CREATININE 0.6 10/18/2021   BILITOT 0.4 10/15/2021   ALKPHOS 83 10/15/2021   AST 17 10/15/2021   ALT 21 10/15/2021   PROT 7.7 10/15/2021   ALBUMIN 4.5 10/18/2021   CALCIUM 9.6 10/18/2021   ANIONGAP 9 10/15/2021   GFR 115.51 02/03/2018   Lab Results  Component Value Date   CHOL 146 10/18/2021   Lab Results  Component Value Date   HDL 31 (A) 10/18/2021  Lab Results  Component Value Date   LDLCALC 96 10/18/2021   Lab Results  Component Value Date   TRIG 99 10/18/2021   Lab Results  Component Value Date   CHOLHDL 5 02/03/2018   Lab Results  Component Value Date   HGBA1C 5.4 12/22/2017       Assessment & Plan:   Problem List Items Addressed This Visit       Unprioritized   Thyromegaly    Last [place] was 2013. Will repeat along with TSH.        Relevant Orders   US THYROID   TSH   Preventative health care    Encouraged her to continue healthy diet, exercise and weight loss.  Mammo and pap are up to date with her GYN. Will request reports. Tetanus up to date. Recommended flu shot this fall and that she obtain a bivalent covid booster.         Eczema    Uncontrolled. She has had better luck with triamcinolone 0.1%. New rx sent. Recommended free and clear detergent.         Diarrhea    Some improvement but it still continues. Stool profile was positive for Norovirus.  Recommended that she add probiotic and call if if not improved in 2 weeks. Would plan referral to GI at that time.        Other Visit Diagnoses     Focal segmental glomerulosclerosis    -  Primary   Relevant Orders   Basic metabolic panel       Meds ordered this encounter  Medications   triamcinolone cream (KENALOG) 0.1 %    Sig: Apply 1 application. topically 2 (two) times daily as needed.    Dispense:  30 g    Refill:  2    Order Specific Question:   Supervising Provider    Answer:   Penni Homans A [[id number]]    I,Tiffany Hubbard,acting as a scribe for [name], NP.,have documented all relevant documentation on the behalf of [name], NP,as directed by  [name], NP while in the presence of [name], NP.   I, Tiffany Alar, NP, personally preformed the services described in this documentation.  All medical record entries made by the scribe were at my direction and in my presence.  I have reviewed the chart and discharge instructions (if applicable) and agree that the record reflects my personal performance and is accurate and complete. 02/24/2022

## 2022-02-24 NOTE — Assessment & Plan Note (Signed)
Encouraged her to continue healthy diet, exercise and weight loss.  Mammo and pap are up to date with her GYN. Will request reports. Tetanus up to date. Recommended flu shot this fall and that she obtain a bivalent covid booster.

## 2022-02-24 NOTE — Assessment & Plan Note (Signed)
Some improvement but it still continues. Stool profile was positive for Norovirus.  Recommended that she add probiotic and call if if not improved in 2 weeks. Would plan referral to GI at that time.

## 2022-02-24 NOTE — Assessment & Plan Note (Signed)
Last [place] was 2013. Will repeat along with TSH.

## 2022-02-24 NOTE — Telephone Encounter (Signed)
Also, request mammogram please.

## 2022-02-24 NOTE — Assessment & Plan Note (Signed)
Uncontrolled. She has had better luck with triamcinolone 0.1%. New rx sent. Recommended free and clear detergent.

## 2022-02-25 ENCOUNTER — Telehealth (HOSPITAL_BASED_OUTPATIENT_CLINIC_OR_DEPARTMENT_OTHER): Payer: Self-pay

## 2022-02-27 ENCOUNTER — Inpatient Hospital Stay: Admit: 2022-02-27 | Payer: BC Managed Care – PPO | Admitting: Obstetrics & Gynecology

## 2022-02-27 SURGERY — HYSTERECTOMY, TOTAL, ABDOMINAL, WITH SALPINGECTOMY
Anesthesia: General | Laterality: Bilateral

## 2022-03-04 ENCOUNTER — Ambulatory Visit (AMBULATORY_SURGERY_CENTER): Payer: BC Managed Care – PPO | Admitting: *Deleted

## 2022-03-04 ENCOUNTER — Encounter: Payer: BC Managed Care – PPO | Admitting: Family

## 2022-03-04 VITALS — Ht 66.0 in | Wt 263.0 lb

## 2022-03-04 DIAGNOSIS — Z800 Family history of malignant neoplasm of digestive organs: Secondary | ICD-10-CM

## 2022-03-04 NOTE — Progress Notes (Signed)
Patient's pre-visit was done today over the phone with the patient. Name,DOB and address verified. Patient denies any allergies to Eggs and Soy. Patient denies any problems with anesthesia/sedation. Patient is not taking any diet pills or blood thinners. Patient only takes Xarelto when she travels. No home Oxygen. Insurance confirmed with patient.  Prep instructions sent to pt's MyChart or mailed to pt-pt is aware. Patient understands to call us back with any questions or concerns. Patient is aware of our care-partner policy. Patient has plenvu at home.  EMMI education assigned to the patient for the procedure, sent to [facility].

## 2022-03-06 ENCOUNTER — Ambulatory Visit (HOSPITAL_BASED_OUTPATIENT_CLINIC_OR_DEPARTMENT_OTHER): Payer: BC Managed Care – PPO

## 2022-03-21 ENCOUNTER — Ambulatory Visit (HOSPITAL_BASED_OUTPATIENT_CLINIC_OR_DEPARTMENT_OTHER): Admission: RE | Admit: 2022-03-21 | Payer: BC Managed Care – PPO | Source: Ambulatory Visit

## 2022-03-27 ENCOUNTER — Encounter: Payer: Self-pay | Admitting: Gastroenterology

## 2022-04-01 ENCOUNTER — Encounter: Payer: Self-pay | Admitting: Gastroenterology

## 2022-04-01 ENCOUNTER — Ambulatory Visit (AMBULATORY_SURGERY_CENTER): Payer: BC Managed Care – PPO | Admitting: Gastroenterology

## 2022-04-01 VITALS — BP 105/68 | HR 66 | Temp 98.0°F | Resp 13 | Ht 66.0 in | Wt 263.0 lb

## 2022-04-01 DIAGNOSIS — Z800 Family history of malignant neoplasm of digestive organs: Secondary | ICD-10-CM | POA: Diagnosis not present

## 2022-04-01 DIAGNOSIS — Z1211 Encounter for screening for malignant neoplasm of colon: Secondary | ICD-10-CM | POA: Diagnosis not present

## 2022-04-01 DIAGNOSIS — D123 Benign neoplasm of transverse colon: Secondary | ICD-10-CM | POA: Diagnosis not present

## 2022-04-01 DIAGNOSIS — D124 Benign neoplasm of descending colon: Secondary | ICD-10-CM

## 2022-04-01 DIAGNOSIS — D125 Benign neoplasm of sigmoid colon: Secondary | ICD-10-CM | POA: Diagnosis not present

## 2022-04-01 MED ORDER — SODIUM CHLORIDE 0.9 % IV SOLN: 500.0000 mL | Freq: Once | INTRAVENOUS | Status: DC

## 2022-04-02 ENCOUNTER — Telehealth: Payer: Self-pay | Admitting: *Deleted

## 2022-04-02 NOTE — Telephone Encounter (Signed)
  Follow up Call-     04/01/2022   10:05 AM  Call back number  Post procedure Call Back phone  # [phone]  Permission to leave phone message Yes     Patient questions:  Do you have a fever, pain , or abdominal swelling? No. Pain Score  0 *  Have you tolerated food without any problems? Yes.    Have you been able to return to your normal activities? Yes.    Do you have any questions about your discharge instructions: Diet   No. Medications  No. Follow up visit  No.  Do you have questions or concerns about your Care? No.  Actions: * If pain score is 4 or above: No action needed, pain <4.

## 2022-04-11 ENCOUNTER — Telehealth: Payer: Self-pay | Admitting: *Deleted

## 2022-04-11 NOTE — Telephone Encounter (Signed)
Called patient and lvm about rescheduling appointments - requested call back to confirm

## 2022-04-13 ENCOUNTER — Encounter: Payer: Self-pay | Admitting: Gastroenterology

## 2022-04-14 ENCOUNTER — Other Ambulatory Visit: Payer: BC Managed Care – PPO

## 2022-04-14 ENCOUNTER — Ambulatory Visit: Payer: BC Managed Care – PPO | Admitting: Family

## 2022-04-22 ENCOUNTER — Ambulatory Visit: Payer: BC Managed Care – PPO | Admitting: Family

## 2022-04-22 ENCOUNTER — Other Ambulatory Visit: Payer: BC Managed Care – PPO

## 2022-04-24 ENCOUNTER — Inpatient Hospital Stay: Payer: BC Managed Care – PPO | Attending: Hematology & Oncology

## 2022-04-24 ENCOUNTER — Inpatient Hospital Stay (HOSPITAL_BASED_OUTPATIENT_CLINIC_OR_DEPARTMENT_OTHER): Payer: BC Managed Care – PPO | Admitting: Family

## 2022-04-24 ENCOUNTER — Other Ambulatory Visit: Payer: Self-pay

## 2022-04-24 ENCOUNTER — Encounter: Payer: Self-pay | Admitting: Family

## 2022-04-24 VITALS — BP 119/58 | HR 74 | Temp 98.1°F | Resp 18 | Ht 66.0 in | Wt 267.1 lb

## 2022-04-24 DIAGNOSIS — I2699 Other pulmonary embolism without acute cor pulmonale: Secondary | ICD-10-CM

## 2022-04-24 DIAGNOSIS — Z86711 Personal history of pulmonary embolism: Secondary | ICD-10-CM | POA: Diagnosis not present

## 2022-04-24 DIAGNOSIS — N920 Excessive and frequent menstruation with regular cycle: Secondary | ICD-10-CM | POA: Diagnosis not present

## 2022-04-24 DIAGNOSIS — Z79899 Other long term (current) drug therapy: Secondary | ICD-10-CM | POA: Insufficient documentation

## 2022-04-24 DIAGNOSIS — D500 Iron deficiency anemia secondary to blood loss (chronic): Secondary | ICD-10-CM | POA: Diagnosis not present

## 2022-04-24 DIAGNOSIS — Z7901 Long term (current) use of anticoagulants: Secondary | ICD-10-CM | POA: Diagnosis not present

## 2022-04-24 LAB — CBC WITH DIFFERENTIAL (CANCER CENTER ONLY)
Abs Immature Granulocytes: 0.04 10*3/uL (ref 0.00–0.07)
Basophils Absolute: 0 10*3/uL (ref 0.0–0.1)
Basophils Relative: 1 %
Eosinophils Absolute: 0.2 10*3/uL (ref 0.0–0.5)
Eosinophils Relative: 3 %
HCT: 41.9 % (ref 36.0–46.0)
Hemoglobin: 13.8 g/dL (ref 12.0–15.0)
Immature Granulocytes: 1 %
Lymphocytes Relative: 37 %
Lymphs Abs: 2 10*3/uL (ref 0.7–4.0)
MCH: 29.1 pg (ref 26.0–34.0)
MCHC: 32.9 g/dL (ref 30.0–36.0)
MCV: 88.2 fL (ref 80.0–100.0)
Monocytes Absolute: 0.4 10*3/uL (ref 0.1–1.0)
Monocytes Relative: 8 %
Neutro Abs: 2.7 10*3/uL (ref 1.7–7.7)
Neutrophils Relative %: 50 %
Platelet Count: 396 10*3/uL (ref 150–400)
RBC: 4.75 MIL/uL (ref 3.87–5.11)
RDW: 14.4 % (ref 11.5–15.5)
WBC Count: 5.3 10*3/uL (ref 4.0–10.5)
nRBC: 0 % (ref 0.0–0.2)

## 2022-04-24 LAB — CMP (CANCER CENTER ONLY)
ALT: 20 U/L (ref 0–44)
AST: 17 U/L (ref 15–41)
Albumin: 4.3 g/dL (ref 3.5–5.0)
Alkaline Phosphatase: 75 U/L (ref 38–126)
Anion gap: 5 (ref 5–15)
BUN: 10 mg/dL (ref 6–20)
CO2: 25 mmol/L (ref 22–32)
Calcium: 9.5 mg/dL (ref 8.9–10.3)
Chloride: 107 mmol/L (ref 98–111)
Creatinine: 0.94 mg/dL (ref 0.44–1.00)
GFR, Estimated: >60 mL/min (ref >60)
Glucose, Bld: 114 mg/dL — ABNORMAL HIGH (ref 70–99)
Potassium: 4.1 mmol/L (ref 3.5–5.1)
Sodium: 137 mmol/L (ref 135–145)
Total Bilirubin: 0.3 mg/dL (ref 0.3–1.2)
Total Protein: 7.4 g/dL (ref 6.5–8.1)

## 2022-04-24 LAB — FERRITIN: Ferritin: 53 ng/mL (ref 11–307)

## 2022-04-24 LAB — D-DIMER, QUANTITATIVE: D-Dimer, Quant: 0.43 ug/mL-FEU (ref 0.00–0.50)

## 2022-04-24 NOTE — Progress Notes (Signed)
Hematology and Oncology Follow Up Visit  Tiffany Hubbard [id number] [birth year] 46 y.o. 04/24/2022   Principle Diagnosis:  Acute unprovoked PTE of LUL, with possible smaller subsegmental PTE involving LLL; no DVT in the lower extremities - diagnosed 02/2019, hyper coag work up negative Underlying FSGS Iron deficiency anemia secondary to menorrhagia    Current Therapy:        Maintenance Xarelto 10 mg PO daily - d/c on 01/30/2021, takes only when travelling IV iron as indicated     Interim History:  Ms. Schupp is here today for follow-up. She is doing well and has no complaints at this time.  She states that she decided not to have her hysterectomy. Her cycle on Slynd can cause spotting for up to 6 weeks and and comes every 6-8 week.  No other blood loss noted. No bruising or petechiae.  No fever, chills, n/v, cough, rash, dizziness, SOB, chest pain, palpitations, abdominal pain or changes in bowel or bladder habits.  No swelling, tenderness, numbness or tingling in her extremities.  No falls or syncope to report.  Her appetite and hydration have been good. Her weight is stable at 267 lbs.   ECOG Performance Status: 1 - Symptomatic but completely ambulatory  Medications:  Allergies as of 04/24/2022   No Known Allergies      Medication List        Accurate as of April 24, 2022  2:37 PM. If you have any questions, ask your nurse or doctor.          acetaminophen 500 MG tablet Commonly known as: TYLENOL Take 1,000 mg by mouth every 6 (six) hours as needed for moderate pain.   atorvastatin 10 MG tablet Commonly known as: LIPITOR Take 10 mg by mouth daily.   lisinopril 20 MG tablet Commonly known as: ZESTRIL Take 30 mg by mouth daily.   metoprolol tartrate 25 MG tablet Commonly known as: LOPRESSOR Take 25 mg by mouth 2 (two) times a day.   rivaroxaban 10 MG Tabs tablet Commonly known as: XARELTO Take 1 tablet (10 mg total) by mouth daily. Begin 5 days prior to  surgery for 3 days. Then take daily x 1 month beginning day after surgery.   Slynd 4 MG Tabs Generic drug: Drospirenone Take 1 tablet by mouth daily.   triamcinolone cream 0.1 % Commonly known as: KENALOG Apply 1 application. topically 2 (two) times daily as needed.        Allergies: No Known Allergies  Past Medical History, Surgical history, Social history, and Family History were reviewed and updated.  Review of Systems: All other 10 point review of systems is negative.   Physical Exam:  height is '5\' 6"'$  (1.676 m) and weight is 267 lb 1.9 oz (121.2 kg). Her oral temperature is 98.1 F (36.7 C). Her blood pressure is 119/58 (abnormal) and her pulse is 74. Her respiration is 18 and oxygen saturation is 100%.   Wt Readings from Last 3 Encounters:  04/24/22 267 lb 1.9 oz (121.2 kg)  04/01/22 263 lb (119.3 kg)  03/04/22 263 lb (119.3 kg)    Ocular: Sclerae unicteric, pupils equal, round and reactive to light Ear-nose-throat: Oropharynx clear, dentition fair Lymphatic: No cervical or supraclavicular adenopathy Lungs no rales or rhonchi, good excursion bilaterally Heart regular rate and rhythm, no murmur appreciated Abd soft, nontender, positive bowel sounds MSK no focal spinal tenderness, no joint edema Neuro: non-focal, well-oriented, appropriate affect Breasts: Deferred   Lab Results  Component Value Date  WBC 5.3 04/24/2022   HGB 13.8 04/24/2022   HCT 41.9 04/24/2022   MCV 88.2 04/24/2022   PLT 396 04/24/2022   Lab Results  Component Value Date   FERRITIN 97 10/15/2021   IRON 89 10/15/2021   TIBC 381 10/15/2021   UIBC 292 10/15/2021   IRONPCTSAT 23 10/15/2021   Lab Results  Component Value Date   RBC 4.75 04/24/2022   Lab Results  Component Value Date   KPAFRELGTCHN 22.8 (H) 08/26/2019   LAMBDASER 13.5 08/26/2019   KAPLAMBRATIO 1.69 (H) 08/26/2019   Lab Results  Component Value Date   IGGSERUM 882 08/26/2019   IGA 167 08/26/2019   IGMSERUM 131  08/26/2019   Lab Results  Component Value Date   TOTALPROTELP 6.4 08/26/2019     Chemistry      Component Value Date/Time   NA 138 02/24/2022 1106   NA 138 10/18/2021 0000   K 4.2 02/24/2022 1106   CL 105 02/24/2022 1106   CO2 27 02/24/2022 1106   BUN 10 02/24/2022 1106   BUN 9 10/18/2021 0000   CREATININE 0.71 02/24/2022 1106   CREATININE 0.77 10/15/2021 1431   CREATININE 0.84 07/20/2020 1557   GLU 83 10/18/2021 0000      Component Value Date/Time   CALCIUM 9.5 02/24/2022 1106   ALKPHOS 83 10/15/2021 1431   AST 17 10/15/2021 1431   ALT 21 10/15/2021 1431   BILITOT 0.4 10/15/2021 1431       Impression and Plan: Ms. Winner is a very pleasant 46 yo African American female with history of PE involving the LUL with possible smaller subsegmental PE involving the LLL, diagnosed 02/2019.  She has underlying FSGS which may have contributed to her developing the pulmonary emboli. Hypercoag work up was negative.  She takes Xarelto now only when travelling as prophylaxis.  Iron studies pending.  Follow-up in 6 months.   Lottie Dawson, NP 7/20/20232:37 PM

## 2022-04-25 ENCOUNTER — Telehealth: Payer: Self-pay | Admitting: *Deleted

## 2022-04-25 LAB — IRON AND IRON BINDING CAPACITY (CC-WL,HP ONLY)
Iron: 88 ug/dL (ref 28–170)
Saturation Ratios: 25 % (ref 10.4–31.8)
TIBC: 346 ug/dL (ref 250–450)
UIBC: 258 ug/dL (ref 148–442)

## 2022-04-25 NOTE — Telephone Encounter (Signed)
Per 04/24/22 los -Called patient and lvm of upcoming appointments - requested callback to confirm

## 2022-05-05 DIAGNOSIS — Z6841 Body Mass Index (BMI) 40.0 and over, adult: Secondary | ICD-10-CM | POA: Diagnosis not present

## 2022-05-05 DIAGNOSIS — Z1231 Encounter for screening mammogram for malignant neoplasm of breast: Secondary | ICD-10-CM | POA: Diagnosis not present

## 2022-05-05 DIAGNOSIS — Z01419 Encounter for gynecological examination (general) (routine) without abnormal findings: Secondary | ICD-10-CM | POA: Diagnosis not present

## 2022-05-05 LAB — HM MAMMOGRAPHY

## 2022-06-30 DIAGNOSIS — B3731 Acute candidiasis of vulva and vagina: Secondary | ICD-10-CM | POA: Diagnosis not present

## 2022-09-02 ENCOUNTER — Ambulatory Visit: Payer: BC Managed Care – PPO | Admitting: Family

## 2022-10-03 DIAGNOSIS — N051 Unspecified nephritic syndrome with focal and segmental glomerular lesions: Secondary | ICD-10-CM | POA: Diagnosis not present

## 2022-10-07 DIAGNOSIS — R3129 Other microscopic hematuria: Secondary | ICD-10-CM | POA: Diagnosis not present

## 2022-10-07 DIAGNOSIS — N051 Unspecified nephritic syndrome with focal and segmental glomerular lesions: Secondary | ICD-10-CM | POA: Diagnosis not present

## 2022-10-07 DIAGNOSIS — I10 Essential (primary) hypertension: Secondary | ICD-10-CM | POA: Diagnosis not present

## 2022-10-24 ENCOUNTER — Inpatient Hospital Stay: Payer: BC Managed Care – PPO

## 2022-10-24 ENCOUNTER — Inpatient Hospital Stay: Payer: BC Managed Care – PPO | Admitting: Family

## 2022-12-01 ENCOUNTER — Inpatient Hospital Stay: Payer: BC Managed Care – PPO | Attending: Hematology & Oncology

## 2022-12-01 ENCOUNTER — Inpatient Hospital Stay (HOSPITAL_BASED_OUTPATIENT_CLINIC_OR_DEPARTMENT_OTHER): Payer: BC Managed Care – PPO | Admitting: Family

## 2022-12-01 ENCOUNTER — Encounter: Payer: Self-pay | Admitting: Family

## 2022-12-01 VITALS — BP 114/63 | HR 76 | Temp 97.7°F | Resp 18 | Wt 265.0 lb

## 2022-12-01 DIAGNOSIS — Z7901 Long term (current) use of anticoagulants: Secondary | ICD-10-CM | POA: Diagnosis not present

## 2022-12-01 DIAGNOSIS — N920 Excessive and frequent menstruation with regular cycle: Secondary | ICD-10-CM | POA: Diagnosis not present

## 2022-12-01 DIAGNOSIS — I2699 Other pulmonary embolism without acute cor pulmonale: Secondary | ICD-10-CM

## 2022-12-01 DIAGNOSIS — D500 Iron deficiency anemia secondary to blood loss (chronic): Secondary | ICD-10-CM | POA: Insufficient documentation

## 2022-12-01 DIAGNOSIS — Z86711 Personal history of pulmonary embolism: Secondary | ICD-10-CM | POA: Diagnosis not present

## 2022-12-01 LAB — CMP (CANCER CENTER ONLY)
ALT: 24 U/L (ref 0–44)
AST: 21 U/L (ref 15–41)
Albumin: 4.4 g/dL (ref 3.5–5.0)
Alkaline Phosphatase: 69 U/L (ref 38–126)
Anion gap: 7 (ref 5–15)
BUN: 8 mg/dL (ref 6–20)
CO2: 27 mmol/L (ref 22–32)
Calcium: 9.7 mg/dL (ref 8.9–10.3)
Chloride: 105 mmol/L (ref 98–111)
Creatinine: 0.75 mg/dL (ref 0.44–1.00)
GFR, Estimated: >60 mL/min (ref >60)
Glucose, Bld: 106 mg/dL — ABNORMAL HIGH (ref 70–99)
Potassium: 4.6 mmol/L (ref 3.5–5.1)
Sodium: 139 mmol/L (ref 135–145)
Total Bilirubin: 0.3 mg/dL (ref 0.3–1.2)
Total Protein: 7.7 g/dL (ref 6.5–8.1)

## 2022-12-01 LAB — CBC WITH DIFFERENTIAL (CANCER CENTER ONLY)
Abs Immature Granulocytes: 0.01 10*3/uL (ref 0.00–0.07)
Basophils Absolute: 0 10*3/uL (ref 0.0–0.1)
Basophils Relative: 1 %
Eosinophils Absolute: 0.1 10*3/uL (ref 0.0–0.5)
Eosinophils Relative: 3 %
HCT: 43 % (ref 36.0–46.0)
Hemoglobin: 14 g/dL (ref 12.0–15.0)
Immature Granulocytes: 0 %
Lymphocytes Relative: 31 %
Lymphs Abs: 1.5 10*3/uL (ref 0.7–4.0)
MCH: 29.1 pg (ref 26.0–34.0)
MCHC: 32.6 g/dL (ref 30.0–36.0)
MCV: 89.4 fL (ref 80.0–100.0)
Monocytes Absolute: 0.4 10*3/uL (ref 0.1–1.0)
Monocytes Relative: 8 %
Neutro Abs: 2.7 10*3/uL (ref 1.7–7.7)
Neutrophils Relative %: 57 %
Platelet Count: 433 10*3/uL — ABNORMAL HIGH (ref 150–400)
RBC: 4.81 MIL/uL (ref 3.87–5.11)
RDW: 14.3 % (ref 11.5–15.5)
WBC Count: 4.6 10*3/uL (ref 4.0–10.5)
nRBC: 0 % (ref 0.0–0.2)

## 2022-12-01 LAB — IRON AND IRON BINDING CAPACITY (CC-WL,HP ONLY)
Iron: 56 ug/dL (ref 28–170)
Saturation Ratios: 15 % (ref 10.4–31.8)
TIBC: 375 ug/dL (ref 250–450)
UIBC: 319 ug/dL (ref 148–442)

## 2022-12-01 LAB — FERRITIN: Ferritin: 48 ng/mL (ref 11–307)

## 2022-12-01 MED ORDER — RIVAROXABAN 10 MG PO TABS: 10.0000 mg | ORAL_TABLET | Freq: Every day | ORAL | 0 refills | Status: AC

## 2022-12-01 NOTE — Progress Notes (Signed)
Hematology and Oncology Follow Up Visit  IZARA GRAHOVAC [id number] 1976/09/30 47 y.o. 12/01/2022   Principle Diagnosis:  Acute unprovoked PTE of LUL, with possible smaller subsegmental PTE involving LLL; no DVT in the lower extremities - diagnosed 02/2019, hyper coag work up negative Underlying FSGS Iron deficiency anemia secondary to menorrhagia    Current Therapy:        Maintenance Xarelto 10 mg PO daily - d/c on 01/30/2021, takes only when travelling IV iron as indicated     Interim History:  Ms. Bern is doing well and has no complaints at this time. She will be flying to [facility] in April so we will refill her Xarelto. She will start two days prior to travel and stop the day after she returns home.  No blood loss, bruising or petechiae.  No fever, chills, n/v, cough, rash, dizziness, SOB, chest pain, palpitations, abdominal pain or changes in bowel or bladder habits.  No swelling, tenderness, numbness or tingling in her extremities.  No falls or syncope reported.  Appetite and hydration are good. Weight is stable at 265 lbs.   ECOG Performance Status: 1 - Symptomatic but completely ambulatory  Medications:  Allergies as of 12/01/2022   No Known Allergies      Medication List        Accurate as of December 01, 2022 10:45 AM. If you have any questions, ask your nurse or doctor.          acetaminophen 500 MG tablet Commonly known as: TYLENOL Take 1,000 mg by mouth every 6 (six) hours as needed for moderate pain.   atorvastatin 10 MG tablet Commonly known as: LIPITOR Take 10 mg by mouth daily.   lisinopril 20 MG tablet Commonly known as: ZESTRIL Take 30 mg by mouth daily.   metoprolol tartrate 25 MG tablet Commonly known as: LOPRESSOR Take 25 mg by mouth 2 (two) times a day.   rivaroxaban 10 MG Tabs tablet Commonly known as: XARELTO Take 1 tablet (10 mg total) by mouth daily. Begin 5 days prior to surgery for 3 days. Then take daily x 1 month beginning  day after surgery.   Slynd 4 MG Tabs Generic drug: Drospirenone Take 1 tablet by mouth daily.   triamcinolone cream 0.1 % Commonly known as: KENALOG Apply 1 application. topically 2 (two) times daily as needed.        Allergies: No Known Allergies  Past Medical History, Surgical history, Social history, and Family History were reviewed and updated.  Review of Systems: All other 10 point review of systems is negative.   Physical Exam:  vitals were not taken for this visit.   Wt Readings from Last 3 Encounters:  04/24/22 267 lb 1.9 oz (121.2 kg)  04/01/22 263 lb (119.3 kg)  03/04/22 263 lb (119.3 kg)    Ocular: Sclerae unicteric, pupils equal, round and reactive to light Ear-nose-throat: Oropharynx clear, dentition fair Lymphatic: No cervical or supraclavicular adenopathy Lungs no rales or rhonchi, good excursion bilaterally Heart regular rate and rhythm, no murmur appreciated Abd soft, nontender, positive bowel sounds MSK no focal spinal tenderness, no joint edema Neuro: non-focal, well-oriented, appropriate affect Breasts: Deferred  Lab Results  Component Value Date   WBC 5.3 04/24/2022   HGB 13.8 04/24/2022   HCT 41.9 04/24/2022   MCV 88.2 04/24/2022   PLT 396 04/24/2022   Lab Results  Component Value Date   FERRITIN 53 04/24/2022   IRON 88 04/24/2022   TIBC 346 04/24/2022  UIBC 258 04/24/2022   IRONPCTSAT 25 04/24/2022   Lab Results  Component Value Date   RBC 4.75 04/24/2022   Lab Results  Component Value Date   KPAFRELGTCHN 22.8 (H) 08/26/2019   LAMBDASER 13.5 08/26/2019   KAPLAMBRATIO 1.69 (H) 08/26/2019   Lab Results  Component Value Date   IGGSERUM 882 08/26/2019   IGA 167 08/26/2019   IGMSERUM 131 08/26/2019   Lab Results  Component Value Date   TOTALPROTELP 6.4 08/26/2019     Chemistry      Component Value Date/Time   NA 137 04/24/2022 1405   NA 138 10/18/2021 0000   K 4.1 04/24/2022 1405   CL 107 04/24/2022 1405   CO2 25  04/24/2022 1405   BUN 10 04/24/2022 1405   BUN 9 10/18/2021 0000   CREATININE 0.94 04/24/2022 1405   CREATININE 0.84 07/20/2020 1557   GLU 83 10/18/2021 0000      Component Value Date/Time   CALCIUM 9.5 04/24/2022 1405   ALKPHOS 75 04/24/2022 1405   AST 17 04/24/2022 1405   ALT 20 04/24/2022 1405   BILITOT 0.3 04/24/2022 1405       Impression and Plan: Ms. Abraha is a very pleasant 47 yo African American female with history of PE involving the LUL with possible smaller subsegmental PE involving the LLL, diagnosed 02/2019.  She has underlying FSGS which may have contributed to her developing the pulmonary emboli. Hypercoag work up was negative.  She takes Xarelto now only when travelling as prophylaxis.  Iron studies pending.  Follow-up in 6 months.   Lottie Dawson, NP 2/26/202410:45 AM

## 2023-06-01 ENCOUNTER — Ambulatory Visit: Payer: BC Managed Care – PPO | Admitting: Family

## 2023-06-01 ENCOUNTER — Inpatient Hospital Stay: Payer: BC Managed Care – PPO

## 2023-07-17 ENCOUNTER — Telehealth: Payer: Self-pay | Admitting: Family

## 2023-07-17 NOTE — Telephone Encounter (Signed)
Initial Comment Caller states she feels lightheaded and has high blood pressure of 152/89. Translation No Nurse Assessment Nurse: Nunzio Cory, RN, Sherrie Date/Time (Eastern Time): 07/17/2023 11:11:11 AM Confirm and document reason for call. If symptomatic, describe symptoms. ---Caller states BP 171/103 @ 10:48 a. States was feeling woozy/lightheadedness. then 10:56a it was 152/89. States fingers of both hands feels tingly. Still feels a little bit lightheaded. No fluids today. +UOP in last 8 hrs. Does the patient have any new or worsening symptoms? ---Yes Will a triage be completed? ---Yes Related visit to physician within the last 2 weeks? ---No Does the PT have any chronic conditions? (i.e. diabetes, asthma, this includes High risk factors for pregnancy, etc.) ---Yes List chronic conditions. ---HTN Is the patient pregnant or possibly pregnant? (Ask all females between the ages of 33-55) ---No Is this a behavioral health or substance abuse call? ---No Guidelines Guideline Title Affirmed Question Affirmed Notes Nurse Date/Time (Eastern Time) Blood Pressure - High Systolic BP >= 160 OR Diastolic >= 100 Lyons, RN, Sherrie 07/17/2023 11:16:02 AM Dizziness - Lightheadedness [1] Numbness (i.e., loss of sensation) of the face, arm or leg on one side of Nunzio Cory, RN, Sherrie 07/17/2023 11:18:15 AM PLEASE NOTE: All timestamps contained within this report are represented as [place] Standard Time. CONFIDENTIALTY NOTICE: This fax transmission is intended only for the addressee. It contains information that is legally privileged, confidential or otherwise protected from use or disclosure. If you are not the intended recipient, you are strictly prohibited from reviewing, disclosing, copying using or disseminating any of this information or taking any action in reliance on or regarding this information. If you have received this fax in error, please notify us immediately by telephone so that  we can arrange for its return to [place]. Phone: [phone], Toll-Free: [phone], Fax: [phone] Page: 2 of 2 Call Id: [id number] Guidelines Guideline Title Affirmed Question Affirmed Notes Nurse Date/Time Lamount Cohen Time) the body AND [2] sudden onset AND [3] present now Disp. Time Lamount Cohen Time) Disposition Final User 07/17/2023 11:18:01 AM SEE PCP WITHIN 3 DAYS Nunzio Cory RN, Sherrie 07/17/2023 11:21:26 AM Call EMS 911 Now Yes Nunzio Cory, RN, Sherrie 07/17/2023 11:29:56 AM 911 Outcome Documentation Nunzio Cory, RN, Sherrie Reason: Called the caller back and she states EMS is on their way. Final Disposition 07/17/2023 11:21:26 AM Call EMS 911 Now Yes Nunzio Cory, RN, Sherrie Caller Disagree/Comply Comply Caller Understands Yes PreDisposition InappropriateToAsk Care Advice Given Per Guideline SEE PCP WITHIN 3 DAYS: * You need to be seen within 2 or 3 days. CALL BACK IF: * Chest pain or difficulty breathing occurs * Difficulty walking, difficulty talking, or severe headache occurs * Your blood pressure is over 180/110 * You become worse CALL EMS 911 NOW: * Immediate medical attention is needed. You need to hang up and call 911 (or an ambulance). * Triager Discretion: I'll call you back in a few minutes to be sure you were able to reach them. Comments User: Tiffany Commons, RN Date/Time Lamount Cohen Time): 07/17/2023 11:22:40 AM While triaging started having numbness in left arm, states it felt off. States dizziness was better when walking around then started having numbness in left arm. Instructed to call 911. Verbalized understanding. Referrals REFERRED TO PCP OFFICE [facility] - ED

## 2023-07-17 NOTE — Telephone Encounter (Signed)
Pt called to make an appt with pcp for high bp. States her bp was 171/103 and then dropped to 152/89 along with a headache and dizziness. Transferred to triage for nurse eval.

## 2023-07-20 NOTE — Telephone Encounter (Signed)
Called patient to check up.  States things are well and she is coming in Wednesday to see Lillia Abed.

## 2023-07-21 DIAGNOSIS — Z1331 Encounter for screening for depression: Secondary | ICD-10-CM | POA: Diagnosis not present

## 2023-07-21 DIAGNOSIS — Z01419 Encounter for gynecological examination (general) (routine) without abnormal findings: Secondary | ICD-10-CM | POA: Diagnosis not present

## 2023-07-21 DIAGNOSIS — Z124 Encounter for screening for malignant neoplasm of cervix: Secondary | ICD-10-CM | POA: Diagnosis not present

## 2023-07-21 DIAGNOSIS — Z1231 Encounter for screening mammogram for malignant neoplasm of breast: Secondary | ICD-10-CM | POA: Diagnosis not present

## 2023-07-21 DIAGNOSIS — N920 Excessive and frequent menstruation with regular cycle: Secondary | ICD-10-CM | POA: Diagnosis not present

## 2023-07-21 LAB — HM MAMMOGRAPHY

## 2023-07-22 ENCOUNTER — Ambulatory Visit: Payer: BC Managed Care – PPO | Admitting: Physician Assistant

## 2023-07-22 ENCOUNTER — Encounter: Payer: Self-pay | Admitting: Physician Assistant

## 2023-07-22 VITALS — BP 124/68 | HR 77 | Temp 98.7°F | Ht 66.0 in | Wt 260.1 lb

## 2023-07-22 DIAGNOSIS — I10 Essential (primary) hypertension: Secondary | ICD-10-CM

## 2023-07-22 NOTE — Progress Notes (Signed)
Established patient visit   Patient: Tiffany Hubbard   DOB: Dec 24, 1975   47 y.o. Female  MRN: [id number] Visit Date: 07/22/2023  Today's healthcare provider: Alfredia Ferguson, PA-C   Cc. Elevated BP  Subjective     Pt reports last week, Friday she felt lightheaded and checked her blood pressure, it was 171/103. She called EMS, who when they checked read as 159/92. Reports since then pressure has returned to normal. Reports only change, she may have been dehydrated. Denies any stress, new medications, supplements.  Denies chest pain, dizziness.   She is managed on lisinopril 30 mg daily and metoprolol 25 mg BID.  Medications: Outpatient Medications Prior to Visit  Medication Sig   acetaminophen (TYLENOL) 500 MG tablet Take 1,000 mg by mouth every 6 (six) hours as needed for moderate pain.   atorvastatin (LIPITOR) 10 MG tablet Take 10 mg by mouth daily.   Drospirenone (SLYND) 4 MG TABS Take 1 tablet by mouth daily.   lisinopril (ZESTRIL) 20 MG tablet Take 30 mg by mouth daily.   metoprolol tartrate (LOPRESSOR) 25 MG tablet Take 25 mg by mouth 2 (two) times a day.   rivaroxaban (XARELTO) 10 MG TABS tablet Take 1 tablet (10 mg total) by mouth daily. Start 2 days prior to travel and stop the day after she gets home.   triamcinolone cream (KENALOG) 0.1 % Apply 1 application. topically 2 (two) times daily as needed.   No facility-administered medications prior to visit.    Review of Systems  Constitutional:  Negative for fatigue and fever.  Respiratory:  Negative for cough and shortness of breath.   Cardiovascular:  Negative for chest pain and leg swelling.  Gastrointestinal:  Negative for abdominal pain.  Neurological:  Negative for dizziness and headaches.       Objective    BP 124/68   Pulse 77   Temp 98.7 F (37.1 C) (Oral)   Ht 5\' 6"  (1.676 m)   Wt 260 lb 2 oz (118 kg)   SpO2 98%   BMI 41.99 kg/m    Physical Exam Constitutional:      General: She is  awake.     Appearance: She is well-developed.  HENT:     Head: Normocephalic.  Eyes:     Conjunctiva/sclera: Conjunctivae normal.  Cardiovascular:     Rate and Rhythm: Normal rate and regular rhythm.     Heart sounds: Normal heart sounds.  Pulmonary:     Effort: Pulmonary effort is normal.     Breath sounds: Normal breath sounds.  Skin:    General: Skin is warm.  Neurological:     Mental Status: She is alert and oriented to person, place, and time.  Psychiatric:        Attention and Perception: Attention normal.        Mood and Affect: Mood normal.        Speech: Speech normal.        Behavior: Behavior is cooperative.      No results found for any visits on 07/22/23.  Assessment & Plan    1. Primary hypertension Cont lisinopril 30 mg and metoprolol 25 mg BID Cont to monitor BP.  Advised if elevation reoccurs, to sit quietly, deep breathing, for ~ 5 minutes and recheck.   Return if symptoms worsen or fail to improve.       Alfredia Ferguson, PA-C  [facility] [phone] (phone) [phone] (fax)  [facility]

## 2023-08-26 ENCOUNTER — Encounter: Payer: BC Managed Care – PPO | Admitting: Family

## 2023-09-01 ENCOUNTER — Encounter: Payer: BC Managed Care – PPO | Admitting: Family

## 2023-09-29 ENCOUNTER — Encounter: Payer: BC Managed Care – PPO | Admitting: Family

## 2023-10-13 DIAGNOSIS — I10 Essential (primary) hypertension: Secondary | ICD-10-CM | POA: Diagnosis not present

## 2023-10-13 DIAGNOSIS — N051 Unspecified nephritic syndrome with focal and segmental glomerular lesions: Secondary | ICD-10-CM | POA: Diagnosis not present

## 2023-10-13 DIAGNOSIS — R809 Proteinuria, unspecified: Secondary | ICD-10-CM | POA: Diagnosis not present

## 2023-10-13 DIAGNOSIS — R3129 Other microscopic hematuria: Secondary | ICD-10-CM | POA: Diagnosis not present

## 2023-10-13 DIAGNOSIS — I129 Hypertensive chronic kidney disease with stage 1 through stage 4 chronic kidney disease, or unspecified chronic kidney disease: Secondary | ICD-10-CM | POA: Diagnosis not present

## 2023-10-27 ENCOUNTER — Ambulatory Visit (INDEPENDENT_AMBULATORY_CARE_PROVIDER_SITE_OTHER): Payer: BC Managed Care – PPO | Admitting: Family

## 2023-10-27 ENCOUNTER — Encounter: Payer: Self-pay | Admitting: Family

## 2023-10-27 ENCOUNTER — Other Ambulatory Visit: Payer: Self-pay | Admitting: Family

## 2023-10-27 VITALS — BP 130/82 | HR 81 | Temp 98.4°F | Ht 66.0 in | Wt 277.0 lb

## 2023-10-27 DIAGNOSIS — H6992 Unspecified Eustachian tube disorder, left ear: Secondary | ICD-10-CM | POA: Diagnosis not present

## 2023-10-27 MED ORDER — FLUTICASONE PROPIONATE 50 MCG/ACT NA SUSP: 2.0000 | Freq: Every day | NASAL | 6 refills | Status: AC

## 2023-10-27 NOTE — Progress Notes (Signed)
Tiffany Hubbard is a 48 y.o. female with the following history as recorded in EpicCare:  Patient Active Problem List   Diagnosis Date Noted   Diarrhea 02/04/2022   Iron deficiency anemia due to chronic blood loss 03/25/2019   Thrombocytosis 03/25/2019   Menorrhagia 03/25/2019   Adrenal adenoma 03/25/2019   FSGS (focal segmental glomerulosclerosis) 03/25/2019   Pulmonary embolism on left [facility]) 03/02/2019   Eczema 10/31/2013   Family history of breast cancer in mother 10/22/2012   Family history of colon cancer 10/22/2012   Screening for malignant neoplasm of cervix 10/22/2012   Thyromegaly 06/10/2012   Lymphadenopathy, submandibular 06/10/2012   Preventative health care 06/10/2012    Current Outpatient Medications  Medication Sig Dispense Refill   acetaminophen (TYLENOL) 500 MG tablet Take 1,000 mg by mouth every 6 (six) hours as needed for moderate pain.     atorvastatin (LIPITOR) 10 MG tablet Take 10 mg by mouth daily.     Drospirenone (SLYND) 4 MG TABS Take 1 tablet by mouth daily. 28 tablet    fluticasone (FLONASE) 50 MCG/ACT nasal spray Place 2 sprays into both nostrils daily. 16 g 6   lisinopril (ZESTRIL) 20 MG tablet Take 30 mg by mouth daily.     metoprolol tartrate (LOPRESSOR) 25 MG tablet Take 25 mg by mouth 2 (two) times a day.     rivaroxaban (XARELTO) 10 MG TABS tablet Take 1 tablet (10 mg total) by mouth daily. Start 2 days prior to travel and stop the day after she gets home. 30 tablet 0   triamcinolone cream (KENALOG) 0.1 % Apply 1 application. topically 2 (two) times daily as needed. 30 g 2   No current facility-administered medications for this visit.    Allergies: Patient has no known allergies.  Past Medical History:  Diagnosis Date   Anemia    Asthma    Bronchitis    Cardiac arrhythmia    Focal segmental glomerulosclerosis    GERD (gastroesophageal reflux disease)    Heart murmur    HTN (hypertension)    Pulmonary emboli (HCC)     Past Surgical  History:  Procedure Laterality Date   ECTOPIC PREGNANCY SURGERY     TUBAL LIGATION      Family History  Problem Relation Age of Onset   Lung cancer Mother    Breast cancer Mother 7   Diabetes Mother    Heart disease Father 62   Hypertension Father    Colon cancer Maternal Aunt        in 74's   Cancer Maternal Aunt        spinal   Colon cancer Maternal Uncle 48   Diabetes Maternal Grandmother    Stomach cancer Maternal Grandmother    Arthritis Maternal Grandfather    Colon polyps Neg Hx    Esophageal cancer Neg Hx    Rectal cancer Neg Hx     Social History   Tobacco Use   Smoking status: Former    Current packs/day: 0.00    Types: Cigarettes    Quit date: 12/10/2008    Years since quitting: 14.8   Smokeless tobacco: Never  Substance Use Topics   Alcohol use: Yes    Alcohol/week: 2.0 standard drinks of alcohol    Types: 2 Glasses of wine per week    Subjective:   1 week history of left ear pain; has heard "heart beating in left ear." Has had to have ear wax flushed in the past; no sinus pain,  pressure or sore throat; no fever; no hearing loss;     Objective:  Vitals:   10/27/23 1551  BP: 130/82  Pulse: 81  Temp: 98.4 F (36.9 C)  TempSrc: Oral  SpO2: 99%  Weight: 277 lb (125.6 kg)  Height: 5\' 6"  (1.676 m)    General: Well developed, well nourished, in no acute distress  Skin : Warm and dry.  Head: Normocephalic and atraumatic  Eyes: Sclera and conjunctiva clear; pupils round and reactive to light; extraocular movements intact  Ears: External normal; canals clear; tympanic membranes normal  Oropharynx: Pink, supple. No suspicious lesions  Neck: Supple without thyromegaly, adenopathy  Lungs: Respirations unlabored; clear to auscultation bilaterally without wheeze, rales, rhonchi  CVS exam: normal rate and regular rhythm.  Neurologic: Alert and oriented; speech intact; face symmetrical; moves all extremities well; CNII-XII intact without focal deficit    Assessment:  1. Dysfunction of left eustachian tube     Plan:   Physical exam is reassuring; trial of Flonase NS- use bid x 5-7 days; follow up worse, no better- can consider low dose oral prednisone if needed;   No follow-ups on file.  No orders of the defined types were placed in this encounter.   Requested Prescriptions   Signed Prescriptions Disp Refills   fluticasone (FLONASE) 50 MCG/ACT nasal spray 16 g 6    Sig: Place 2 sprays into both nostrils daily.

## 2023-10-28 ENCOUNTER — Encounter: Payer: BC Managed Care – PPO | Admitting: Family

## 2023-11-03 ENCOUNTER — Ambulatory Visit (INDEPENDENT_AMBULATORY_CARE_PROVIDER_SITE_OTHER): Payer: BC Managed Care – PPO | Admitting: Family

## 2023-11-03 VITALS — BP 138/84 | HR 88 | Temp 97.5°F | Resp 16 | Ht 66.0 in | Wt 270.0 lb

## 2023-11-03 DIAGNOSIS — G500 Trigeminal neuralgia: Secondary | ICD-10-CM

## 2023-11-03 DIAGNOSIS — H9312 Tinnitus, left ear: Secondary | ICD-10-CM | POA: Diagnosis not present

## 2023-11-03 MED ORDER — CARBAMAZEPINE ER 100 MG PO CP12: 100.0000 mg | ORAL_CAPSULE | Freq: Two times a day (BID) | ORAL | 1 refills | Status: DC

## 2023-11-03 NOTE — Assessment & Plan Note (Signed)
  Unilateral facial pain and tinnitus. Hx concerning for Trigeminal Neuralgia -Will rx with trial of tegretol.   -Refer to ENT for evaluation of tinnitus and possible hearing testing. -Reviewed medication interaction with Slynd. She states that she is taking Slynd for heavy periods and not using it for birth control.  Tegretol can lower drospirenone level. Advised pt to monitor for changes in menstrual bleeding. She states she does not take xarelto regularly- only when she travels for DVT prophylaxis.

## 2023-11-03 NOTE — Patient Instructions (Signed)
VISIT SUMMARY:  Today, you were seen for ringing in your left ear and headaches. The ringing started after congestion, which has since resolved, but the ringing persists. You also have sharp headaches on the left side, especially with movement. We discussed these symptoms and possible causes, and created a plan to address them.  YOUR PLAN:  -TRIGEMINAL NEURALGIA: Trigeminal neuralgia is a condition that causes severe facial pain, often triggered by movement. We will start you on medication called carbamazepine to manage the pain. You will also be referred to an ENT specialist to evaluate the ringing in your ear and possibly conduct a hearing test.    INSTRUCTIONS:  Please follow up in 1 month to assess your response to the medication and discuss the results of your ENT consultation.

## 2023-11-03 NOTE — Assessment & Plan Note (Signed)
New. May be due to trigeminal neuralgia.  Denies hearing deficit in the left ear. Will refer to ENT.

## 2023-11-03 NOTE — Progress Notes (Signed)
Subjective:     Patient ID: Tiffany Hubbard, female    DOB: 08/02/1976, 48 y.o.   MRN: [id number]  Chief Complaint  Patient presents with   Tinnitus    Patient complains of ringing in left ear    Headache    Patient complains of pain in head with sudden movements.    HPI  Discussed the use of AI scribe software for clinical note transcription with the patient, who gave verbal consent to proceed.  History of Present Illness   The patient presents with c/o left ear ringing and headaches.  She experiences persistent ringing in her left ear, described as a 'thumping' or hearing her heartbeat. This symptom began after significant congestion, which resolved with the use of Flonase. Despite the resolution of congestion, the ringing in the ear continued, though today it is less severe than usual. No hearing loss in the left ear is reported.  She has developed headaches, particularly when looking down or turning her head. These headaches are sharp and excruciating, occurring on the left side and radiating into the left side of her face. She reports that her symptoms last a few seconds before subsiding. Sleeping on the left side worsens the headaches, while switching sides provides relief. She stopped taking Flonase on Saturday after reading it could cause headaches, but the headaches persist. Pain is only experienced with sharp movements, which is severe enough to make her stop and stand still until it subsides.          Health Maintenance Due  Topic Date Due   INFLUENZA VACCINE  05/07/2023   COVID-19 Vaccine (3 - 2024-25 season) 06/07/2023    Past Medical History:  Diagnosis Date   Anemia    Asthma    Bronchitis    Cardiac arrhythmia    Focal segmental glomerulosclerosis    GERD (gastroesophageal reflux disease)    Heart murmur    HTN (hypertension)    Pulmonary emboli (HCC)       Past Surgical History:  Procedure Laterality Date   ECTOPIC PREGNANCY SURGERY     TUBAL  LIGATION      Family History  Problem Relation Age of Onset   Lung cancer Mother    Breast cancer Mother 13   Diabetes Mother    Heart disease Father 50   Hypertension Father    Colon cancer Maternal Aunt        in 31's   Cancer Maternal Aunt        spinal   Colon cancer Maternal Uncle 48   Diabetes Maternal Grandmother    Stomach cancer Maternal Grandmother    Arthritis Maternal Grandfather    Colon polyps Neg Hx    Esophageal cancer Neg Hx    Rectal cancer Neg Hx     Social History   Socioeconomic History   Marital status: Married    Spouse name: Not on file   Number of children: 0   Years of education: Not on file   Highest education level: Not on file  Occupational History   Occupation: Electronics engineer: [employer]    Comment: [employer]   Occupation: control review  Tobacco Use   Smoking status: Former    Current packs/day: 0.00    Types: Cigarettes    Quit date: 12/10/2008    Years since quitting: 14.9   Smokeless tobacco: Never  Vaping Use   Vaping status: Never Used  Substance and Sexual Activity   Alcohol  use: Yes    Alcohol/week: 2.0 standard drinks of alcohol    Types: 2 Glasses of wine per week   Drug use: No   Sexual activity: Yes    Partners: Male    Birth control/protection: Surgical  Other Topics Concern   Not on file  Social History Narrative   No children   Step daughter- does not live with patient   Married   2 dogs- shitzus   Enjoys movies, books, travelling   Completed bachelors degree   Works for [employer] from home   Social Drivers of Health   Financial Resource Strain: Not on file  Food Insecurity: Not on file  Transportation Needs: Not on file  Physical Activity: Not on file  Stress: Not on file (08/15/2023)  Social Connections: Not on file  Intimate Partner Violence: Not on file    Outpatient Medications Prior to Visit  Medication Sig Dispense Refill   acetaminophen (TYLENOL) 500 MG tablet Take 1,000  mg by mouth every 6 (six) hours as needed for moderate pain.     atorvastatin (LIPITOR) 10 MG tablet Take 10 mg by mouth daily.     Drospirenone (SLYND) 4 MG TABS Take 1 tablet by mouth daily. 28 tablet    fluticasone (FLONASE) 50 MCG/ACT nasal spray Place 2 sprays into both nostrils daily. 16 g 6   lisinopril (ZESTRIL) 20 MG tablet Take 30 mg by mouth daily.     metoprolol tartrate (LOPRESSOR) 25 MG tablet Take 25 mg by mouth 2 (two) times a day.     rivaroxaban (XARELTO) 10 MG TABS tablet Take 1 tablet (10 mg total) by mouth daily. Start 2 days prior to travel and stop the day after she gets home. 30 tablet 0   triamcinolone cream (KENALOG) 0.1 % Apply 1 application. topically 2 (two) times daily as needed. 30 g 2   No facility-administered medications prior to visit.    No Known Allergies  ROS    See HPI Objective:    Physical Exam Constitutional:      General: She is not in acute distress.    Appearance: Normal appearance. She is well-developed.  HENT:     Head: Normocephalic and atraumatic.     Right Ear: External ear normal.     Left Ear: External ear normal.  Eyes:     General: No scleral icterus. Neck:     Thyroid: No thyromegaly.  Cardiovascular:     Rate and Rhythm: Normal rate and regular rhythm.     Heart sounds: Normal heart sounds. No murmur heard. Pulmonary:     Effort: Pulmonary effort is normal. No respiratory distress.     Breath sounds: Normal breath sounds. No wheezing.  Musculoskeletal:     Cervical back: Neck supple.  Skin:    General: Skin is warm and dry.  Neurological:     Mental Status: She is alert and oriented to person, place, and time.     Cranial Nerves: No cranial nerve deficit.     Sensory: No sensory deficit (facial).     Motor: No weakness.  Psychiatric:        Mood and Affect: Mood normal.        Behavior: Behavior normal.        Thought Content: Thought content normal.        Judgment: Judgment normal.      BP 138/84   Pulse  88   Temp (!) 97.5 F (36.4 C) (Oral)  Resp 16   Ht 5\' 6"  (1.676 m)   Wt 270 lb (122.5 kg)   LMP 10/24/2023 (Exact Date)   SpO2 97%   BMI 43.58 kg/m  Wt Readings from Last 3 Encounters:  11/03/23 270 lb (122.5 kg)  10/27/23 277 lb (125.6 kg)  07/22/23 260 lb 2 oz (118 kg)       Assessment & Plan:   Problem List Items Addressed This Visit       Unprioritized   Trigeminal neuralgia    Unilateral facial pain and tinnitus. Hx concerning for Trigeminal Neuralgia -Will rx with trial of tegretol.   -Refer to ENT for evaluation of tinnitus and possible hearing testing. -Reviewed medication interaction with Slynd. She states that she is taking Slynd for heavy periods and not using it for birth control.  Tegretol can lower drospirenone level. Advised pt to monitor for changes in menstrual bleeding. She states she does not take xarelto regularly- only when she travels for DVT prophylaxis.       Relevant Medications   carbamazepine (CARBATROL) 100 MG 12 hr capsule   Tinnitus of left ear - Primary   New. May be due to trigeminal neuralgia.  Denies hearing deficit in the left ear. Will refer to ENT.        Relevant Orders   Ambulatory referral to ENT    I am having Joni Reining T. Schirm start on carbamazepine. I am also having her maintain her acetaminophen, metoprolol tartrate, lisinopril, Slynd, atorvastatin, triamcinolone cream, rivaroxaban, and fluticasone.  Meds ordered this encounter  Medications   carbamazepine (CARBATROL) 100 MG 12 hr capsule    Sig: Take 1 capsule (100 mg total) by mouth 2 (two) times daily.    Dispense:  60 capsule    Refill:  1    Supervising Provider:   Danise Edge A [[id number]]

## 2023-11-11 ENCOUNTER — Ambulatory Visit: Payer: BC Managed Care – PPO | Admitting: Family Medicine

## 2023-11-12 ENCOUNTER — Encounter (HOSPITAL_BASED_OUTPATIENT_CLINIC_OR_DEPARTMENT_OTHER): Payer: Self-pay | Admitting: Emergency Medicine

## 2023-11-12 ENCOUNTER — Emergency Department (HOSPITAL_BASED_OUTPATIENT_CLINIC_OR_DEPARTMENT_OTHER)
Admission: EM | Admit: 2023-11-12 | Discharge: 2023-11-12 | Disposition: A | Discharge status: Home / Self Care | Payer: BC Managed Care – PPO | Attending: Emergency Medicine | Admitting: Emergency Medicine

## 2023-11-12 ENCOUNTER — Emergency Department (HOSPITAL_BASED_OUTPATIENT_CLINIC_OR_DEPARTMENT_OTHER): Payer: BC Managed Care – PPO

## 2023-11-12 DIAGNOSIS — J45909 Unspecified asthma, uncomplicated: Secondary | ICD-10-CM | POA: Insufficient documentation

## 2023-11-12 DIAGNOSIS — R519 Headache, unspecified: Secondary | ICD-10-CM | POA: Insufficient documentation

## 2023-11-12 DIAGNOSIS — I10 Essential (primary) hypertension: Secondary | ICD-10-CM | POA: Insufficient documentation

## 2023-11-12 NOTE — ED Provider Triage Note (Signed)
 Emergency Medicine Provider Triage Evaluation Note  Tiffany Hubbard , a 48 y.o. female  was evaluated in triage.  Pt complains of headache and tinnitus.  Review of Systems  Positive: As above Negative: As above  Physical Exam  BP (!) 173/99   Pulse 79   Temp 98 F (36.7 C)   Resp 16   Wt 120.2 kg   LMP 10/24/2023 (Exact Date)   SpO2 98%   BMI 42.77 kg/m  Gen:   Awake, no distress  Resp:  Normal effort  MSK:   Moves extremities without difficulty  Other:    Medical Decision Making  Medically screening exam initiated at 3:27 PM.  Appropriate orders placed.  DEIONDRA DENLEY was informed that the remainder of the evaluation will be completed by another provider, this initial triage assessment does not replace that evaluation, and the importance of remaining in the ED until their evaluation is complete.     Hildegard Loge, PA-C 11/12/23 1527

## 2023-11-12 NOTE — ED Provider Notes (Signed)
 [place] EMERGENCY DEPARTMENT AT [facility] Provider Note   CSN: [id number] Arrival date & time: 11/12/23  1417     History  Chief Complaint  Patient presents with   Headache    Tiffany Hubbard is a 48 y.o. female.  48 year old female presents today for concern of headache with associated tinnitus ongoing for about a month.  She denies chest pain, shortness of breath, balance issues, or vision change.  She states she is just concerned about stroke or something serious causing a headache.  She does have access to close follow-up with PCP.  She is compliant with her home medications.  Her blood pressure has been running high recently.  No other complaints.  The history is provided by the patient. No language interpreter was used.       Home Medications Prior to Admission medications   Medication Sig Start Date End Date Taking? Authorizing Provider  acetaminophen  (TYLENOL ) 500 MG tablet Take 1,000 mg by mouth every 6 (six) hours as needed for moderate pain.    [provider]  atorvastatin  (LIPITOR) 10 MG tablet Take 10 mg by mouth daily. 10/06/20   [provider]  carbamazepine  (CARBATROL ) 100 MG 12 hr capsule Take 1 capsule (100 mg total) by mouth 2 (two) times daily. 11/03/23   Daryl Setter, NP  Drospirenone  (SLYND ) 4 MG TABS Take 1 tablet by mouth daily. 07/20/20   O'Sullivan, Melissa, NP  fluticasone  (FLONASE ) 50 MCG/ACT nasal spray Place 2 sprays into both nostrils daily. 10/27/23   Jason Leita Repine, FNP  lisinopril (ZESTRIL) 20 MG tablet Take 30 mg by mouth daily. 08/08/19   [provider]  metoprolol  tartrate (LOPRESSOR ) 25 MG tablet Take 25 mg by mouth 2 (two) times a day. 03/28/19   [provider]  rivaroxaban  (XARELTO ) 10 MG TABS tablet Take 1 tablet (10 mg total) by mouth daily. Start 2 days prior to travel and stop the day after she gets home. 12/01/22   Franchot Lauraine HERO, NP  triamcinolone  cream (KENALOG ) 0.1 %  Apply 1 application. topically 2 (two) times daily as needed. 02/24/22   O'Sullivan, Melissa, NP      Allergies    Patient has no known allergies.    Review of Systems   Review of Systems  Physical Exam Updated Vital Signs BP (!) 173/99   Pulse 79   Temp 98 F (36.7 C)   Resp 16   Wt 120.2 kg   LMP 10/24/2023 (Exact Date)   SpO2 98%   BMI 42.77 kg/m  Physical Exam  ED Results / Procedures / Treatments   Labs (all labs ordered are listed, but only abnormal results are displayed) Labs Reviewed - No data to display  EKG None  Radiology CT Head Wo Contrast Result Date: 11/12/2023 CLINICAL DATA:  Headache, new onset (Age >= 51y) EXAM: CT HEAD WITHOUT CONTRAST TECHNIQUE: Contiguous axial images were obtained from the base of the skull through the vertex without intravenous contrast. RADIATION DOSE REDUCTION: This exam was performed according to the departmental dose-optimization program which includes automated exposure control, adjustment of the mA and/or kV according to patient size and/or use of iterative reconstruction technique. COMPARISON:  None Available. FINDINGS: Brain: No evidence of acute infarction, hemorrhage, hydrocephalus, extra-axial collection or mass lesion/mass effect. Vascular: No hyperdense vessel or unexpected calcification. Skull: Normal. Negative for fracture or focal lesion. Sinuses/Orbits: No acute finding. Other: None. IMPRESSION: No CT etiology for headaches identified. Electronically Signed   By: Lyndall  Meade M.D.   On: 11/12/2023 15:42    Procedures Procedures    Medications Ordered in ED Medications - No data to display  ED Course/ Medical Decision Making/ A&P                                 Medical Decision Making Amount and/or Complexity of Data Reviewed Radiology: ordered.   Medical Decision Making / ED Course   This patient presents to the ED for concern of headache, tinnitus, this involves an extensive number of treatment options,  and is a complaint that carries with it a high risk of complications and morbidity.  The differential diagnosis includes vertigo, BPPV, tension headache, migraine headache, cluster headache, hypertensive emergency  MDM: 48 year old female presents today for concern of headache with associated tenderness.  No dizziness or lightheadedness.  Reports compliance with her home BP medications.  BP is high in the emergency department 173/99.  Without chest pain, shortness of breath, vision change, balance issues.  No evidence of endorgan damage.  Discussed blood pressure diary and close follow-up with PCP.  She states she will be able to follow-up with PCP this week.  CT head obtained.  No acute intracranial process.  No concerning cause of her headache.  Symptomatic management discussed.  Discharged in stable condition.  Return precaution discussed.  Patient voices understanding and is in agreement with plan.  Lab Tests: -I ordered, reviewed, and interpreted labs.   The pertinent results include:   Labs Reviewed - No data to display    EKG  EKG Interpretation Date/Time:    Ventricular Rate:    PR Interval:    QRS Duration:    QT Interval:    QTC Calculation:   R Axis:      Text Interpretation:           Imaging Studies ordered: I ordered imaging studies including ct head I independently visualized and interpreted imaging. I agree with the radiologist interpretation   Medicines ordered and prescription drug management: No orders of the defined types were placed in this encounter.   -I have reviewed the patients home medicines and have made adjustments as needed   Reevaluation: After the interventions noted above, I reevaluated the patient and found that they have :improved  Co morbidities that complicate the patient evaluation  Past Medical History:  Diagnosis Date   Anemia    Asthma    Bronchitis    Cardiac arrhythmia    Focal segmental glomerulosclerosis    GERD  (gastroesophageal reflux disease)    Heart murmur    HTN (hypertension)    Pulmonary emboli (HCC)       Dispostion: Discharged in stable condition.  Return precaution discussed.  Patient voices understanding and is in agreement with plan.   Final Clinical Impression(s) / ED Diagnoses Final diagnoses:  Bad headache    Rx / DC Orders ED Discharge Orders     None         Hildegard Loge, PA-C 11/12/23 2151    Lenor Hollering, MD 11/13/23 1223

## 2023-11-12 NOTE — Discharge Instructions (Signed)
 Please follow-up with your primary care provider.  Return if you develop any vision issues, balance issues, chest pain, shortness of breath.  Keep a blood pressure diary.  Check your blood pressure at the same time every day.  Continue taking your home blood pressure medications.

## 2023-11-12 NOTE — ED Triage Notes (Signed)
 Pt c/o LT ear "ringing" x 1 month, LT side head pain intermittently x 2 weeks. HA Started 2 days after using flonase 

## 2023-11-12 NOTE — ED Notes (Signed)
 RN reviewed discharge instructions with pt. Pt verbalized understanding and had no further questions. VSS upon discharge.

## 2023-11-19 ENCOUNTER — Ambulatory Visit: Payer: Self-pay | Admitting: Family

## 2023-11-19 NOTE — Telephone Encounter (Signed)
  Chief Complaint: right groin/hip pain Symptoms: right groin and hip pain, pain radiates down right leg with numbness sometimes when using heating pad Frequency: x 6 days Pertinent Negatives: Patient denies lower back pain, rash, fever, loss of bowel or bladder control, weakness. Disposition: [] ED /[] Urgent Care (no appt availability in office) / [x] Appointment(In office/virtual)/ []  [place] [facility]/ [] Home Care/ [] Refused Recommended Disposition /[] [place] [facility]/ []  Follow-up with PCP Additional Notes: Patient states she has been treating with a heating pad, Tylenol, and resting with a pillow between her legs. Patient states she can not take NSAIDs. Instructed patient to use heating pad 10-20 minutes and no more than 3 times a day. Patient agreeable to office visit tomorrow with PCP. Copied from CRM [id number]. Topic: Clinical - Red Word Triage >> Nov 19, 2023 11:15 AM Prudencio Pair wrote: Red Word that prompted transfer to Nurse Triage: Patient states she can barely walk. Not sure if she pulled a muscle on her right side, mainly the hip area. Wants to see Dr. Peggyann Juba if possible tomorrow. Reason for Disposition  Numbness in a leg or foot (i.e., loss of sensation)  Answer Assessment - Initial Assessment Questions 1. LOCATION and RADIATION: "Where is the pain located?"      Right groin and wraps around to outside of the right hip.  2. QUALITY: "What does the pain feel like?"  (e.g., sharp, dull, aching, burning)     Aching, sharp pain.   3. SEVERITY: "How bad is the pain?" "What does it keep you from doing?"   (Scale 1-10; or mild, moderate, severe)   -  MILD (1-3): doesn't interfere with normal activities    -  MODERATE (4-7): interferes with normal activities (e.g., work or school) or awakens from sleep, limping    -  SEVERE (8-10): excruciating pain, unable to do any normal activities, unable to walk     5-6/10. Last dose of Tylenol 0900 this AM.  4. ONSET: "When did the  pain start?" "Does it come and go, or is it there all the time?"     Friday morning when she woke up. "Mostly if I start walking it agitates really bad and at night time I can't sleep"  5. WORK OR EXERCISE: "Has there been any recent work or exercise that involved this part of the body?"      Denies.  6. CAUSE: "What do you think is causing the hip pain?"      Denies.  7. AGGRAVATING FACTORS: "What makes the hip pain worse?" (e.g., walking, climbing stairs, running)     Turning or walking on it.  8. OTHER SYMPTOMS: "Do you have any other symptoms?" (e.g., back pain, pain shooting down leg,  fever, rash)     Pain shoots down right leg sometimes when using heating pad, she states on Tuesday night she was using the heading pad it is caused numbness in her right leg.  Protocols used: Hip Pain-A-AH

## 2023-11-20 ENCOUNTER — Other Ambulatory Visit (HOSPITAL_BASED_OUTPATIENT_CLINIC_OR_DEPARTMENT_OTHER): Payer: Self-pay

## 2023-11-20 ENCOUNTER — Ambulatory Visit (INDEPENDENT_AMBULATORY_CARE_PROVIDER_SITE_OTHER): Payer: BC Managed Care – PPO | Admitting: Family

## 2023-11-20 ENCOUNTER — Encounter: Payer: Self-pay | Admitting: Family

## 2023-11-20 VITALS — BP 135/70 | HR 93 | Temp 97.8°F | Resp 16 | Ht 66.0 in | Wt 271.0 lb

## 2023-11-20 DIAGNOSIS — M5416 Radiculopathy, lumbar region: Secondary | ICD-10-CM | POA: Insufficient documentation

## 2023-11-20 DIAGNOSIS — Z23 Encounter for immunization: Secondary | ICD-10-CM | POA: Diagnosis not present

## 2023-11-20 DIAGNOSIS — H9312 Tinnitus, left ear: Secondary | ICD-10-CM | POA: Diagnosis not present

## 2023-11-20 DIAGNOSIS — I10 Essential (primary) hypertension: Secondary | ICD-10-CM | POA: Diagnosis not present

## 2023-11-20 DIAGNOSIS — G500 Trigeminal neuralgia: Secondary | ICD-10-CM | POA: Diagnosis not present

## 2023-11-20 MED ORDER — MELOXICAM 7.5 MG PO TABS
7.5000 mg | ORAL_TABLET | Freq: Every day | ORAL | 0 refills | Status: DC
  Filled 2023-11-20 (×2): qty 14, 14d supply, fill #0

## 2023-11-20 MED ORDER — CYCLOBENZAPRINE HCL 10 MG PO TABS
10.0000 mg | ORAL_TABLET | Freq: Every evening | ORAL | 0 refills | Status: DC | PRN
  Filled 2023-11-20 (×2): qty 20, 20d supply, fill #0

## 2023-11-20 NOTE — Assessment & Plan Note (Addendum)
New.  Will rx with meloxicam and flexeril HS prn.  Pt is advised to let me know if symptoms worsen or if symptoms do not improve in the next 1 week. (She is not currently taking xarelto).

## 2023-11-20 NOTE — Progress Notes (Addendum)
Subjective:     Patient ID: Tiffany Hubbard, female    DOB: 04-24-1976, 48 y.o.   MRN: [id number]  Chief Complaint  Patient presents with   Groin Pain    Patient complains of right inguinal pain for about a week. Getting worse to upper leg.     Groin Pain    Discussed the use of AI scribe software for clinical note transcription with the patient, who gave verbal consent to proceed.  History of Present Illness         Reports that her ear ringing is much better. HA resolved when she restarted lisinopril which she had not taken. She did not need to start the tegretol.    1 week ago she developed right groin pain 1 week ago. Then it began to affect the right anterior thigh and right buttock.  Heat makes it better and laying flat in bed.  Pain is made worse by walking.  During the day the pain is a bit better but worsens as the day goes on and night she is having trouble sleeping due to pain. She has tried tylenol without much improvement. No improvement with old tablet of hydrococodone she had on hand.   She would like flu shot today.     Health Maintenance Due  Topic Date Due   INFLUENZA VACCINE  05/07/2023   COVID-19 Vaccine (3 - 2024-25 season) 06/07/2023    Past Medical History:  Diagnosis Date   Anemia    Asthma    Bronchitis    Cardiac arrhythmia    Focal segmental glomerulosclerosis    GERD (gastroesophageal reflux disease)    Heart murmur    HTN (hypertension)    Pulmonary emboli (HCC)     Past Surgical History:  Procedure Laterality Date   ECTOPIC PREGNANCY SURGERY     TUBAL LIGATION      Family History  Problem Relation Age of Onset   Lung cancer Mother    Breast cancer Mother 71   Diabetes Mother    Heart disease Father 48   Hypertension Father    Colon cancer Maternal Aunt        in 79's   Cancer Maternal Aunt        spinal   Colon cancer Maternal Uncle 48   Diabetes Maternal Grandmother    Stomach cancer Maternal Grandmother    Arthritis  Maternal Grandfather    Colon polyps Neg Hx    Esophageal cancer Neg Hx    Rectal cancer Neg Hx     Social History   Socioeconomic History   Marital status: Married    Spouse name: Not on file   Number of children: 0   Years of education: Not on file   Highest education level: Not on file  Occupational History   Occupation: Electronics engineer: [employer]    Comment: [employer]   Occupation: control review  Tobacco Use   Smoking status: Former    Current packs/day: 0.00    Types: Cigarettes    Quit date: 12/10/2008    Years since quitting: 14.9   Smokeless tobacco: Never  Vaping Use   Vaping status: Never Used  Substance and Sexual Activity   Alcohol use: Yes    Alcohol/week: 2.0 standard drinks of alcohol    Types: 2 Glasses of wine per week   Drug use: Yes    Types: Marijuana   Sexual activity: Yes    Partners: Male  Birth control/protection: Surgical  Other Topics Concern   Not on file  Social History Narrative   No children   Step daughter- does not live with patient   Married   2 dogs- shitzus   Enjoys movies, books, travelling   Completed bachelors degree   Works for [employer] from home   Social Drivers of Health   Financial Resource Strain: Not on file  Food Insecurity: Not on file  Transportation Needs: Not on file  Physical Activity: Not on file  Stress: Not on file (08/15/2023)  Social Connections: Not on file  Intimate Partner Violence: Not on file    Outpatient Medications Prior to Visit  Medication Sig Dispense Refill   acetaminophen (TYLENOL) 500 MG tablet Take 1,000 mg by mouth every 6 (six) hours as needed for moderate pain.     atorvastatin (LIPITOR) 10 MG tablet Take 10 mg by mouth daily.     fluticasone (FLONASE) 50 MCG/ACT nasal spray Place 2 sprays into both nostrils daily. 16 g 6   lisinopril (ZESTRIL) 20 MG tablet Take 30 mg by mouth daily.     metoprolol tartrate (LOPRESSOR) 25 MG tablet Take 25 mg by mouth 2 (two)  times a day.     norethindrone (MICRONOR) 0.35 MG tablet Take 1 tablet by mouth daily.     rivaroxaban (XARELTO) 10 MG TABS tablet Take 1 tablet (10 mg total) by mouth daily. Start 2 days prior to travel and stop the day after she gets home. 30 tablet 0   triamcinolone cream (KENALOG) 0.1 % Apply 1 application. topically 2 (two) times daily as needed. 30 g 2   carbamazepine (CARBATROL) 100 MG 12 hr capsule Take 1 capsule (100 mg total) by mouth 2 (two) times daily. 60 capsule 1   Drospirenone (SLYND) 4 MG TABS Take 1 tablet by mouth daily. 28 tablet    No facility-administered medications prior to visit.    No Known Allergies  ROS See HPI    Objective:    Physical Exam Constitutional:      General: She is not in acute distress.    Appearance: Normal appearance. She is well-developed.  HENT:     Head: Normocephalic and atraumatic.     Right Ear: External ear normal.     Left Ear: External ear normal.  Eyes:     General: No scleral icterus. Neck:     Thyroid: No thyromegaly.  Cardiovascular:     Rate and Rhythm: Normal rate and regular rhythm.     Heart sounds: Normal heart sounds. No murmur heard. Pulmonary:     Effort: Pulmonary effort is normal. No respiratory distress.     Breath sounds: Normal breath sounds. No wheezing.  Musculoskeletal:        General: No swelling.     Cervical back: Neck supple.     Comments: Bilateral LE strength is 5/5  Skin:    General: Skin is warm and dry.  Neurological:     Mental Status: She is alert and oriented to person, place, and time.     Deep Tendon Reflexes:     Reflex Scores:      Patellar reflexes are 1+ on the right side and 3+ on the left side.    Comments: + pain right groin with straight leg raise, no tenderness to palpation of right groin area  Psychiatric:        Mood and Affect: Mood normal.  Behavior: Behavior normal.        Thought Content: Thought content normal.        Judgment: Judgment normal.      BP  135/70 (BP Location: Right Arm, Patient Position: Sitting, Cuff Size: Large)   Pulse 93   Temp 97.8 F (36.6 C) (Oral)   Resp 16   Ht 5\' 6"  (1.676 m)   Wt 271 lb (122.9 kg)   LMP 10/24/2023 (Exact Date)   SpO2 97%   BMI 43.74 kg/m  Wt Readings from Last 3 Encounters:  11/20/23 271 lb (122.9 kg)  11/12/23 265 lb (120.2 kg)  11/03/23 270 lb (122.5 kg)       Assessment & Plan:   Problem List Items Addressed This Visit       Unprioritized   RESOLVED: Trigeminal neuralgia   Symptoms resolved. She did not need to start tegretol.       Relevant Medications   cyclobenzaprine (FLEXERIL) 10 MG tablet   Tinnitus of left ear   Resolved. She attributes this to non-compliance with lisinopril which she has since restarted.       Lumbar radiculopathy - Primary   New.  Will rx with meloxicam and flexeril HS prn.  Pt is advised to let me know if symptoms worsen or if symptoms do not improve in the next 1 week. (She is not currently taking xarelto).      Relevant Medications   meloxicam (MOBIC) 7.5 MG tablet   cyclobenzaprine (FLEXERIL) 10 MG tablet   Hypertension   Stable on metoprolol and lisinopril.        I have discontinued Joni Reining T. Nesheim's Slynd and carbamazepine. I am also having her start on meloxicam and cyclobenzaprine. Additionally, I am having her maintain her acetaminophen, metoprolol tartrate, lisinopril, atorvastatin, triamcinolone cream, rivaroxaban, fluticasone, and norethindrone.  Meds ordered this encounter  Medications   meloxicam (MOBIC) 7.5 MG tablet    Sig: Take 1 tablet (7.5 mg total) by mouth daily.    Dispense:  14 tablet    Refill:  0    Supervising Provider:   Danise Edge A [[id number]]   cyclobenzaprine (FLEXERIL) 10 MG tablet    Sig: Take 1 tablet (10 mg total) by mouth at bedtime as needed for muscle spasms.    Dispense:  20 tablet    Refill:  0    Supervising Provider:   Danise Edge A [[id number]]

## 2023-11-20 NOTE — Assessment & Plan Note (Signed)
Resolved. She attributes this to non-compliance with lisinopril which she has since restarted.

## 2023-11-20 NOTE — Assessment & Plan Note (Signed)
Stable on metoprolol and lisinopril.

## 2023-11-20 NOTE — Patient Instructions (Signed)
Please start meloxicam (anti-inflammatory) and flexeril (muscle relaxer at bedtime as needed).   Call if symptoms worsen or if not improved in 1 week.

## 2023-11-20 NOTE — Addendum Note (Signed)
Addended by: Wilford Corner on: 11/20/2023 03:27 PM   Modules accepted: Orders

## 2023-11-20 NOTE — Assessment & Plan Note (Signed)
Symptoms resolved. She did not need to start tegretol.

## 2023-11-25 ENCOUNTER — Inpatient Hospital Stay: Payer: BC Managed Care – PPO | Attending: Family

## 2023-11-25 ENCOUNTER — Inpatient Hospital Stay (HOSPITAL_BASED_OUTPATIENT_CLINIC_OR_DEPARTMENT_OTHER): Payer: BC Managed Care – PPO | Admitting: Family

## 2023-11-25 ENCOUNTER — Encounter: Payer: Self-pay | Admitting: Family

## 2023-11-25 VITALS — BP 127/61 | HR 78 | Temp 98.6°F | Resp 18 | Wt 272.0 lb

## 2023-11-25 DIAGNOSIS — N920 Excessive and frequent menstruation with regular cycle: Secondary | ICD-10-CM | POA: Diagnosis not present

## 2023-11-25 DIAGNOSIS — I2699 Other pulmonary embolism without acute cor pulmonale: Secondary | ICD-10-CM

## 2023-11-25 DIAGNOSIS — Z86711 Personal history of pulmonary embolism: Secondary | ICD-10-CM | POA: Insufficient documentation

## 2023-11-25 DIAGNOSIS — Z7901 Long term (current) use of anticoagulants: Secondary | ICD-10-CM | POA: Insufficient documentation

## 2023-11-25 DIAGNOSIS — D500 Iron deficiency anemia secondary to blood loss (chronic): Secondary | ICD-10-CM | POA: Diagnosis not present

## 2023-11-25 LAB — CBC WITH DIFFERENTIAL (CANCER CENTER ONLY)
Abs Immature Granulocytes: 0.01 10*3/uL (ref 0.00–0.07)
Basophils Absolute: 0 10*3/uL (ref 0.0–0.1)
Basophils Relative: 1 %
Eosinophils Absolute: 0.3 10*3/uL (ref 0.0–0.5)
Eosinophils Relative: 7 %
HCT: 40.7 % (ref 36.0–46.0)
Hemoglobin: 13.1 g/dL (ref 12.0–15.0)
Immature Granulocytes: 0 %
Lymphocytes Relative: 30 %
Lymphs Abs: 1.4 10*3/uL (ref 0.7–4.0)
MCH: 28.3 pg (ref 26.0–34.0)
MCHC: 32.2 g/dL (ref 30.0–36.0)
MCV: 87.9 fL (ref 80.0–100.0)
Monocytes Absolute: 0.3 10*3/uL (ref 0.1–1.0)
Monocytes Relative: 6 %
Neutro Abs: 2.6 10*3/uL (ref 1.7–7.7)
Neutrophils Relative %: 56 %
Platelet Count: 463 10*3/uL — ABNORMAL HIGH (ref 150–400)
RBC: 4.63 MIL/uL (ref 3.87–5.11)
RDW: 13.5 % (ref 11.5–15.5)
WBC Count: 4.7 10*3/uL (ref 4.0–10.5)
nRBC: 0 % (ref 0.0–0.2)

## 2023-11-25 LAB — CMP (CANCER CENTER ONLY)
ALT: 18 U/L (ref 0–44)
AST: 17 U/L (ref 15–41)
Albumin: 4.1 g/dL (ref 3.5–5.0)
Alkaline Phosphatase: 67 U/L (ref 38–126)
Anion gap: 6 (ref 5–15)
BUN: 10 mg/dL (ref 6–20)
CO2: 26 mmol/L (ref 22–32)
Calcium: 9.5 mg/dL (ref 8.9–10.3)
Chloride: 107 mmol/L (ref 98–111)
Creatinine: 0.64 mg/dL (ref 0.44–1.00)
GFR, Estimated: >60 mL/min (ref >60)
Glucose, Bld: 107 mg/dL — ABNORMAL HIGH (ref 70–99)
Potassium: 4.1 mmol/L (ref 3.5–5.1)
Sodium: 139 mmol/L (ref 135–145)
Total Bilirubin: 0.3 mg/dL (ref 0.0–1.2)
Total Protein: 7.4 g/dL (ref 6.5–8.1)

## 2023-11-25 LAB — IRON AND IRON BINDING CAPACITY (CC-WL,HP ONLY)
Iron: 63 ug/dL (ref 28–170)
Saturation Ratios: 17 % (ref 10.4–31.8)
TIBC: 365 ug/dL (ref 250–450)
UIBC: 302 ug/dL (ref 148–442)

## 2023-11-25 LAB — FERRITIN: Ferritin: 28 ng/mL (ref 11–307)

## 2023-11-25 NOTE — Progress Notes (Signed)
 Hematology and Oncology Follow Up Visit  MERTHA Hubbard [id number] 1976/06/28 48 y.o. 11/25/2023   Principle Diagnosis:  Acute unprovoked PTE of LUL, with possible smaller subsegmental PTE involving LLL; no DVT in the lower extremities - diagnosed 02/2019, hyper coag work up negative Underlying FSGS Iron deficiency anemia secondary to menorrhagia    Current Therapy:        Maintenance Xarelto 10 mg PO daily - d/c on 01/30/2021, takes only when travelling IV iron as indicated     Interim History:  Tiffany Hubbard is here today for follow-up. She is doing well and has no complaints at this time.  Some tendonitis in the left ear where she hears her heart beat when she turns her head to the left.  No fever, chills, n/v, cough, rash, dizziness, SOB, chest pain, palpitations, abdominal pain or changes in bowel or bladder habits.  No swelling, tenderness, numbness or tingling in her extremities at this time.  No falls or syncope reported.  Appetite and hydration are good. Weight is stable at 272 lbs.   ECOG Performance Status: 0 - Asymptomatic  Medications:  Allergies as of 11/25/2023   No Known Allergies      Medication List        Accurate as of November 25, 2023 10:20 AM. If you have any questions, ask your nurse or doctor.          acetaminophen 500 MG tablet Commonly known as: TYLENOL Take 1,000 mg by mouth every 6 (six) hours as needed for moderate pain.   atorvastatin 10 MG tablet Commonly known as: LIPITOR Take 10 mg by mouth daily.   cyclobenzaprine 10 MG tablet Commonly known as: FLEXERIL Take 1 tablet (10 mg total) by mouth at bedtime as needed for muscle spasms.   fluticasone 50 MCG/ACT nasal spray Commonly known as: FLONASE Place 2 sprays into both nostrils daily.   lisinopril 20 MG tablet Commonly known as: ZESTRIL Take 30 mg by mouth daily.   meloxicam 7.5 MG tablet Commonly known as: MOBIC Take 1 tablet (7.5 mg total) by mouth daily.    metoprolol tartrate 25 MG tablet Commonly known as: LOPRESSOR Take 25 mg by mouth 2 (two) times a day.   norethindrone 0.35 MG tablet Commonly known as: MICRONOR Take 1 tablet by mouth daily.   rivaroxaban 10 MG Tabs tablet Commonly known as: XARELTO Take 1 tablet (10 mg total) by mouth daily. Start 2 days prior to travel and stop the day after she gets home.   triamcinolone cream 0.1 % Commonly known as: KENALOG Apply 1 application. topically 2 (two) times daily as needed.        Allergies: No Known Allergies  Past Medical History, Surgical history, Social history, and Family History were reviewed and updated.  Review of Systems: All other 10 point review of systems is negative.   Physical Exam:  weight is 272 lb (123.4 kg). Her oral temperature is 98.6 F (37 C). Her blood pressure is 127/61 and her pulse is 78. Her respiration is 18 and oxygen saturation is 100%.   Wt Readings from Last 3 Encounters:  11/25/23 272 lb (123.4 kg)  11/20/23 271 lb (122.9 kg)  11/12/23 265 lb (120.2 kg)    Ocular: Sclerae unicteric, pupils equal, round and reactive to light Ear-nose-throat: Oropharynx clear, dentition fair Lymphatic: No cervical or supraclavicular adenopathy Lungs no rales or rhonchi, good excursion bilaterally Heart regular rate and rhythm, no murmur appreciated Abd soft, nontender, positive bowel sounds  MSK no focal spinal tenderness, no joint edema Neuro: non-focal, well-oriented, appropriate affect Breasts: Deferred   Lab Results  Component Value Date   WBC 4.7 11/25/2023   HGB 13.1 11/25/2023   HCT 40.7 11/25/2023   MCV 87.9 11/25/2023   PLT 463 (H) 11/25/2023   Lab Results  Component Value Date   FERRITIN 48 12/01/2022   IRON 56 12/01/2022   TIBC 375 12/01/2022   UIBC 319 12/01/2022   IRONPCTSAT 15 12/01/2022   Lab Results  Component Value Date   RBC 4.63 11/25/2023   Lab Results  Component Value Date   KPAFRELGTCHN 22.8 (H) 08/26/2019    LAMBDASER 13.5 08/26/2019   KAPLAMBRATIO 1.69 (H) 08/26/2019   Lab Results  Component Value Date   IGGSERUM 882 08/26/2019   IGA 167 08/26/2019   IGMSERUM 131 08/26/2019   Lab Results  Component Value Date   TOTALPROTELP 6.4 08/26/2019     Chemistry      Component Value Date/Time   NA 139 12/01/2022 1034   NA 138 10/18/2021 0000   K 4.6 12/01/2022 1034   CL 105 12/01/2022 1034   CO2 27 12/01/2022 1034   BUN 8 12/01/2022 1034   BUN 9 10/18/2021 0000   CREATININE 0.75 12/01/2022 1034   CREATININE 0.84 07/20/2020 1557   GLU 83 10/18/2021 0000      Component Value Date/Time   CALCIUM 9.7 12/01/2022 1034   ALKPHOS 69 12/01/2022 1034   AST 21 12/01/2022 1034   ALT 24 12/01/2022 1034   BILITOT 0.3 12/01/2022 1034       Impression and Plan: Tiffany Hubbard is a very pleasant 48 yo African American female with history of PE involving the LUL with possible smaller subsegmental PE involving the LLL, diagnosed 02/2019.  She has underlying FSGS which may have contributed to her developing the pulmonary emboli. Hypercoag work up was negative.  She takes Xarelto now only when travelling as prophylaxis.  Iron studies pending.  Follow-up in 6 months.   [name], NP 2/19/202510:20 AM

## 2023-12-04 ENCOUNTER — Ambulatory Visit: Payer: BC Managed Care – PPO | Admitting: Family

## 2023-12-15 ENCOUNTER — Other Ambulatory Visit: Payer: Self-pay | Admitting: Family

## 2023-12-15 DIAGNOSIS — H9312 Tinnitus, left ear: Secondary | ICD-10-CM

## 2023-12-15 DIAGNOSIS — M5416 Radiculopathy, lumbar region: Secondary | ICD-10-CM

## 2023-12-15 MED ORDER — MELOXICAM 7.5 MG PO TABS: 7.5000 mg | ORAL_TABLET | Freq: Every day | ORAL | 0 refills | Status: AC

## 2023-12-15 MED ORDER — CYCLOBENZAPRINE HCL 10 MG PO TABS: 10.0000 mg | ORAL_TABLET | Freq: Every evening | ORAL | 0 refills | Status: AC | PRN

## 2023-12-15 NOTE — Telephone Encounter (Signed)
 Last Fill: Meloxicam: 11/20/23 14 tabs/0 RF     Flexeril: 11/20/23 20 tabs/0 RF  Last OV: 11/20/23 Next OV: None Scheduled  Routing to provider for review/authorization.

## 2023-12-15 NOTE — Telephone Encounter (Signed)
 Copied from CRM [id number]. Topic: Clinical - Medication Refill >> Dec 15, 2023 11:11 AM Isabell A wrote: Most Recent Primary Care Visit:  Provider: O'SULLIVAN, MELISSA  Department: [facility]  Visit Type: ACUTE  Date: 11/20/2023  Medication: meloxicam (MOBIC) 7.5 MG tablet cyclobenzaprine (FLEXERIL) 10 MG tablet   Has the patient contacted their pharmacy? No (Agent: If no, request that the patient contact the pharmacy for the refill. If patient does not wish to contact the pharmacy document the reason why and proceed with request.) (Agent: If yes, when and what did the pharmacy advise?)  Is this the correct pharmacy for this prescription? Yes If no, delete pharmacy and type the correct one.  This is the patient's preferred pharmacy:  [id number] [place], [place] - [address] [place] [address] Phone: [phone] Fax: [phone]  Has the prescription been filled recently? Yes  Is the patient out of the medication? Yes  Has the patient been seen for an appointment in the last year OR does the patient have an upcoming appointment? Yes  Can we respond through MyChart? No  Agent: Please be advised that Rx refills may take up to 3 business days. We ask that you follow-up with your pharmacy.

## 2023-12-28 ENCOUNTER — Telehealth (INDEPENDENT_AMBULATORY_CARE_PROVIDER_SITE_OTHER): Payer: Self-pay | Admitting: Otolaryngology

## 2023-12-28 NOTE — Telephone Encounter (Signed)
 Reminder Call:  Date: 12/29/2023 Status: Sch  Time: 2:00 PM Confirmed time and location-[address] [place], [place] [address]

## 2023-12-29 ENCOUNTER — Ambulatory Visit (INDEPENDENT_AMBULATORY_CARE_PROVIDER_SITE_OTHER): Payer: BC Managed Care – PPO

## 2023-12-29 ENCOUNTER — Encounter (INDEPENDENT_AMBULATORY_CARE_PROVIDER_SITE_OTHER): Payer: Self-pay

## 2023-12-29 VITALS — Ht 66.0 in | Wt 264.0 lb

## 2023-12-29 DIAGNOSIS — H93A2 Pulsatile tinnitus, left ear: Secondary | ICD-10-CM | POA: Diagnosis not present

## 2023-12-29 DIAGNOSIS — H9312 Tinnitus, left ear: Secondary | ICD-10-CM

## 2023-12-29 DIAGNOSIS — H6122 Impacted cerumen, left ear: Secondary | ICD-10-CM

## 2023-12-29 NOTE — Progress Notes (Unsigned)
 Patient ID: Tiffany Hubbard, female   DOB: 1976/09/01, 48 y.o.   MRN: [id number]  CC: Left ear pulsatile tinnitus  HPI:  Tiffany Hubbard is a 48 y.o. female  Past Medical History:  Diagnosis Date   Anemia    Asthma    Bronchitis    Cardiac arrhythmia    Focal segmental glomerulosclerosis    GERD (gastroesophageal reflux disease)    Heart murmur    HTN (hypertension)    Pulmonary emboli (HCC)     Past Surgical History:  Procedure Laterality Date   ECTOPIC PREGNANCY SURGERY     TUBAL LIGATION      Family History  Problem Relation Age of Onset   Lung cancer Mother    Breast cancer Mother 62   Diabetes Mother    Heart disease Father 51   Hypertension Father    Colon cancer Maternal Aunt        in 59's   Cancer Maternal Aunt        spinal   Colon cancer Maternal Uncle 48   Diabetes Maternal Grandmother    Stomach cancer Maternal Grandmother    Arthritis Maternal Grandfather    Colon polyps Neg Hx    Esophageal cancer Neg Hx    Rectal cancer Neg Hx     Social History:  reports that she quit smoking about 15 years ago. Her smoking use included cigarettes. She has never used smokeless tobacco. She reports current alcohol use of about 2.0 standard drinks of alcohol per week. She reports current drug use. Drug: Marijuana.  Allergies: No Known Allergies  Prior to Admission medications   Medication Sig Start Date End Date Taking? Authorizing Provider  acetaminophen (TYLENOL) 500 MG tablet Take 1,000 mg by mouth every 6 (six) hours as needed for moderate pain.   Yes [provider]  atorvastatin (LIPITOR) 10 MG tablet Take 10 mg by mouth daily. 10/06/20  Yes [provider]  cyclobenzaprine (FLEXERIL) 10 MG tablet Take 1 tablet (10 mg total) by mouth at bedtime as needed for muscle spasms. 12/15/23  Yes Worthy Rancher B, FNP  fluticasone (FLONASE) 50 MCG/ACT nasal spray Place 2 sprays into both nostrils daily. 10/27/23  Yes Olive Bass, FNP   lisinopril (ZESTRIL) 20 MG tablet Take 30 mg by mouth daily. 08/08/19  Yes [provider]  meloxicam (MOBIC) 7.5 MG tablet Take 1 tablet (7.5 mg total) by mouth daily. 12/15/23  Yes Worthy Rancher B, FNP  metoprolol tartrate (LOPRESSOR) 25 MG tablet Take 25 mg by mouth 2 (two) times a day. 03/28/19  Yes [provider]  norethindrone (MICRONOR) 0.35 MG tablet Take 1 tablet by mouth daily. 10/11/23  Yes [provider]  rivaroxaban (XARELTO) 10 MG TABS tablet Take 1 tablet (10 mg total) by mouth daily. Start 2 days prior to travel and stop the day after she gets home. 12/01/22  Yes Erenest Blank, NP  triamcinolone cream (KENALOG) 0.1 % Apply 1 application. topically 2 (two) times daily as needed. 02/24/22  Yes Sandford Craze, NP    Height 5\' 6"  (1.676 m), weight 264 lb (119.7 kg), SpO2 94%. Exam: General: Communicates without difficulty, well nourished, no acute distress. Head: Normocephalic, no evidence injury, no tenderness, facial buttresses intact without stepoff. Face/sinus: No tenderness to palpation and percussion. Facial movement is normal and symmetric. Eyes: PERRL, EOMI. No scleral icterus, conjunctivae clear. Neuro: CN II exam reveals vision grossly intact.  No nystagmus at any point of gaze. Ears:  Auricles well formed without lesions.  Ear canals are intact without mass or lesion.  No erythema or edema is appreciated.  The TMs are intact without fluid. Nose: External evaluation reveals normal support and skin without lesions.  Dorsum is intact.  Anterior rhinoscopy reveals congested mucosa over anterior aspect of inferior turbinates and intact septum.  No purulence noted. Oral:  Oral cavity and oropharynx are intact, symmetric, without erythema or edema.  Mucosa is moist without lesions. Neck: Full range of motion without pain.  There is no significant lymphadenopathy.  No masses palpable.  Thyroid bed within normal limits to palpation.  Parotid glands and  submandibular glands equal bilaterally without mass.  Trachea is midline. Neuro:  CN 2-12 grossly intact.   Assessment:   Plan:   [name] 12/29/2023, 2:47 PM

## 2023-12-30 DIAGNOSIS — H6122 Impacted cerumen, left ear: Secondary | ICD-10-CM | POA: Insufficient documentation

## 2024-01-01 ENCOUNTER — Encounter (INDEPENDENT_AMBULATORY_CARE_PROVIDER_SITE_OTHER): Payer: Self-pay

## 2024-01-29 DIAGNOSIS — H9312 Tinnitus, left ear: Secondary | ICD-10-CM | POA: Diagnosis not present

## 2024-02-05 ENCOUNTER — Telehealth (INDEPENDENT_AMBULATORY_CARE_PROVIDER_SITE_OTHER): Payer: Self-pay | Admitting: Otolaryngology

## 2024-02-05 NOTE — Telephone Encounter (Signed)
 Patient called with questions regarding MRI results. Had Scan done at [facility] on 4/25. Would you mind giving her a call back.

## 2024-02-09 ENCOUNTER — Telehealth (INDEPENDENT_AMBULATORY_CARE_PROVIDER_SITE_OTHER): Payer: Self-pay | Admitting: Otolaryngology

## 2024-02-09 NOTE — Telephone Encounter (Signed)
 Per Dr. Darlin Hubbard, I told patient that her brain MRI was normal. The next step is CT angiography has been ordered.  Patient understood.

## 2024-03-01 ENCOUNTER — Telehealth (INDEPENDENT_AMBULATORY_CARE_PROVIDER_SITE_OTHER): Payer: Self-pay

## 2024-03-01 ENCOUNTER — Encounter (INDEPENDENT_AMBULATORY_CARE_PROVIDER_SITE_OTHER): Payer: Self-pay

## 2024-03-01 NOTE — Telephone Encounter (Signed)
 Patient left a message stating that she needed the second part of her MRI sent to [facility], she said the 1st part was approved now needed the second part sent.

## 2024-03-15 ENCOUNTER — Other Ambulatory Visit: Payer: Self-pay | Admitting: Family

## 2024-03-15 DIAGNOSIS — I10 Essential (primary) hypertension: Secondary | ICD-10-CM

## 2024-03-15 DIAGNOSIS — R590 Localized enlarged lymph nodes: Secondary | ICD-10-CM | POA: Diagnosis not present

## 2024-03-15 DIAGNOSIS — E785 Hyperlipidemia, unspecified: Secondary | ICD-10-CM

## 2024-03-15 DIAGNOSIS — H9312 Tinnitus, left ear: Secondary | ICD-10-CM | POA: Diagnosis not present

## 2024-03-15 MED ORDER — ATORVASTATIN CALCIUM 10 MG PO TABS: 10.0000 mg | ORAL_TABLET | Freq: Every day | ORAL | 0 refills | Status: DC

## 2024-03-15 MED ORDER — METOPROLOL TARTRATE 25 MG PO TABS: 25.0000 mg | ORAL_TABLET | Freq: Two times a day (BID) | ORAL | 0 refills | Status: DC

## 2024-03-15 NOTE — Telephone Encounter (Signed)
 Copied from CRM [id number]. Topic: Clinical - Medication Refill >> Mar 15, 2024 12:52 PM Abigail D wrote: Medication: atorvastatin (LIPITOR) 10 MG tablet metoprolol tartrate (LOPRESSOR) 25 MG tablet  Has the patient contacted their pharmacy? Yes, advised to contact office (Agent: If no, request that the patient contact the pharmacy for the refill. If patient does not wish to contact the pharmacy document the reason why and proceed with request.) (Agent: If yes, when and what did the pharmacy advise?)  This is the patient's preferred pharmacy:  [id number] [place], [place] - [address] [place] [address] Phone: [phone] Fax: [phone]  Is this the correct pharmacy for this prescription? Yes If no, delete pharmacy and type the correct one.   Has the prescription been filled recently? No  Is the patient out of the medication? Yes  Has the patient been seen for an appointment in the last year OR does the patient have an upcoming appointment? Yes  Can we respond through MyChart? Yes  Agent: Please be advised that Rx refills may take up to 3 business days. We ask that you follow-up with your pharmacy.

## 2024-03-23 ENCOUNTER — Telehealth (INDEPENDENT_AMBULATORY_CARE_PROVIDER_SITE_OTHER): Payer: Self-pay | Admitting: Otolaryngology

## 2024-03-23 NOTE — Telephone Encounter (Signed)
 Per Dr. Darlin Ehrlich, I told patient that her head CT/angio was normal. No intervention is recommended at this time.

## 2024-05-24 ENCOUNTER — Inpatient Hospital Stay: Payer: BC Managed Care – PPO | Attending: Family

## 2024-05-24 ENCOUNTER — Inpatient Hospital Stay: Payer: BC Managed Care – PPO | Admitting: Family

## 2024-05-25 ENCOUNTER — Telehealth: Payer: Self-pay | Admitting: Family

## 2024-05-25 NOTE — Telephone Encounter (Signed)
 Lvm for patient to return call for rescheduling missed office visit.

## 2024-06-08 ENCOUNTER — Telehealth: Payer: Self-pay | Admitting: Family

## 2024-06-08 NOTE — Telephone Encounter (Signed)
 Called to reschedule missed appt. LVM to return call for scheduling.

## 2024-06-10 ENCOUNTER — Emergency Department (HOSPITAL_COMMUNITY)
Admission: EM | Admit: 2024-06-10 | Discharge: 2024-06-10 | Disposition: A | Discharge status: Home / Self Care | Source: Ambulatory Visit | Attending: Emergency Medicine | Admitting: Emergency Medicine

## 2024-06-10 ENCOUNTER — Ambulatory Visit (HOSPITAL_BASED_OUTPATIENT_CLINIC_OR_DEPARTMENT_OTHER)
Admission: RE | Admit: 2024-06-10 | Discharge: 2024-06-10 | Disposition: A | Discharge status: Home / Self Care | Source: Ambulatory Visit | Attending: Family Medicine | Admitting: Family Medicine

## 2024-06-10 ENCOUNTER — Ambulatory Visit: Payer: Self-pay | Admitting: Family Medicine

## 2024-06-10 ENCOUNTER — Other Ambulatory Visit (HOSPITAL_BASED_OUTPATIENT_CLINIC_OR_DEPARTMENT_OTHER)

## 2024-06-10 ENCOUNTER — Ambulatory Visit: Payer: Self-pay

## 2024-06-10 ENCOUNTER — Ambulatory Visit: Admitting: Family Medicine

## 2024-06-10 ENCOUNTER — Encounter: Payer: Self-pay | Admitting: Family Medicine

## 2024-06-10 ENCOUNTER — Other Ambulatory Visit: Payer: Self-pay

## 2024-06-10 ENCOUNTER — Telehealth: Payer: Self-pay

## 2024-06-10 ENCOUNTER — Encounter (HOSPITAL_COMMUNITY): Payer: Self-pay

## 2024-06-10 ENCOUNTER — Other Ambulatory Visit: Payer: Self-pay | Admitting: Family

## 2024-06-10 ENCOUNTER — Emergency Department (HOSPITAL_COMMUNITY)

## 2024-06-10 VITALS — BP 117/61 | HR 83 | Temp 98.0°F | Ht 66.0 in | Wt 263.0 lb

## 2024-06-10 DIAGNOSIS — M87852 Other osteonecrosis, left femur: Secondary | ICD-10-CM | POA: Diagnosis not present

## 2024-06-10 DIAGNOSIS — K5900 Constipation, unspecified: Secondary | ICD-10-CM | POA: Insufficient documentation

## 2024-06-10 DIAGNOSIS — M87851 Other osteonecrosis, right femur: Secondary | ICD-10-CM | POA: Insufficient documentation

## 2024-06-10 DIAGNOSIS — Z79899 Other long term (current) drug therapy: Secondary | ICD-10-CM | POA: Insufficient documentation

## 2024-06-10 DIAGNOSIS — K439 Ventral hernia without obstruction or gangrene: Secondary | ICD-10-CM | POA: Insufficient documentation

## 2024-06-10 DIAGNOSIS — D3502 Benign neoplasm of left adrenal gland: Secondary | ICD-10-CM | POA: Diagnosis not present

## 2024-06-10 DIAGNOSIS — E785 Hyperlipidemia, unspecified: Secondary | ICD-10-CM

## 2024-06-10 DIAGNOSIS — D252 Subserosal leiomyoma of uterus: Secondary | ICD-10-CM | POA: Diagnosis not present

## 2024-06-10 DIAGNOSIS — R1084 Generalized abdominal pain: Secondary | ICD-10-CM

## 2024-06-10 DIAGNOSIS — R1033 Periumbilical pain: Secondary | ICD-10-CM | POA: Diagnosis not present

## 2024-06-10 DIAGNOSIS — M87059 Idiopathic aseptic necrosis of unspecified femur: Secondary | ICD-10-CM

## 2024-06-10 LAB — CBC WITH DIFFERENTIAL/PLATELET
Abs Immature Granulocytes: 0.02 K/uL (ref 0.00–0.07)
Basophils Absolute: 0 K/uL (ref 0.0–0.1)
Basophils Absolute: 0 K/uL (ref 0.0–0.1)
Basophils Relative: 0.9 % (ref 0.0–3.0)
Basophils Relative: 1 %
Eosinophils Absolute: 0.2 K/uL (ref 0.0–0.7)
Eosinophils Absolute: 0.2 K/uL (ref 0.0–0.5)
Eosinophils Relative: 3.9 % (ref 0.0–5.0)
Eosinophils Relative: 2 %
HCT: 37.8 % (ref 36.0–46.0)
HCT: 38.5 % (ref 36.0–46.0)
Hemoglobin: 12 g/dL (ref 12.0–15.0)
Hemoglobin: 12 g/dL (ref 12.0–15.0)
Immature Granulocytes: 0 %
Lymphocytes Relative: 24.3 % (ref 12.0–46.0)
Lymphocytes Relative: 25 %
Lymphs Abs: 1.3 K/uL (ref 0.7–4.0)
Lymphs Abs: 1.6 K/uL (ref 0.7–4.0)
MCH: 25.9 pg — ABNORMAL LOW (ref 26.0–34.0)
MCHC: 31.8 g/dL (ref 30.0–36.0)
MCHC: 31.2 g/dL (ref 30.0–36.0)
MCV: 82.8 fl (ref 78.0–100.0)
MCV: 83.2 fL (ref 80.0–100.0)
Monocytes Absolute: 0.4 K/uL (ref 0.1–1.0)
Monocytes Absolute: 0.4 K/uL (ref 0.1–1.0)
Monocytes Relative: 7.6 % (ref 3.0–12.0)
Monocytes Relative: 6 %
Neutro Abs: 3.3 K/uL (ref 1.4–7.7)
Neutro Abs: 4.2 K/uL (ref 1.7–7.7)
Neutrophils Relative %: 63.3 % (ref 43.0–77.0)
Neutrophils Relative %: 66 %
Platelets: 442 K/uL — ABNORMAL HIGH (ref 150.0–400.0)
Platelets: 448 K/uL — ABNORMAL HIGH (ref 150–400)
RBC: 4.57 Mil/uL (ref 3.87–5.11)
RBC: 4.63 MIL/uL (ref 3.87–5.11)
RDW: 16.7 % — ABNORMAL HIGH (ref 11.5–15.5)
RDW: 16.1 % — ABNORMAL HIGH (ref 11.5–15.5)
WBC: 5.3 K/uL (ref 4.0–10.5)
WBC: 6.5 K/uL (ref 4.0–10.5)
nRBC: 0 % (ref 0.0–0.2)

## 2024-06-10 LAB — COMPREHENSIVE METABOLIC PANEL WITH GFR
ALT: 23 U/L (ref 0–35)
AST: 19 U/L (ref 0–37)
Albumin: 4.1 g/dL (ref 3.5–5.2)
Alkaline Phosphatase: 70 U/L (ref 39–117)
BUN: 11 mg/dL (ref 6–23)
CO2: 28 meq/L (ref 19–32)
Calcium: 9 mg/dL (ref 8.4–10.5)
Chloride: 104 meq/L (ref 96–112)
Creatinine, Ser: 0.69 mg/dL (ref 0.40–1.20)
GFR: 101.91 mL/min (ref >60.00)
Glucose, Bld: 102 mg/dL — ABNORMAL HIGH (ref 70–99)
Potassium: 4.3 meq/L (ref 3.5–5.1)
Sodium: 140 meq/L (ref 135–145)
Total Bilirubin: 0.4 mg/dL (ref 0.2–1.2)
Total Protein: 7.3 g/dL (ref 6.0–8.3)

## 2024-06-10 LAB — I-STAT CHEM 8, ED
BUN: 10 mg/dL (ref 6–20)
Calcium, Ion: 1.23 mmol/L (ref 1.15–1.40)
Chloride: 105 mmol/L (ref 98–111)
Creatinine, Ser: 0.7 mg/dL (ref 0.44–1.00)
Glucose, Bld: 83 mg/dL (ref 70–99)
HCT: 40 % (ref 36.0–46.0)
Hemoglobin: 13.6 g/dL (ref 12.0–15.0)
Potassium: 3.6 mmol/L (ref 3.5–5.1)
Sodium: 141 mmol/L (ref 135–145)
TCO2: 21 mmol/L — ABNORMAL LOW (ref 22–32)

## 2024-06-10 MED ORDER — IOHEXOL 300 MG/ML  SOLN
100.0000 mL | Freq: Once | INTRAMUSCULAR | Status: AC | PRN
  Administered 2024-06-10: 100 mL via INTRAVENOUS

## 2024-06-10 NOTE — ED Provider Notes (Signed)
 3:26 PM Assumed care of patient from off-going team. For more details, please see note from same day.  In brief, this is a 48 y.o. female with abdominal pain and swelling, c/f strangulation of umbilical hernia. No BM since Wednesday but passing flatus and no N/V.   Plan/Dispo at time of sign-out & ED Course since sign-out: [ ]  CT  BP 117/60   Pulse 78   Temp 97.7 F (36.5 C) (Oral)   Resp 16   LMP 05/27/2024   SpO2 98%    ED Course:   Clinical Course as of 06/10/24 1732  Fri Jun 10, 2024  1646 CT ABDOMEN PELVIS W CONTRAST 1. Immediately supraumbilical hernia containing fluid and omental adipose tissue, total volume 14 cc. Mild surrounding subcutaneous stranding. The appearance of fluid raises suspicion for local inflammation potentially due to vascular compromise to the herniated adipose tissue. There is no herniated bowel. 2. Scattered air-fluid levels in nondilated loops of jejunum without substantial wall thickening, probably incidental, less likely from mild proximal enteritis. 3. Fibroid uterus including multiple subserosal fibroids. Questionable left hydrosalpinx. 4. Chronic avascular necrosis in both femoral heads. No current findings of collapse although there is some lucency along the serpentine margins of the AVN of the right femoral head. 5.  Aortic Atherosclerosis (ICD10-I70.0).   [HN]  1730 Patient with adipose containing supraumbilical hernia.  No bowel or concern for strangulated/incarcerated bowel.  Patient does have some mild tenderness palpation in the pinpoint supraumbilical region on exam.  Her CT scan also demonstrated fibroid uterus which patient was already aware of as well as a questionable left hydrosalpinx.  Patient denies any fever/chills, left lower quadrant abdominal pain, or abnormal vaginal discharge.  Very low concern for TOA in this patient.  Patient is made aware of this findings and made aware of return precautions.  Also discussed with her about her  chronic avascular necrosis of bilateral femoral heads.  She states she does deal with chronic hip pain but was unaware of this finding.  As it is chronic I advised that she follow-up in the clinic with orthopedic surgery within the next 1 to 2 weeks to discuss this finding.  She is advised to take 1-2 capfuls of MiraLAX per day for constipation.  Patient is given discharge instructions and return precautions, all questions answered to patient satisfaction. [HN]    Clinical Course User Index [HN] Franklyn Sid SAILOR, MD    Dispo: DC ------------------------------- Sid Franklyn, MD Emergency Medicine  This note was created using dictation software, which may contain spelling or grammatical errors.   Franklyn Sid SAILOR, MD 06/10/24 [phone]

## 2024-06-10 NOTE — Telephone Encounter (Signed)
 Appt scheduled

## 2024-06-10 NOTE — Progress Notes (Signed)
 Acute Office Visit  Subjective:     Patient ID: Tiffany Hubbard, female    DOB: 01/04/1976, 48 y.o.   MRN: [id number]  Chief Complaint  Patient presents with   Abdominal Pain     Patient is in today for abdominal pain.  Discussed the use of AI scribe software for clinical note transcription with the patient, who gave verbal consent to proceed.  History of Present Illness Tiffany Hubbard is a 48 year old female who presents with abdominal pain and constipation.  She experienced excruciating, cramping, and diffuse abdominal pain approximately one hour after consuming tacos on Wednesday. Initially, she thought it might be gas-related. The pain persisted through the night but improved by the next morning.  On Thursday, she noticed a tender lump above her umbilicus, which remains present. She has not had a bowel movement since the onset of symptoms on Wednesday, although she usually has two bowel movements per day. She reports passing gas and experiencing more burping than usual. No nausea, vomiting, or fever.  She feels the urge to defecate but is unable to do so, indicating constipation. She also reports a gurgling sensation in her stomach after eating on Thursday and feels slightly bloated.       All review of systems negative except what is listed in the HPI      Objective:    BP 117/61   Pulse 83   Temp 98 F (36.7 C) (Oral)   Ht 5' 6 (1.676 m)   Wt 263 lb (119.3 kg)   SpO2 100%   BMI 42.45 kg/m    Physical Exam Vitals reviewed.  Constitutional:      General: She is not in acute distress.    Appearance: She is well-developed. She is obese. She is not ill-appearing.  Abdominal:     General: Bowel sounds are normal. There is no distension.     Palpations: Abdomen is soft.     Tenderness: There is abdominal tenderness in the periumbilical area. There is no guarding. Negative signs include Murphy's sign and McBurney's sign.  Skin:    General: Skin is warm  and dry.  Neurological:     Mental Status: She is alert and oriented to person, place, and time.  Psychiatric:        Behavior: Behavior normal.            No results found for any visits on 06/10/24.      Assessment & Plan:   Problem List Items Addressed This Visit   None Visit Diagnoses       Generalized abdominal pain    -  Primary   Relevant Orders   DG Abd 1 View   US  Abdomen Complete   CBC with Differential/Platelet   Comprehensive metabolic panel with GFR      Assessment & Plan Generalized abdominal pain with abdominal wall lump, possible hernia and constipation Severe abdominal pain post-meal mostly improved by morning. Tender lump above umbilicus suggests possible hernia. Differential includes constipation. No fever, nausea, or vomiting. Passing gas. Discussed workup options with patient.  - Order abdominal x-ray for constipation - She would like to go ahead with abdominal ultrasound to evaluate lump. Hold off on CT since symptoms are not severe right now. - Labs today - Advise bland diet, avoid spicy, greasy, or fatty foods until diagnosis is clear. - Instruct to monitor for severe pain, uncontrollable vomiting, or fever and seek hospital care if she occurs. - Advise to  drink plenty of fluids. - If x-ray confirms constipation, recommend Miralax, one capful once or twice daily for 2-3 days until soft bowel movement achieved.    No orders of the defined types were placed in this encounter.   Return for - pending results or sooner if needed.  Waddell KATHEE Mon, NP

## 2024-06-10 NOTE — Telephone Encounter (Signed)
 FYI Only or Action Required?: FYI only for provider.  Patient was last seen in primary care on 11/20/2023 by Daryl Setter, NP.  Called Nurse Triage reporting Abdominal Pain.  Symptoms began 3 days ago.  Interventions attempted: Nothing.  Symptoms are: unchanged.  Triage Disposition: See Physician Within 24 Hours  Patient/caregiver understands and will follow disposition?: yes       Copied from CRM #[id number]. Topic: Clinical - Red Word Triage >> Jun 10, 2024  7:36 AM Suzen RAMAN wrote: Red Word that prompted transfer to Nurse Triage: hard lump above belly button,stomach pain, unable to pass bowels in 2 days. Reason for Disposition  [1] MODERATE pain (e.g., interferes with normal activities) AND [2] pain comes and goes (cramps) AND [3] present > 24 hours  (Exception: Pain with Vomiting or Diarrhea - see that Guideline.)  Answer Assessment - Initial Assessment Questions 1. LOCATION: Where does it hurt?      3 inches above belly button  2. RADIATION: Does the pain shoot anywhere else? (e.g., chest, back)     no 3. ONSET: When did the pain begin? (e.g., minutes, hours or days ago)      Lump yesterday   pain x 3 days  4. SUDDEN: Gradual or sudden onset?     sudden 5. PATTERN Does the pain come and go, or is it constant?     Comes and goes  6. SEVERITY: How bad is the pain?  (e.g., Scale 1-10; mild, moderate, or severe)     Tender or sore 4    8. CAUSE: What do you think is causing the stomach pain? (e.g., gallstones, recent abdominal surgery)     unsure 9. RELIEVING/AGGRAVATING FACTORS: What makes it better or worse? (e.g., antacids, bending or twisting motion, bowel movement)     Palpation of the lump 10. OTHER SYMPTOMS: Do you have any other symptoms? (e.g., back pain, diarrhea, fever, urination pain, vomiting)       Lump above belly button size of 1 inch by inch , no BM in 2 days  Protocols used: Abdominal Pain - [facility]

## 2024-06-10 NOTE — Discharge Instructions (Addendum)
 Thank you for coming to [facility] Emergency Department. You were seen for abdominal pain and concern for strangulated hernia. We did an exam, labs, and imaging, and these showed: - Fat-containing supraumbilical hernia without any strangulated bowel.  You can take Tylenol  1000 mg every 8 hours for pain or discomfort in this area and you can follow-up electively in an outpatient setting with surgery Dr. Teresa in order to discuss elective repair -Chronic avascular necrosis of the bilateral femoral heads which could cause hip pain.  Please follow-up in the orthopedic clinic with Dr. Fidel. -Fibroid uterus -you can follow-up as needed with your gynecologist  For your constipation, you can take 1-2 capfuls of MiraLAX per day.  You can also make sure you stay well-hydrated.  Please follow up with your primary care provider within 1 week.   Do not hesitate to return to the ED or call 911 if you experience: -Worsening symptoms -Nausea vomiting cystoscopy you cannot eat or drink anything - Severe hip pain and inability to walk - Worsening lower abdominal pain, fever/chills, vaginal discharge -Lightheadedness, passing out -Fevers/chills -Anything else that concerns you

## 2024-06-10 NOTE — ED Triage Notes (Signed)
 Pt was seen at [facility] for abdominal pain and found to have a strangulation hernia and told to come to the ED for emergency surgery. Pt denies any n/v/d.

## 2024-06-10 NOTE — Telephone Encounter (Signed)
 Reviewed results with provider who examined her today- her team will call patient to advise her to go to the ED.

## 2024-06-10 NOTE — ED Provider Notes (Signed)
 [place] EMERGENCY DEPARTMENT AT [facility] Provider Note   CSN: [id number] Arrival date & time: 06/10/24  1357     Patient presents with: Abdominal Pain   Tiffany Hubbard is a 48 y.o. female.   HPI     48 year old patient comes in with chief complaint of abdominal pain, nausea. Patient indicates that on Wednesday, she started having periumbilical abdominal discomfort.  Subsequently she noted a lump, followed up with PCP.  PCP did an ultrasound and patient was instructed to come to the ER for surgical evaluation.  Patient has periumbilical abdominal pain.  She has not had a bowel movement since Wednesday, but is passing flatus.  She denies any nausea or vomiting.  Prior to Admission medications   Medication Sig Start Date End Date Taking? Authorizing Provider  acetaminophen  (TYLENOL ) 500 MG tablet Take 1,000 mg by mouth every 6 (six) hours as needed for moderate pain.    [provider]  atorvastatin  (LIPITOR) 10 MG tablet Take 1 tablet (10 mg total) by mouth daily. 06/10/24   O'Sullivan, Melissa, NP  cyclobenzaprine  (FLEXERIL ) 10 MG tablet Take 1 tablet (10 mg total) by mouth at bedtime as needed for muscle spasms. 12/15/23   Webb, Padonda B, FNP  fluticasone  (FLONASE ) 50 MCG/ACT nasal spray Place 2 sprays into both nostrils daily. 10/27/23   Jason Leita Repine, FNP  lisinopril (ZESTRIL) 20 MG tablet Take 30 mg by mouth daily. 08/08/19   [provider]  meloxicam  (MOBIC ) 7.5 MG tablet Take 1 tablet (7.5 mg total) by mouth daily. 12/15/23   Webb, Padonda B, FNP  metoprolol  tartrate (LOPRESSOR ) 25 MG tablet Take 1 tablet (25 mg total) by mouth 2 (two) times daily. 03/15/24   O'Sullivan, Melissa, NP  norethindrone (MICRONOR) 0.35 MG tablet Take 1 tablet by mouth daily. 10/11/23   [provider]  rivaroxaban  (XARELTO ) 10 MG TABS tablet Take 1 tablet (10 mg total) by mouth daily. Start 2 days prior to travel and stop the day after she gets home.  12/01/22   Franchot Lauraine HERO, NP  triamcinolone  cream (KENALOG ) 0.1 % Apply 1 application. topically 2 (two) times daily as needed. 02/24/22   O'Sullivan, Melissa, NP    Allergies: Patient has no known allergies.    Review of Systems  All other systems reviewed and are negative.   Updated Vital Signs BP 117/60   Pulse 78   Temp 97.7 F (36.5 C) (Oral)   Resp 16   LMP 05/27/2024   SpO2 98%   Physical Exam Vitals and nursing note reviewed.  Constitutional:      Appearance: She is well-developed.  HENT:     Head: Atraumatic.  Cardiovascular:     Rate and Rhythm: Normal rate.  Pulmonary:     Effort: Pulmonary effort is normal.  Abdominal:     Tenderness: There is abdominal tenderness. There is no rebound.     Hernia: A hernia is present. Hernia is present in the umbilical area.  Musculoskeletal:     Cervical back: Normal range of motion and neck supple.  Skin:    General: Skin is warm and dry.  Neurological:     Mental Status: She is alert and oriented to person, place, and time.     (all labs ordered are listed, but only abnormal results are displayed) Labs Reviewed  CBC WITH DIFFERENTIAL/PLATELET - Abnormal; Notable for the following components:      Result Value   MCH 25.9 (*)  RDW 16.1 (*)    Platelets 448 (*)    All other components within normal limits  I-STAT CHEM 8, ED - Abnormal; Notable for the following components:   TCO2 21 (*)    All other components within normal limits    EKG: None  Radiology: CT ABDOMEN PELVIS W CONTRAST Result Date: 06/10/2024 CLINICAL DATA:  Abdominal pain, periumbilical hernia containing some fluid on ultrasound EXAM: CT ABDOMEN AND PELVIS WITH CONTRAST TECHNIQUE: Multidetector CT imaging of the abdomen and pelvis was performed using the standard protocol following bolus administration of intravenous contrast. RADIATION DOSE REDUCTION: This exam was performed according to the departmental dose-optimization program which includes  automated exposure control, adjustment of the mA and/or kV according to patient size and/or use of iterative reconstruction technique. CONTRAST:  OMNIPAQUE  IOHEXOL  300 MG/ML  SOLN COMPARISON:  Abdominal ultrasound 06/10/2024 and CT abdomen 03/16/2019 FINDINGS: Lower chest: Unremarkable Hepatobiliary: Unremarkable Pancreas: Unremarkable Spleen: Unremarkable Adrenals/Urinary Tract: 1.6 by 1.4 by 1.3 cm mass of the lateral limb left adrenal gland on image 93 series 8, previously shown to be a benign adenoma on CT scan from 03/16/2019. Similar small adenoma along the genu of the left adrenal gland. No further imaging workup of these lesions is indicated. Both kidneys appear unremarkable.  Urinary bladder unremarkable. Stomach/Bowel: Scattered air-fluid levels in nondilated loops of jejunum without substantial wall thickening, probably incidental, less likely from mild proximal enteritis. Mildly mobile cecum in the right upper abdomen. Normal appendix. Vascular/Lymphatic: Mild aortoiliac atheromatous vascular calcifications. Reproductive: Fibroid uterus including multiple subserosal fibroids. At least two clustered left adnexal subserosal fibroids partially obscure the left ovary. Questionable left hydrosalpinx. Other: Clip noted along the right lower quadrant omentum, image 65 series 2. Musculoskeletal: Immediately supraumbilical hernia containing fluid and omental adipose tissue. Hernia neck measures 1.2 by 1.2 cm and the overall contents including fluid and adipose tissue measure 2.9 by 3.3 by 2.8 cm (volume = 14 cm^3). There is a small amount of adjacent inflammatory stranding in the overlying subcutaneous tissues as on image 115 series 10. The appearance of fluid raises suspicion for local inflammation potentially due to vascular compromise to the herniated adipose tissue. There is no herniated bowel. Geographic chronic avascular necrosis in both femoral heads. No current findings of collapse although there is  some lucency along the serpentine margins of the AVN of the right femoral head on images 88 through 94 of series 8. IMPRESSION: 1. Immediately supraumbilical hernia containing fluid and omental adipose tissue, total volume 14 cc. Mild surrounding subcutaneous stranding. The appearance of fluid raises suspicion for local inflammation potentially due to vascular compromise to the herniated adipose tissue. There is no herniated bowel. 2. Scattered air-fluid levels in nondilated loops of jejunum without substantial wall thickening, probably incidental, less likely from mild proximal enteritis. 3. Fibroid uterus including multiple subserosal fibroids. Questionable left hydrosalpinx. 4. Chronic avascular necrosis in both femoral heads. No current findings of collapse although there is some lucency along the serpentine margins of the AVN of the right femoral head. 5.  Aortic Atherosclerosis (ICD10-I70.0). Electronically Signed   By: Ryan Salvage M.D.   On: 06/10/2024 16:36   US  Abdomen Complete Result Date: 06/10/2024 CLINICAL DATA:  gen abd pain, no BM in 2 days, pain/lump above umbilical tender EXAM: ABDOMEN ULTRASOUND COMPLETE COMPARISON:  March 16, 2019 FINDINGS: Gallbladder: No gallstones. No wall thickening or pericholecystic fluid. No sonographic Murphy's sign noted by sonographer. Common bile duct: Diameter: 3 mm Liver: Normal echogenicity. No focal lesion  identified. No intrahepatic biliary ductal dilation. Portal vein is patent on color Doppler imaging with normal direction of blood flow towards the liver. IVC: No abnormality visualized. Pancreas: Visualized portion unremarkable. Spleen: Size and appearance within normal limits. Right Kidney: Length: 10.9 cm. Normal echogenicity. No mass. No hydronephrosis or nephrolithiasis. Left Kidney: Length: 12.2 cm. Normal echogenicity. No mass. No hydronephrosis or nephrolithiasis. Abdominal aorta: No aneurysm visualized. Other findings: In the subcutaneous tissues  just above the umbilicus, there is a fluid-filled structure containing hyperechogenic material with vascular Doppler flow present. Overall, this measures 3.6 x 2.2 x 3.1 cm. IMPRESSION: In the subcutaneous tissues just above the umbilicus, there is a fluid-filled structure containing hyperechogenic material with vascular Doppler flow present. Overall, this measures 3.6 x 2.2 x 3.1 cm. This has the appearance of a fat and fluid containing ventral abdominal hernia, possibly from strangulation. Correlation with physical exam findings recommended. A follow-up CT of the abdomen and pelvis with IV contrast may be of benefit for further characterization. These results will be called to the ordering clinician or representative by the Radiologist Assistant and communication documented in the PACS or [employer]. Electronically Signed   By: Rogelia Myers M.D.   On: 06/10/2024 11:19   DG Abd 1 View Result Date: 06/10/2024 CLINICAL DATA:  Generalized abdominal pain, no bowel movement in 2 days EXAM: ABDOMEN - 1 VIEW COMPARISON:  March 16, 2019 FINDINGS: Nonobstructive bowel gas pattern.No significant formed stool visualized within the colon.No pneumoperitoneum. No organomegaly or radiopaque calculi. No acute fracture or destructive lesion. The lung bases are clear.Tubal ligation clips in the right pelvis. IMPRESSION: Nonobstructive bowel gas pattern. Electronically Signed   By: Rogelia Myers M.D.   On: 06/10/2024 09:56     Procedures   Medications Ordered in the ED  iohexol  (OMNIPAQUE ) 300 MG/ML solution 100 mL (100 mLs Intravenous Contrast Given 06/10/24 1601)    Clinical Course as of 06/11/24 0748  Fri Jun 10, 2024  1646 CT ABDOMEN PELVIS W CONTRAST 1. Immediately supraumbilical hernia containing fluid and omental adipose tissue, total volume 14 cc. Mild surrounding subcutaneous stranding. The appearance of fluid raises suspicion for local inflammation potentially due to vascular compromise to the  herniated adipose tissue. There is no herniated bowel. 2. Scattered air-fluid levels in nondilated loops of jejunum without substantial wall thickening, probably incidental, less likely from mild proximal enteritis. 3. Fibroid uterus including multiple subserosal fibroids. Questionable left hydrosalpinx. 4. Chronic avascular necrosis in both femoral heads. No current findings of collapse although there is some lucency along the serpentine margins of the AVN of the right femoral head. 5.  Aortic Atherosclerosis (ICD10-I70.0).   [HN]  1730 Patient with adipose containing supraumbilical hernia.  No bowel or concern for strangulated/incarcerated bowel.  Patient does have some mild tenderness palpation in the pinpoint supraumbilical region on exam.  Her CT scan also demonstrated fibroid uterus which patient was already aware of as well as a questionable left hydrosalpinx.  Patient denies any fever/chills, left lower quadrant abdominal pain, or abnormal vaginal discharge.  Very low concern for TOA in this patient.  Patient is made aware of this findings and made aware of return precautions.  Also discussed with her about her chronic avascular necrosis of bilateral femoral heads.  She states she does deal with chronic hip pain but was unaware of this finding.  As it is chronic I advised that she follow-up in the clinic with orthopedic surgery within the next 1 to 2 weeks to discuss  this finding.  She is advised to take 1-2 capfuls of MiraLAX per day for constipation.  Patient is given discharge instructions and return precautions, all questions answered to patient satisfaction. [HN]    Clinical Course User Index [HN] Franklyn Sid SAILOR, MD                                 Medical Decision Making Amount and/or Complexity of Data Reviewed Labs: ordered. Radiology: ordered. Decision-making details documented in ED Course.  Risk Prescription drug management.   48 year old patient comes in with chief  complaint of abdominal pain and abnormal ultrasound.  I reviewed patient's records including outpatient ultrasound, results are as below:  IMPRESSION: In the subcutaneous tissues just above the umbilicus, there is a fluid-filled structure containing hyperechogenic material with vascular Doppler flow present. Overall, this measures 3.6 x 2.2 x 3.1 cm. This has the appearance of a fat and fluid containing ventral abdominal hernia, possibly from strangulation. Correlation with physical exam findings recommended. A follow-up CT of the abdomen and pelvis with IV contrast may be of benefit for further characterization.  On exam, patient has palpable nodule with tenderness to palpation.  She denies any nausea, vomiting and has been passing flatus, therefore low suspicion for strangulation, but she does have localized tenderness.  I will get CT abdomen pelvis with contrast given that patient is concerned about strangulation, and the ultrasound will give a clear morphology and rule out obstruction.  Dispo per CT findings.  Currently patient does not want anything for pain.   Final diagnoses:  Supraumbilical hernia  Avascular necrosis of femoral head, unspecified laterality (HCC)  Subserous leiomyoma of uterus  Constipation, unspecified constipation type    ED Discharge Orders     None          Charlyn Sora, MD 06/11/24 [phone]

## 2024-06-10 NOTE — Telephone Encounter (Signed)
 Received call from radiology on critical findings on US  for patient

## 2024-07-05 ENCOUNTER — Inpatient Hospital Stay: Admitting: Family

## 2024-07-05 ENCOUNTER — Inpatient Hospital Stay: Attending: Family

## 2024-07-06 ENCOUNTER — Ambulatory Visit (HOSPITAL_BASED_OUTPATIENT_CLINIC_OR_DEPARTMENT_OTHER): Admitting: Orthopaedic Surgery

## 2024-07-20 DIAGNOSIS — M25552 Pain in left hip: Secondary | ICD-10-CM | POA: Diagnosis not present

## 2024-07-20 DIAGNOSIS — M25551 Pain in right hip: Secondary | ICD-10-CM | POA: Diagnosis not present

## 2024-07-22 ENCOUNTER — Other Ambulatory Visit: Payer: Self-pay | Admitting: Obstetrics and Gynecology

## 2024-07-22 DIAGNOSIS — R928 Other abnormal and inconclusive findings on diagnostic imaging of breast: Secondary | ICD-10-CM

## 2024-07-30 ENCOUNTER — Ambulatory Visit
Admission: RE | Admit: 2024-07-30 | Discharge: 2024-07-30 | Disposition: A | Source: Ambulatory Visit | Attending: Obstetrics and Gynecology

## 2024-07-30 DIAGNOSIS — N6315 Unspecified lump in the right breast, overlapping quadrants: Secondary | ICD-10-CM | POA: Diagnosis not present

## 2024-07-30 DIAGNOSIS — R928 Other abnormal and inconclusive findings on diagnostic imaging of breast: Secondary | ICD-10-CM

## 2024-08-01 ENCOUNTER — Other Ambulatory Visit: Payer: Self-pay | Admitting: Obstetrics and Gynecology

## 2024-08-01 DIAGNOSIS — R928 Other abnormal and inconclusive findings on diagnostic imaging of breast: Secondary | ICD-10-CM

## 2024-08-08 ENCOUNTER — Encounter: Payer: Self-pay | Admitting: Radiology

## 2024-09-11 ENCOUNTER — Other Ambulatory Visit: Payer: Self-pay | Admitting: Family

## 2024-09-11 DIAGNOSIS — I10 Essential (primary) hypertension: Secondary | ICD-10-CM

## 2024-09-11 DIAGNOSIS — E785 Hyperlipidemia, unspecified: Secondary | ICD-10-CM

## 2024-09-11 NOTE — Telephone Encounter (Signed)
 Please contact pt to schedule follow up.

## 2024-09-12 NOTE — Telephone Encounter (Signed)
 Called patient and schedule for 10/21/24

## 2024-09-20 DIAGNOSIS — H1011 Acute atopic conjunctivitis, right eye: Secondary | ICD-10-CM | POA: Diagnosis not present

## 2024-09-23 ENCOUNTER — Telehealth: Payer: Self-pay

## 2024-09-23 DIAGNOSIS — M545 Low back pain, unspecified: Secondary | ICD-10-CM

## 2024-09-23 NOTE — Addendum Note (Signed)
 Addended by: DARYL SETTER on: 09/23/2024 12:16 PM   Modules accepted: Orders

## 2024-09-23 NOTE — Telephone Encounter (Signed)
 Copied from CRM #[id number]. Topic: Referral - Question >> Sep 22, 2024  2:39 PM Tiffany Hubbard wrote: Reason for CRM: Patient has lower back pains that have progressively worsened over the past 9 months, wants referral to neurosurgery. Did see PCP for back pain but thought it would lessen over time. PCP does not have any openings until the end of Jan which patient has an appt for. Would PCP be willing to do a referral without an appt or would she require an additional appt? If another appt is required could we work her into the schedule sooner please. Prefers afternoons if possible  Patient callback is [phone] (home)  Routed to [facility] in error

## 2024-10-12 ENCOUNTER — Ambulatory Visit: Admitting: Physician Assistant

## 2024-10-21 ENCOUNTER — Ambulatory Visit: Admitting: Family

## 2024-10-26 ENCOUNTER — Ambulatory Visit: Admitting: Family

## 2024-10-28 ENCOUNTER — Ambulatory Visit: Admitting: Family

## 2024-11-01 ENCOUNTER — Ambulatory Visit: Admitting: Family

## 2024-12-02 ENCOUNTER — Ambulatory Visit: Admitting: Family

## 2025-01-30 ENCOUNTER — Encounter

## 2025-01-30 ENCOUNTER — Other Ambulatory Visit
